# Patient Record
Sex: Female | Born: 1975 | Race: White | Hispanic: No | Marital: Married | Smoking: Former smoker
Health system: Southern US, Community
[De-identification: ages and names within clinical notes are randomized; demographics above are authoritative.]

## PROBLEM LIST (undated history)

## (undated) DIAGNOSIS — E7800 Pure hypercholesterolemia, unspecified: Secondary | ICD-10-CM

## (undated) DIAGNOSIS — T8859XA Other complications of anesthesia, initial encounter: Secondary | ICD-10-CM

## (undated) DIAGNOSIS — I10 Essential (primary) hypertension: Secondary | ICD-10-CM

## (undated) DIAGNOSIS — N760 Acute vaginitis: Secondary | ICD-10-CM

## (undated) DIAGNOSIS — Z889 Allergy status to unspecified drugs, medicaments and biological substances status: Secondary | ICD-10-CM

## (undated) DIAGNOSIS — F909 Attention-deficit hyperactivity disorder, unspecified type: Secondary | ICD-10-CM

## (undated) DIAGNOSIS — Z5189 Encounter for other specified aftercare: Secondary | ICD-10-CM

## (undated) DIAGNOSIS — J45909 Unspecified asthma, uncomplicated: Secondary | ICD-10-CM

## (undated) DIAGNOSIS — D649 Anemia, unspecified: Secondary | ICD-10-CM

## (undated) DIAGNOSIS — I7300 Raynaud's syndrome without gangrene: Secondary | ICD-10-CM

## (undated) DIAGNOSIS — T4145XA Adverse effect of unspecified anesthetic, initial encounter: Secondary | ICD-10-CM

## (undated) DIAGNOSIS — T7840XA Allergy, unspecified, initial encounter: Secondary | ICD-10-CM

## (undated) DIAGNOSIS — M797 Fibromyalgia: Secondary | ICD-10-CM

## (undated) DIAGNOSIS — R52 Pain, unspecified: Secondary | ICD-10-CM

## (undated) DIAGNOSIS — R011 Cardiac murmur, unspecified: Secondary | ICD-10-CM

## (undated) DIAGNOSIS — B9689 Other specified bacterial agents as the cause of diseases classified elsewhere: Secondary | ICD-10-CM

## (undated) DIAGNOSIS — L739 Follicular disorder, unspecified: Secondary | ICD-10-CM

## (undated) DIAGNOSIS — R6882 Decreased libido: Secondary | ICD-10-CM

## (undated) DIAGNOSIS — Z309 Encounter for contraceptive management, unspecified: Secondary | ICD-10-CM

## (undated) DIAGNOSIS — E669 Obesity, unspecified: Secondary | ICD-10-CM

## (undated) DIAGNOSIS — D8940 Mast cell activation, unspecified: Secondary | ICD-10-CM

## (undated) DIAGNOSIS — N898 Other specified noninflammatory disorders of vagina: Secondary | ICD-10-CM

## (undated) DIAGNOSIS — G43909 Migraine, unspecified, not intractable, without status migrainosus: Secondary | ICD-10-CM

## (undated) HISTORY — DX: Fibromyalgia: M79.7

## (undated) HISTORY — DX: Acute vaginitis: N76.0

## (undated) HISTORY — DX: Unspecified asthma, uncomplicated: J45.909

## (undated) HISTORY — DX: Mast cell activation, unspecified: D89.40

## (undated) HISTORY — DX: Encounter for contraceptive management, unspecified: Z30.9

## (undated) HISTORY — DX: Pure hypercholesterolemia, unspecified: E78.00

## (undated) HISTORY — DX: Migraine, unspecified, not intractable, without status migrainosus: G43.909

## (undated) HISTORY — DX: Allergy, unspecified, initial encounter: T78.40XA

## (undated) HISTORY — PX: BREAST EXCISIONAL BIOPSY: SUR124

## (undated) HISTORY — DX: Allergy status to unspecified drugs, medicaments and biological substances: Z88.9

## (undated) HISTORY — DX: Encounter for other specified aftercare: Z51.89

## (undated) HISTORY — DX: Cardiac murmur, unspecified: R01.1

## (undated) HISTORY — DX: Pain, unspecified: R52

## (undated) HISTORY — DX: Raynaud's syndrome without gangrene: I73.00

## (undated) HISTORY — DX: Decreased libido: R68.82

## (undated) HISTORY — DX: Obesity, unspecified: E66.9

## (undated) HISTORY — DX: Attention-deficit hyperactivity disorder, unspecified type: F90.9

## (undated) HISTORY — PX: BREAST LUMPECTOMY: SHX2

## (undated) HISTORY — DX: Other specified noninflammatory disorders of vagina: N89.8

## (undated) HISTORY — PX: KNEE ARTHROSCOPY: SUR90

## (undated) HISTORY — DX: Anemia, unspecified: D64.9

## (undated) HISTORY — DX: Follicular disorder, unspecified: L73.9

## (undated) HISTORY — DX: Other specified bacterial agents as the cause of diseases classified elsewhere: B96.89

## (undated) HISTORY — DX: Essential (primary) hypertension: I10

---

## 1997-09-24 ENCOUNTER — Ambulatory Visit (HOSPITAL_COMMUNITY): Admission: RE | Admit: 1997-09-24 | Discharge: 1997-09-24 | Payer: Self-pay | Admitting: *Deleted

## 2004-11-09 ENCOUNTER — Ambulatory Visit: Payer: Self-pay | Admitting: Orthopedic Surgery

## 2005-06-04 ENCOUNTER — Ambulatory Visit (HOSPITAL_COMMUNITY): Admission: RE | Admit: 2005-06-04 | Discharge: 2005-06-04 | Payer: Self-pay | Admitting: Family Medicine

## 2006-03-19 ENCOUNTER — Ambulatory Visit (HOSPITAL_COMMUNITY): Admission: RE | Admit: 2006-03-19 | Discharge: 2006-03-19 | Payer: Self-pay | Admitting: Family Medicine

## 2008-12-19 ENCOUNTER — Emergency Department (HOSPITAL_COMMUNITY): Admission: EM | Admit: 2008-12-19 | Discharge: 2008-12-19 | Payer: Self-pay | Admitting: Emergency Medicine

## 2009-05-21 HISTORY — PX: DILATION AND CURETTAGE OF UTERUS: SHX78

## 2009-10-09 ENCOUNTER — Inpatient Hospital Stay (HOSPITAL_COMMUNITY): Admission: AD | Admit: 2009-10-09 | Discharge: 2009-10-12 | Payer: Self-pay | Admitting: Obstetrics & Gynecology

## 2009-10-09 ENCOUNTER — Ambulatory Visit: Payer: Self-pay | Admitting: Obstetrics and Gynecology

## 2009-10-10 ENCOUNTER — Encounter: Payer: Self-pay | Admitting: Obstetrics & Gynecology

## 2010-08-07 LAB — RAPID URINE DRUG SCREEN, HOSP PERFORMED
Amphetamines: NOT DETECTED
Cocaine: NOT DETECTED
Tetrahydrocannabinol: NOT DETECTED

## 2010-08-07 LAB — CROSSMATCH

## 2010-08-07 LAB — COMPREHENSIVE METABOLIC PANEL
AST: 20 U/L (ref 0–37)
Albumin: 2.9 g/dL — ABNORMAL LOW (ref 3.5–5.2)
Alkaline Phosphatase: 94 U/L (ref 39–117)
Chloride: 106 mEq/L (ref 96–112)
GFR calc Af Amer: >60 mL/min (ref >60)
Potassium: 3.8 mEq/L (ref 3.5–5.1)
Total Bilirubin: 0.5 mg/dL (ref 0.3–1.2)

## 2010-08-07 LAB — CBC
HCT: 33.8 % — ABNORMAL LOW (ref 36.0–46.0)
HCT: 34.4 % — ABNORMAL LOW (ref 36.0–46.0)
Hemoglobin: 11.8 g/dL — ABNORMAL LOW (ref 12.0–15.0)
Hemoglobin: 11.9 g/dL — ABNORMAL LOW (ref 12.0–15.0)
Hemoglobin: 8.9 g/dL — ABNORMAL LOW (ref 12.0–15.0)
Hemoglobin: 9.3 g/dL — ABNORMAL LOW (ref 12.0–15.0)
MCHC: 34.1 g/dL (ref 30.0–36.0)
MCHC: 35 g/dL (ref 30.0–36.0)
MCV: 89.6 fL (ref 78.0–100.0)
MCV: 89.8 fL (ref 78.0–100.0)
Platelets: 130 10*3/uL — ABNORMAL LOW (ref 150–400)
Platelets: 136 10*3/uL — ABNORMAL LOW (ref 150–400)
RBC: 2.83 MIL/uL — ABNORMAL LOW (ref 3.87–5.11)
RBC: 3.04 MIL/uL — ABNORMAL LOW (ref 3.87–5.11)
RBC: 3.85 MIL/uL — ABNORMAL LOW (ref 3.87–5.11)
RDW: 14.2 % (ref 11.5–15.5)
WBC: 10.2 10*3/uL (ref 4.0–10.5)
WBC: 12.6 10*3/uL — ABNORMAL HIGH (ref 4.0–10.5)
WBC: 15.7 10*3/uL — ABNORMAL HIGH (ref 4.0–10.5)
WBC: 9.2 10*3/uL (ref 4.0–10.5)

## 2010-08-07 LAB — URINE MICROSCOPIC-ADD ON

## 2010-08-07 LAB — URINALYSIS, ROUTINE W REFLEX MICROSCOPIC
Leukocytes, UA: NEGATIVE
Nitrite: NEGATIVE
Specific Gravity, Urine: 1.01 (ref 1.005–1.030)
pH: 6.5 (ref 5.0–8.0)

## 2010-08-07 LAB — MAGNESIUM: Magnesium: 4.7 mg/dL — ABNORMAL HIGH (ref 1.5–2.5)

## 2010-08-07 LAB — RPR: RPR Ser Ql: NONREACTIVE

## 2011-08-20 ENCOUNTER — Other Ambulatory Visit (HOSPITAL_COMMUNITY)
Admission: RE | Admit: 2011-08-20 | Discharge: 2011-08-20 | Disposition: A | Discharge status: Home / Self Care | Payer: BC Managed Care – PPO | Source: Ambulatory Visit | Attending: Obstetrics and Gynecology | Admitting: Obstetrics and Gynecology

## 2011-08-20 ENCOUNTER — Other Ambulatory Visit: Payer: Self-pay | Admitting: Adult Health

## 2011-08-20 DIAGNOSIS — Z01419 Encounter for gynecological examination (general) (routine) without abnormal findings: Secondary | ICD-10-CM | POA: Insufficient documentation

## 2011-08-23 ENCOUNTER — Telehealth (HOSPITAL_COMMUNITY): Payer: Self-pay | Admitting: Dietician

## 2011-08-23 NOTE — Telephone Encounter (Signed)
Received referral from Dr. Neale Burly ([facility]) for dx: obesity on 08/23/11.

## 2011-08-24 NOTE — Telephone Encounter (Signed)
Appointment scheduled for 08/30/11 at 4:00 PM. Sent appointment confirmation to pt home via [place] [facility].

## 2011-08-30 ENCOUNTER — Encounter (HOSPITAL_COMMUNITY): Payer: Self-pay | Admitting: Dietician

## 2011-08-30 DIAGNOSIS — G43909 Migraine, unspecified, not intractable, without status migrainosus: Secondary | ICD-10-CM | POA: Insufficient documentation

## 2011-08-30 DIAGNOSIS — I10 Essential (primary) hypertension: Secondary | ICD-10-CM | POA: Insufficient documentation

## 2011-08-30 DIAGNOSIS — Z889 Allergy status to unspecified drugs, medicaments and biological substances status: Secondary | ICD-10-CM | POA: Insufficient documentation

## 2011-08-30 NOTE — Progress Notes (Signed)
Outpatient Initial Nutrition Assessment  Date:08/30/2011   Time: 4:00 PM  Referring Physician: Dr. Neale Burly ([facility]) Reason for Visit: obesity/ weight loss  Nutrition Assessment:  Height: 5\' 4"  (162.6 cm)   Weight: 175 lb (79.379 kg)   IBW: 120# %IBW: 146% UBW: 145#  %UBW: 121% Body mass index is 30.04 kg/(m^2).  Goal Weight: 145# Weight hx: Pt reports maintained a weight of 145# before becoming pregnant with her son, who is now 31 years old. Her highest weight was 195# at delivery of her son. After his birth, she has been able to maintain a weight of 165-175#, but is frustrated with her lack of weight loss despite interventions. 5 years ago she reports she was 125#, and slowly gain up to 150#.   Estimated nutritional needs: 1692-1846 kcals daily, 64-80 grams protein daily, 1.7-1.8 L fluid daily  PMH:  Past Medical History  Diagnosis Date  . Migraines   . HTN (hypertension)   . Multiple allergies     Medications:  Current Outpatient Rx  Name Route Sig Dispense Refill  . DEPO-PROVERA IM Intramuscular Inject into the muscle.    . ANTIHISTAMINE PO Oral Take by mouth.    Marland Kitchen VYVANSE PO Oral Take by mouth.    . METOCLOPRAMIDE HCL 10 MG PO TABS Oral Take 10 mg by mouth as needed.    Marland Kitchen ZONISAMIDE PO Oral Take by mouth.      Labs: CMP     Component Value Date/Time   NA 135 10/09/2009 1039   K 3.8 10/09/2009 1039   CL 106 10/09/2009 1039   CO2 22 10/09/2009 1039   GLUCOSE 90 10/09/2009 1039   BUN 7 10/09/2009 1039   CREATININE 0.55 10/09/2009 1039   CALCIUM 8.9 10/09/2009 1039   PROT 6.1 10/09/2009 1039   ALBUMIN 2.9* 10/09/2009 1039   AST 20 10/09/2009 1039   ALT 13 10/09/2009 1039   ALKPHOS 94 10/09/2009 1039   BILITOT 0.5 10/09/2009 1039   GFRNONAA >60 10/09/2009 1039   GFRAA  Value: >60        The eGFR has been calculated using the MDRD equation. This calculation has not been validated in all clinical situations. eGFR's persistently <60 mL/min signify possible Chronic  Kidney Disease. 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 She is married, but her husband is currently stationed in [place] for his job. Her family lives nearby. She reports her stress level at a 7, citing, work, family, and trying to juggle all her and her children's commitments while her husband is away as her main sources of stress. She is looking forward to the summer, as school will be out and her husband will be returning in a couple of weeks, which she reports will make her life less hectic. She copes with stress by spending time with her family.  Nutrition hx/habits: Ms. Rozycki desires weight loss. She reports she knows what to do and does not want to be told "the same things everyone else has told me". She reports 5 years ago she had chronic hives and discovered that she has multiple allergies (pitted fruits, tree  nuts, shellfish, coconut, aloe, dust, keflex, molds, percodan). She reports that she has learned to manage with these allergies, although it can be trying when she eats out. She reports that she was on Prednisone for 18 months and that was when she gained the bulk of her weight. She has always been active, doing yard work, Architect, Armed forces technical officer, but admits that she has not been exercising since her son was born, due to her schedule. She reports that she has ADHD and, due to her schedule, if she does an evening workout, she will be up all night. She complains that she gets 5 hours of sleep at the most. She reports that spending time with her family is more of a priority now. She drinks 1 soda daily and drinks water throughout the day. She likes sweets, but  has cut back. She orders take out once a week and does the majority of the cooking in the household. She refuses to complete a food diary, saying "that won't work for me; my brain is already going a million miles an hour and I don't want anything to add to it". She does not want to deprive herself of foods. She thinks that she would benefit more from a chef than an RD, as she really would like to learn how to cook new recipes and attend cooking classes.   Diet recall: 5 Am: water OR orange juice; 5:30-7:30: sipping on Coca-Cola; 9:45 AM: breakfast bar OR dried fruit strip; Lunch (10:15 AM): salad with fresh veggies and hard boiled egg OR healthy choice meal OR leftover pizza; Snack (1:20 PM): peanuts, fruit strip, bag of chips, Chobani yogurt; Dinner (5-6 PM): hamburgers (2), sweet potato fries; Snack: peanuts  Nutrition Diagnosis: Involuntary weight gain r/t disordered eating pattern, physical inactivity AEB 20.7% weight gain from UBW.   Nutrition Intervention: Nutrition rx: 1200 kcal NAS, no added sugar diet; 3 meals per day; 3 snacks per day; low calorie beverages only; 30 minutes physical activity daily  Education/Counseling Provided: Most of the visit was spent on strategies for behavior change. Discussed healthier snack options. Discussed ways to make water more appetizing by adding fruit slices or low calorie powdered drink mix. Discussed important of incorporating exercise to assist with weight loss. Discussed seeking out a support system. Discussed slow, moderate weight loss (1-2# per week). Encouraged pt to seek out community resources re: cooking classes. Encouraged pt to take advantage of time off during the summer and when her husband is home to develop healthier lifestyle habits.   Understanding, Motivation, Ability to Follow Recommendations: Expect fair compliance.   Monitoring and Evaluation: Goals: 1)1-2# weight loss per week; 2) 30 minutes physical activity daily  Recommendations:  1) For weight loss: 1192-1346 kcals daily; 2) Take advantage of time off during the summer to adapt healthier lifestyle habits; 3) Seek out community resources re: cooking classes. Will inform of upcoming cooking class at [facility].   F/U: PRN. Provided RD contact information.  Orlene Plum, [place]  08/30/2011  Time: 4:00 PM

## 2012-08-18 ENCOUNTER — Other Ambulatory Visit: Payer: Self-pay | Admitting: Adult Health

## 2012-11-10 ENCOUNTER — Ambulatory Visit (INDEPENDENT_AMBULATORY_CARE_PROVIDER_SITE_OTHER): Payer: BC Managed Care – PPO | Admitting: Nurse Practitioner

## 2012-11-10 ENCOUNTER — Other Ambulatory Visit: Payer: Self-pay | Admitting: *Deleted

## 2012-11-10 ENCOUNTER — Encounter: Payer: Self-pay | Admitting: Nurse Practitioner

## 2012-11-10 VITALS — BP 116/82 | HR 80 | Temp 98.6°F | Wt 186.0 lb

## 2012-11-10 DIAGNOSIS — M5431 Sciatica, right side: Secondary | ICD-10-CM

## 2012-11-10 DIAGNOSIS — M5430 Sciatica, unspecified side: Secondary | ICD-10-CM

## 2012-11-10 MED ORDER — PREDNISONE (PAK) 10 MG PO TABS: 10.0000 mg | ORAL_TABLET | Freq: Every day | ORAL | Status: DC

## 2012-11-10 MED ORDER — MOMETASONE FUROATE 0.1 % EX CREA: TOPICAL_CREAM | Freq: Every day | CUTANEOUS | Status: DC

## 2012-11-10 MED ORDER — ALBUTEROL SULFATE HFA 108 (90 BASE) MCG/ACT IN AERS: 2.0000 | INHALATION_SPRAY | Freq: Four times a day (QID) | RESPIRATORY_TRACT | Status: DC | PRN

## 2012-11-10 MED ORDER — EPINEPHRINE 0.3 MG/0.3ML IJ SOAJ: 0.3000 mg | Freq: Once | INTRAMUSCULAR | Status: DC

## 2012-11-10 NOTE — Patient Instructions (Signed)
Sciatica °Sciatica is pain, weakness, numbness, or tingling along the path of the sciatic nerve. The nerve starts in the lower back and runs down the back of each leg. The nerve controls the muscles in the lower leg and in the back of the knee, while also providing sensation to the back of the thigh, lower leg, and the sole of your foot. Sciatica is a symptom of another medical condition. For instance, nerve damage or certain conditions, such as a herniated disk or bone spur on the spine, pinch or put pressure on the sciatic nerve. This causes the pain, weakness, or other sensations normally associated with sciatica. Generally, sciatica only affects one side of the body. °CAUSES  °· Herniated or slipped disc. °· Degenerative disk disease. °· A pain disorder involving the narrow muscle in the buttocks (piriformis syndrome). °· Pelvic injury or fracture. °· Pregnancy. °· Tumor (rare). °SYMPTOMS  °Symptoms can vary from mild to very severe. The symptoms usually travel from the low back to the buttocks and down the back of the leg. Symptoms can include: °· Mild tingling or dull aches in the lower back, leg, or hip. °· Numbness in the back of the calf or sole of the foot. °· Burning sensations in the lower back, leg, or hip. °· Sharp pains in the lower back, leg, or hip. °· Leg weakness. °· Severe back pain inhibiting movement. °These symptoms may get worse with coughing, sneezing, laughing, or prolonged sitting or standing. Also, being overweight may worsen symptoms. °DIAGNOSIS  °Your caregiver will perform a physical exam to look for common symptoms of sciatica. He or she may ask you to do certain movements or activities that would trigger sciatic nerve pain. Other tests may be performed to find the cause of the sciatica. These may include: °· Blood tests. °· X-rays. °· Imaging tests, such as an MRI or CT scan. °TREATMENT  °Treatment is directed at the cause of the sciatic pain. Sometimes, treatment is not necessary  and the pain and discomfort goes away on its own. If treatment is needed, your caregiver may suggest: °· Over-the-counter medicines to relieve pain. °· Prescription medicines, such as anti-inflammatory medicine, muscle relaxants, or narcotics. °· Applying heat or ice to the painful area. °· Steroid injections to lessen pain, irritation, and inflammation around the nerve. °· Reducing activity during periods of pain. °· Exercising and stretching to strengthen your abdomen and improve flexibility of your spine. Your caregiver may suggest losing weight if the extra weight makes the back pain worse. °· Physical therapy. °· Surgery to eliminate what is pressing or pinching the nerve, such as a bone spur or part of a herniated disk. °HOME CARE INSTRUCTIONS  °· Only take over-the-counter or prescription medicines for pain or discomfort as directed by your caregiver. °· Apply ice to the affected area for 20 minutes, 3 4 times a day for the first 48 72 hours. Then try heat in the same way. °· Exercise, stretch, or perform your usual activities if these do not aggravate your pain. °· Attend physical therapy sessions as directed by your caregiver. °· Keep all follow-up appointments as directed by your caregiver. °· Do not wear high heels or shoes that do not provide proper support. °· Check your mattress to see if it is too soft. A firm mattress may lessen your pain and discomfort. °SEEK IMMEDIATE MEDICAL CARE IF:  °· You lose control of your bowel or bladder (incontinence). °· You have increasing weakness in the lower back,   pelvis, buttocks, or legs. °· You have redness or swelling of your back. °· You have a burning sensation when you urinate. °· You have pain that gets worse when you lie down or awakens you at night. °· Your pain is worse than you have experienced in the past. °· Your pain is lasting longer than 4 weeks. °· You are suddenly losing weight without reason. °MAKE SURE YOU: °· Understand these  instructions. °· Will watch your condition. °· Will get help right away if you are not doing well or get worse. °Document Released: 05/01/2001 Document Revised: 11/06/2011 Document Reviewed: 09/16/2011 °ExitCare® Patient Information ©2014 [employer]. ° °

## 2012-11-10 NOTE — Progress Notes (Signed)
  Subjective:    Patient ID: Deanna Friedman, female    DOB: Sep 24, 1975, 37 y.o.   MRN: [id number]  HPI Patient in C/O right big toe numbness- Started out as tightness in the heeel and progressed to numbness to toe- No pain in foot- Having tenderness in lower back. Pain radiating to butt cheek and down thigh.  No back pain with movement.    Review of Systems  All other systems reviewed and are negative.       Objective:   Physical Exam  Constitutional: She appears well-developed and well-nourished.  Cardiovascular: Normal rate and normal heart sounds.   Pulmonary/Chest: Effort normal and breath sounds normal.  Musculoskeletal:  FROM of hip knee and ankle right side without pain. FROM of lumbar spine without pain. (-) SLR bil  Neurological: She has normal reflexes.    BP 116/82  Pulse 80  Temp(Src) 98.6 F (37 C) (Oral)  Wt 186 lb (84.369 kg)  BMI 31.91 kg/m2       Assessment & Plan:  1. Sciatica of right side without back pain Moist heat to back Rest No heavy lifting - predniSONE (STERAPRED UNI-PAK) 10 MG tablet; Take 1 tablet (10 mg total) by mouth daily. As directed X 6 days  Dispense: 211 tablet; Refill: 0  Meds ordered this encounter  Medications  . losartan-hydrochlorothiazide (HYZAAR) 50-12.5 MG per tablet    Sig: Take 1 tablet by mouth daily.   . traMADol (ULTRAM) 50 MG tablet    Sig: Take 50 mg by mouth every 6 (six) hours as needed for pain.  Marland Kitchen ibuprofen (ADVIL,MOTRIN) 800 MG tablet    Sig: Take 800 mg by mouth every 8 (eight) hours as needed for pain.  . predniSONE (STERAPRED UNI-PAK) 10 MG tablet    Sig: Take 1 tablet (10 mg total) by mouth daily. As directed X 6 days    Dispense:  211 tablet    Refill:  0    Order Specific Question:  Supervising Provider    Answer:  Ernestina Penna [[id number]]  . EPINEPHrine (EPIPEN) 0.3 mg/0.3 mL DEVI    Sig: Inject 0.3 mLs (0.3 mg total) into the muscle once.    Dispense:  1 Device    Refill:  2    Order Specific  Question:  Supervising Provider    Answer:  Ernestina Penna [[id number]]  . albuterol (PROVENTIL HFA;VENTOLIN HFA) 108 (90 BASE) MCG/ACT inhaler    Sig: Inhale 2 puffs into the lungs every 6 (six) hours as needed for wheezing.    Dispense:  1 Inhaler    Refill:  2    Order Specific Question:  Supervising Provider    Answer:  Ernestina Penna [[id number]]  . mometasone (ELOCON) 0.1 % cream    Sig: Apply topically daily.    Dispense:  45 g    Refill:  1    Order Specific Question:  Supervising Provider    Answer:  Ernestina Penna [[id number]]   RTO prn Mary-Margaret Daphine Deutscher, FNP

## 2012-11-11 MED ORDER — MEDROXYPROGESTERONE ACETATE 150 MG/ML IM SUSP: 150.0000 mg | INTRAMUSCULAR | Status: DC

## 2012-11-17 ENCOUNTER — Telehealth: Payer: Self-pay | Admitting: Nurse Practitioner

## 2012-11-17 NOTE — Telephone Encounter (Signed)
Mmm to address 

## 2012-11-17 NOTE — Telephone Encounter (Signed)
Nothing else can do other than send to ortho to have nerve conduction study

## 2012-11-17 NOTE — Telephone Encounter (Signed)
Pt wants to wait a bit before going to ortho,(alot of cost involved).

## 2012-12-02 ENCOUNTER — Ambulatory Visit (INDEPENDENT_AMBULATORY_CARE_PROVIDER_SITE_OTHER): Payer: BC Managed Care – PPO | Admitting: Women's Health

## 2012-12-02 ENCOUNTER — Encounter: Payer: Self-pay | Admitting: Women's Health

## 2012-12-02 VITALS — BP 110/80 | Ht 63.0 in | Wt 189.5 lb

## 2012-12-02 DIAGNOSIS — L259 Unspecified contact dermatitis, unspecified cause: Secondary | ICD-10-CM

## 2012-12-02 MED ORDER — PREDNISONE 10 MG PO TABS: 40.0000 mg | ORAL_TABLET | Freq: Every day | ORAL | Status: DC

## 2012-12-02 NOTE — Progress Notes (Signed)
Subjective:     Patient ID: Deanna Friedman, female   DOB: 29-Dec-1975, 37 y.o.   MRN: [id number]  HPI Deanna Friedman is a 37 y.o. female who presents w/ report of generalized fine pruritic rash x few days since using shea butter lotion at school- reports it is also exacerbating her eczema. OTC relief measures not helping. Has multiple allergies, and states she generally has to take prednisone at this point. Denies wheezing or sob.   Review of Systems Pertinent +/- as above   Objective:   Physical Exam BP 110/80  Ht 5\' 3"  (1.6 m)  Wt 189 lb 8 oz (85.957 kg)  BMI 33.58 kg/m2  Generalized fine rash on bilateral arms  Assessment:  Contact dermatitis Multiple allergies Plan:  Discussed w/ JAG Prednisone 40mg  daily x 10d F/U prn  Deanna Friedman, CNM 12/02/2012 3:58 PM

## 2013-02-06 ENCOUNTER — Telehealth: Payer: Self-pay | Admitting: Nurse Practitioner

## 2013-02-06 NOTE — Telephone Encounter (Signed)
Pt notified that her last shot was in 1994.  She was cut at work (school).  She is going to have school nurse give her shot.

## 2013-02-16 ENCOUNTER — Other Ambulatory Visit: Payer: Self-pay | Admitting: Adult Health

## 2013-02-16 ENCOUNTER — Other Ambulatory Visit: Payer: Self-pay | Admitting: Obstetrics & Gynecology

## 2013-04-15 ENCOUNTER — Ambulatory Visit: Payer: BC Managed Care – PPO | Admitting: Adult Health

## 2013-05-11 ENCOUNTER — Encounter: Payer: Self-pay | Admitting: Adult Health

## 2013-05-11 ENCOUNTER — Ambulatory Visit (INDEPENDENT_AMBULATORY_CARE_PROVIDER_SITE_OTHER): Payer: BC Managed Care – PPO | Admitting: Adult Health

## 2013-05-11 VITALS — BP 120/84 | Ht 64.0 in | Wt 196.0 lb

## 2013-05-11 DIAGNOSIS — R51 Headache: Secondary | ICD-10-CM

## 2013-05-11 DIAGNOSIS — R519 Headache, unspecified: Secondary | ICD-10-CM

## 2013-05-11 NOTE — Progress Notes (Signed)
Subjective:     Patient ID: Deanna Friedman, female   DOB: 1976-04-18, 37 y.o.   MRN: [id number]  HPI Paysley is a 37 year old white female,married in complaining of having a pounding sensation in head upon standing.It does not happen every time and has been happening about 2-3 months now. No new medication or contact with chemicals.Denies vision loss or dizziness,also complains of can't lose weight.Has history of migraines.she is using depo for contraception.  Review of Systems See HPI Reviewed past medical,surgical, social and family history. Reviewed medications and allergies.     Objective:   Physical Exam BP 120/84  Ht 5\' 4"  (1.626 m)  Wt 196 lb (88.905 kg)  BMI 33.63 kg/m2   BP recheck lying 118/78,sitting 122/78 and standing 110/80, CN 2-12 intact, good gait, no weakness  Assessment:     Headache    Plan:     See Dr Gerilyn Pilgrim in follow up,?atypical migraine Get fasting labs in near future Call prn review handout on headaches

## 2013-05-11 NOTE — Patient Instructions (Signed)

## 2013-05-18 ENCOUNTER — Other Ambulatory Visit: Payer: BC Managed Care – PPO

## 2013-05-18 DIAGNOSIS — Z13220 Encounter for screening for lipoid disorders: Secondary | ICD-10-CM

## 2013-05-18 DIAGNOSIS — Z0000 Encounter for general adult medical examination without abnormal findings: Secondary | ICD-10-CM

## 2013-05-18 DIAGNOSIS — Z1329 Encounter for screening for other suspected endocrine disorder: Secondary | ICD-10-CM

## 2013-05-18 LAB — COMPREHENSIVE METABOLIC PANEL
ALT: 28 U/L (ref 0–35)
AST: 19 U/L (ref 0–37)
CO2: 27 mEq/L (ref 19–32)
Calcium: 8.6 mg/dL (ref 8.4–10.5)
Chloride: 105 mEq/L (ref 96–112)
Creat: 0.76 mg/dL (ref 0.50–1.10)
Sodium: 141 mEq/L (ref 135–145)
Total Bilirubin: 0.5 mg/dL (ref 0.3–1.2)
Total Protein: 6.3 g/dL (ref 6.0–8.3)

## 2013-05-18 LAB — LIPID PANEL
Cholesterol: 134 mg/dL (ref 0–200)
Total CHOL/HDL Ratio: 3.8 Ratio
VLDL: 33 mg/dL (ref 0–40)

## 2013-05-18 LAB — TSH: TSH: 1.308 u[IU]/mL (ref 0.350–4.500)

## 2013-05-18 LAB — CBC
Platelets: 208 10*3/uL (ref 150–400)
RBC: 4.38 MIL/uL (ref 3.87–5.11)
RDW: 14 % (ref 11.5–15.5)
WBC: 4.2 10*3/uL (ref 4.0–10.5)

## 2013-05-20 ENCOUNTER — Telehealth: Payer: Self-pay | Admitting: Adult Health

## 2013-05-20 NOTE — Telephone Encounter (Signed)
Pt aware of labs  

## 2013-05-25 ENCOUNTER — Other Ambulatory Visit: Payer: Self-pay | Admitting: Adult Health

## 2013-10-14 ENCOUNTER — Ambulatory Visit (INDEPENDENT_AMBULATORY_CARE_PROVIDER_SITE_OTHER): Payer: BC Managed Care – PPO | Admitting: Adult Health

## 2013-10-14 ENCOUNTER — Encounter: Payer: Self-pay | Admitting: Adult Health

## 2013-10-14 VITALS — BP 128/78 | Ht 63.5 in | Wt 198.0 lb

## 2013-10-14 DIAGNOSIS — N898 Other specified noninflammatory disorders of vagina: Secondary | ICD-10-CM

## 2013-10-14 DIAGNOSIS — Z6836 Body mass index (BMI) 36.0-36.9, adult: Secondary | ICD-10-CM

## 2013-10-14 DIAGNOSIS — A499 Bacterial infection, unspecified: Secondary | ICD-10-CM

## 2013-10-14 DIAGNOSIS — E6609 Other obesity due to excess calories: Secondary | ICD-10-CM | POA: Insufficient documentation

## 2013-10-14 DIAGNOSIS — E669 Obesity, unspecified: Secondary | ICD-10-CM

## 2013-10-14 DIAGNOSIS — N760 Acute vaginitis: Secondary | ICD-10-CM

## 2013-10-14 DIAGNOSIS — B9689 Other specified bacterial agents as the cause of diseases classified elsewhere: Secondary | ICD-10-CM

## 2013-10-14 HISTORY — DX: Obesity, unspecified: E66.9

## 2013-10-14 HISTORY — DX: Other specified bacterial agents as the cause of diseases classified elsewhere: B96.89

## 2013-10-14 LAB — POCT WET PREP (WET MOUNT): WBC, Wet Prep HPF POC: POSITIVE

## 2013-10-14 MED ORDER — METRONIDAZOLE 500 MG PO TABS: 500.0000 mg | ORAL_TABLET | Freq: Two times a day (BID) | ORAL | Status: DC

## 2013-10-14 MED ORDER — PHENTERMINE HCL 37.5 MG PO CAPS: 37.5000 mg | ORAL_CAPSULE | ORAL | Status: DC

## 2013-10-14 NOTE — Patient Instructions (Signed)
Calorie Counting Diet A calorie counting diet requires you to eat the number of calories that are right for you in a day. Calories are the measurement of how much energy you get from the food you eat. Eating the right amount of calories is important for staying at a healthy weight. If you eat too many calories, your body will store them as fat and you may gain weight. If you eat too few calories, you may lose weight. Counting the number of calories you eat during a day will help you know if you are eating the right amount. A Registered Dietitian can determine how many calories you need in a day. The amount of calories needed varies from person to person. If your goal is to lose weight, you will need to eat fewer calories. Losing weight can benefit you if you are overweight or have health problems such as heart disease, high blood pressure, or diabetes. If your goal is to gain weight, you will need to eat more calories. Gaining weight may be necessary if you have a certain health problem that causes your body to need more energy. TIPS Whether you are increasing or decreasing the number of calories you eat during a day, it may be hard to get used to changes in what you eat and drink. The following are tips to help you keep track of the number of calories you eat.  Measure foods at home with measuring cups. This helps you know the amount of food and number of calories you are eating.  Restaurants often serve food in amounts that are larger than 1 serving. While eating out, estimate how many servings of a food you are given. For example, a serving of cooked rice is  cup or about the size of half of a fist. Knowing serving sizes will help you be aware of how much food you are eating at restaurants.  Ask for smaller portion sizes or child-size portions at restaurants.  Plan to eat half of a meal at a restaurant. Take the rest home or share the other half with a friend.  Read the Nutrition Facts panel on  food labels for calorie content and serving size. You can find out how many servings are in a package, the size of a serving, and the number of calories each serving has.  For example, a package might contain 3 cookies. The Nutrition Facts panel on that package says that 1 serving is 1 cookie. Below that, it will say there are 3 servings in the container. The calories section of the Nutrition Facts label says there are 90 calories. This means there are 90 calories in 1 cookie (1 serving). If you eat 1 cookie you have eaten 90 calories. If you eat all 3 cookies, you have eaten 270 calories (3 servings x 90 calories = 270 calories). The list below tells you how big or small some common portion sizes are.  1 oz.........4 stacked dice.  3 oz.........Deck of cards.  1 tsp........Tip of little finger.  1 tbs........Thumb.  2 tbs........Golf ball.   cup.......Half of a fist.  1 cup........A fist. KEEP A FOOD LOG Write down every food item you eat, the amount you eat, and the number of calories in each food you eat during the day. At the end of the day, you can add up the total number of calories you have eaten. It may help to keep a list like the one below. Find out the calorie information by reading the   Nutrition Facts panel on food labels. Breakfast  Bran cereal (1 cup, 110 calories).  Fat-free milk ( cup, 45 calories). Snack  Apple (1 medium, 80 calories). Lunch  Spinach (1 cup, 20 calories).  Tomato ( medium, 20 calories).  Chicken breast strips (3 oz, 165 calories).  Shredded cheddar cheese ( cup, 110 calories).  Light [place] dressing (2 tbs, 60 calories).  Whole-wheat bread (1 slice, 80 calories).  Tub margarine (1 tsp, 35 calories).  Vegetable soup (1 cup, 160 calories). Dinner  Pork chop (3 oz, 190 calories).  Brown rice (1 cup, 215 calories).  Steamed broccoli ( cup, 20 calories).  Strawberries (1  cup, 65 calories).  Whipped cream (1 tbs, 50  calories). Daily Calorie Total: 1425 Document Released: 05/07/2005 Document Revised: 07/30/2011 Document Reviewed: 11/01/2006 [facility] Patient Information 2014 [place], [place]. Bacterial Vaginosis Bacterial vaginosis is a vaginal infection that occurs when the normal balance of bacteria in the vagina is disrupted. It results from an overgrowth of certain bacteria. This is the most common vaginal infection in women of childbearing age. Treatment is important to prevent complications, especially in pregnant women, as it can cause a premature delivery. CAUSES  Bacterial vaginosis is caused by an increase in harmful bacteria that are normally present in smaller amounts in the vagina. Several different kinds of bacteria can cause bacterial vaginosis. However, the reason that the condition develops is not fully understood. RISK FACTORS Certain activities or behaviors can put you at an increased risk of developing bacterial vaginosis, including:  Having a new sex partner or multiple sex partners.  Douching.  Using an intrauterine device (IUD) for contraception. Women do not get bacterial vaginosis from toilet seats, bedding, swimming pools, or contact with objects around them. SIGNS AND SYMPTOMS  Some women with bacterial vaginosis have no signs or symptoms. Common symptoms include:  Grey vaginal discharge.  A fishlike odor with discharge, especially after sexual intercourse.  Itching or burning of the vagina and vulva.  Burning or pain with urination. DIAGNOSIS  Your health care provider will take a medical history and examine the vagina for signs of bacterial vaginosis. A sample of vaginal fluid may be taken. Your health care provider will look at this sample under a microscope to check for bacteria and abnormal cells. A vaginal pH test may also be done.  TREATMENT  Bacterial vaginosis may be treated with antibiotic medicines. These may be given in the form of a pill or a vaginal cream. A  second round of antibiotics may be prescribed if the condition comes back after treatment.  HOME CARE INSTRUCTIONS   Only take over-the-counter or prescription medicines as directed by your health care provider.  If antibiotic medicine was prescribed, take it as directed. Make sure you finish it even if you start to feel better.  Do not have sex until treatment is completed.  Tell all sexual partners that you have a vaginal infection. They should see their health care provider and be treated if they have problems, such as a mild rash or itching.  Practice safe sex by using condoms and only having one sex partner. SEEK MEDICAL CARE IF:   Your symptoms are not improving after 3 days of treatment.  You have increased discharge or pain.  You have a fever. MAKE SURE YOU:   Understand these instructions.  Will watch your condition.  Will get help right away if you are not doing well or get worse. FOR MORE INFORMATION  Centers for Disease Control and  Prevention, Division of STD Prevention: SolutionApps.co.za American Sexual Health Association (ASHA): www.ashastd.org  Document Released: 05/07/2005 Document Revised: 02/25/2013 Document Reviewed: 12/17/2012 [facility] Patient Information 2014 [place], [place]. Follow up in 4 weeks No alcohol

## 2013-10-14 NOTE — Progress Notes (Signed)
Subjective:     Patient ID: Deanna Friedman, female   DOB: March 23, 1976, 38 y.o.   MRN: [id number]  HPI Deanna Friedman is a 38 year old white female in complaining of vaginal itch and odor and can not lose weight, feeling bad about self thinks about it weight all the time.  Review of Systems See HPI Reviewed past medical,surgical, social and family history. Reviewed medications and allergies.     Objective:   Physical Exam BP 128/78  Ht 5' 3.5" (1.613 m)  Wt 198 lb (89.812 kg)  BMI 34.52 kg/m2   Skin warm and dry.Pelvic: external genitalia is normal in appearance, vagina: white discharge with odor, cervix:smooth and bulbous, uterus: normal size, shape and contour, non tender, no masses felt, adnexa: no masses or tenderness noted. Wet prep: + for clue cells and +WBCs. Lungs: clear to ausculation bilaterally. Cardiovascular: regular rate and rhythm. Discussed adipex risk and benefits and she wants to try it.  Assessment:     Vaginal discharge BV    Obesity  Plan:     Rx flagyl 500 mg 1 bid x 7 days, no alcohol, review handout on BV   Rx adipex 37.5 mg #30 1 daily no refills Follow up in 4 weeks for weight and BP check Review handout on weight loss

## 2013-10-28 ENCOUNTER — Other Ambulatory Visit: Payer: Self-pay | Admitting: Obstetrics & Gynecology

## 2013-11-11 ENCOUNTER — Encounter: Payer: Self-pay | Admitting: Adult Health

## 2013-11-11 ENCOUNTER — Ambulatory Visit (INDEPENDENT_AMBULATORY_CARE_PROVIDER_SITE_OTHER): Payer: BC Managed Care – PPO | Admitting: Adult Health

## 2013-11-11 VITALS — BP 124/70 | Ht 63.0 in | Wt 192.0 lb

## 2013-11-11 DIAGNOSIS — E669 Obesity, unspecified: Secondary | ICD-10-CM

## 2013-11-11 MED ORDER — PHENTERMINE HCL 37.5 MG PO CAPS: 37.5000 mg | ORAL_CAPSULE | ORAL | Status: DC

## 2013-11-11 NOTE — Progress Notes (Signed)
Subjective:     Patient ID: Deanna Friedman, female   DOB: 1975/06/27, 38 y.o.   MRN: [id number]  HPI Deanna Friedman is a 38 year old white female in for weight and BP check, she has been on adipex and has felt good and has lost weight.No complaints today.  Review of Systems See HPI  Reviewed past medical,surgical, social and family history. Reviewed medications and allergies.     Objective:   Physical Exam BP 124/70  Ht 5\' 3"  (1.6 m)  Wt 192 lb (87.091 kg)  BMI 34.02 kg/m2   Skin warm and dry. Lungs: clear to ausculation bilaterally. Cardiovascular: regular rate and rhythm., Has lost 6 lbs. And feels good.  Assessment:     Obesity Weight loss management    Plan:     Rx adipex 37.5 mg #30 1 daily, no refills Follow up in 4 weeks Continue weight loss efforts

## 2013-11-11 NOTE — Patient Instructions (Signed)
Continue weight loss efforts Follow up in 4 weeks 

## 2013-11-27 ENCOUNTER — Other Ambulatory Visit: Payer: Self-pay | Admitting: *Deleted

## 2013-11-27 NOTE — Telephone Encounter (Signed)
Last seen 12/2012 

## 2013-11-28 ENCOUNTER — Other Ambulatory Visit: Payer: Self-pay | Admitting: Nurse Practitioner

## 2013-11-28 MED ORDER — EPINEPHRINE 0.3 MG/0.3ML IJ SOAJ: 0.3000 mg | Freq: Once | INTRAMUSCULAR | Status: DC

## 2013-12-14 ENCOUNTER — Ambulatory Visit (INDEPENDENT_AMBULATORY_CARE_PROVIDER_SITE_OTHER): Payer: BC Managed Care – PPO | Admitting: Adult Health

## 2013-12-14 ENCOUNTER — Encounter (INDEPENDENT_AMBULATORY_CARE_PROVIDER_SITE_OTHER): Payer: BC Managed Care – PPO | Admitting: Adult Health

## 2013-12-14 ENCOUNTER — Encounter: Payer: Self-pay | Admitting: Adult Health

## 2013-12-14 VITALS — BP 130/78 | HR 78 | Ht 63.0 in | Wt 189.0 lb

## 2013-12-14 DIAGNOSIS — E669 Obesity, unspecified: Secondary | ICD-10-CM

## 2013-12-14 MED ORDER — PHENTERMINE HCL 37.5 MG PO CAPS: 37.5000 mg | ORAL_CAPSULE | ORAL | Status: DC

## 2013-12-14 NOTE — Patient Instructions (Signed)
continue weight loss efforts and increase exercise Follow up in  4 weeks

## 2013-12-14 NOTE — Progress Notes (Signed)
Subjective:     Patient ID: Margaretmary BayleyJodi H Capes, female   DOB: 1975/12/31, 38 y.o.   MRN: [id number]  HPI Lennox LaityJodi is a 38 year old white female in for weight and BP check, has lost another 3 lbs even went to beach, for a total of 9 lbs.  Review of Systems See HPI Reviewed past medical,surgical, social and family history. Reviewed medications and allergies.     Objective:   Physical Exam BP 130/78  Pulse 78  Ht 5\' 3"  (1.6 m)  Wt 189 lb (85.73 kg)  BMI 33.49 kg/m2   Skin warm and dry.Lungs: clear to ausculation bilaterally. Cardiovascular: regular rate and rhythm.she is going to gym and has 90 day challenge now.  Assessment:    Obesity, weight management    Plan:    Rx adipex 37.5 mg #30 1 daily no refills Follow up in  4 weeks Increase exercise and continue weight loss efforts

## 2014-01-06 ENCOUNTER — Ambulatory Visit (INDEPENDENT_AMBULATORY_CARE_PROVIDER_SITE_OTHER): Payer: BC Managed Care – PPO | Admitting: Adult Health

## 2014-01-06 ENCOUNTER — Encounter: Payer: Self-pay | Admitting: Adult Health

## 2014-01-06 VITALS — BP 134/80 | HR 78 | Ht 63.0 in | Wt 189.0 lb

## 2014-01-06 DIAGNOSIS — E669 Obesity, unspecified: Secondary | ICD-10-CM

## 2014-01-06 NOTE — Patient Instructions (Signed)
Try melatonin Return in 3 months for pap and physical Try Whole 30

## 2014-01-06 NOTE — Progress Notes (Signed)
Subjective:     Patient ID: Deanna BayleyJodi H Friedman, female   DOB: 1975-08-24, 38 y.o.   MRN: [id number]  HPI Lennox LaityJodi is a 38 year old white female in for weight and BP check on used adipex for 3 months and has lost 9 lbs and is working out, mind racing at night, so not sleeping as well.Will take a break off adipex.  Review of Systems See HPI Reviewed past medical,surgical, social and family history. Reviewed medications and allergies.     Objective:   Physical Exam BP 134/80  Pulse 78  Ht 5\' 3"  (1.6 m)  Wt 189 lb (85.73 kg)  BMI 33.49 kg/m2   Skin warm and dry.  Lungs: clear to ausculation bilaterally. Cardiovascular: regular rate and rhythm. Discussed trying other options for weight loss.   Assessment:     Obesity     Plan:     Try Whole 30 Continue weight loss and exercise Return in 3 months for pap and physical Review handout on Contrave Can try melatonin

## 2014-01-08 ENCOUNTER — Ambulatory Visit: Payer: BC Managed Care – PPO | Admitting: Adult Health

## 2014-01-21 ENCOUNTER — Telehealth: Payer: Self-pay | Admitting: *Deleted

## 2014-01-21 MED ORDER — FLUCONAZOLE 150 MG PO TABS: ORAL_TABLET | ORAL | Status: DC

## 2014-01-21 NOTE — Telephone Encounter (Signed)
Pt c/o cottage cheese discharge, requesting diflucan.

## 2014-01-21 NOTE — Telephone Encounter (Signed)
Diflucan  #1 with 2RF

## 2014-03-22 ENCOUNTER — Encounter: Payer: Self-pay | Admitting: Adult Health

## 2014-03-31 ENCOUNTER — Ambulatory Visit (INDEPENDENT_AMBULATORY_CARE_PROVIDER_SITE_OTHER): Payer: BC Managed Care – PPO | Admitting: Adult Health

## 2014-03-31 ENCOUNTER — Other Ambulatory Visit (HOSPITAL_COMMUNITY)
Admission: RE | Admit: 2014-03-31 | Discharge: 2014-03-31 | Disposition: A | Discharge status: Home / Self Care | Payer: BC Managed Care – PPO | Source: Ambulatory Visit | Attending: Adult Health | Admitting: Adult Health

## 2014-03-31 ENCOUNTER — Encounter: Payer: Self-pay | Admitting: Adult Health

## 2014-03-31 VITALS — BP 110/72 | HR 70 | Ht 64.0 in | Wt 193.0 lb

## 2014-03-31 DIAGNOSIS — Z309 Encounter for contraceptive management, unspecified: Secondary | ICD-10-CM | POA: Insufficient documentation

## 2014-03-31 DIAGNOSIS — Z1151 Encounter for screening for human papillomavirus (HPV): Secondary | ICD-10-CM | POA: Insufficient documentation

## 2014-03-31 DIAGNOSIS — Z1212 Encounter for screening for malignant neoplasm of rectum: Secondary | ICD-10-CM

## 2014-03-31 DIAGNOSIS — Z01419 Encounter for gynecological examination (general) (routine) without abnormal findings: Secondary | ICD-10-CM | POA: Diagnosis present

## 2014-03-31 DIAGNOSIS — Z3042 Encounter for surveillance of injectable contraceptive: Secondary | ICD-10-CM

## 2014-03-31 HISTORY — DX: Encounter for contraceptive management, unspecified: Z30.9

## 2014-03-31 LAB — HEMOCCULT GUIAC POC 1CARD (OFFICE): Fecal Occult Blood, POC: NEGATIVE

## 2014-03-31 NOTE — Progress Notes (Signed)
Patient ID: Deanna BayleyJodi H Friedman, female   DOB: 14-Mar-1976, 38 y.o.   MRN: [id number] History of Present Illness:  Deanna LaityJodi is a 38 year old white female, married in for a pap and physical.She is on atenolol for headaches by Dr Gerilyn [place] and says her headaches are better but she feels terrible.She is happy with depo.  Current Medications, Allergies, Past Medical History, Past Surgical History, Family History and Social History were reviewed in [facility] electronic medical record.     Review of Systems: Patient denies any increased headaches, blurred vision, shortness of breath, chest pain, abdominal pain, problems with bowel movements, urination, or intercourse. No joint pain or mood swings,see HPI.    Physical Exam:BP 110/72 mmHg  Pulse 70  Ht 5\' 4"  (1.626 m)  Wt 193 lb (87.544 kg)  BMI 33.11 kg/m2 General:  Well developed, well nourished, no acute distress Skin:  Warm and dry Neck:  Midline trachea, normal thyroid Lungs; Clear to auscultation bilaterally Breast:  No dominant palpable mass, retraction, or nipple discharge Cardiovascular: Regular rate and rhythm Abdomen:  Soft, non tender, no hepatosplenomegaly Pelvic:  External genitalia is normal in appearance.  The vagina is normal in appearance.  The cervix is bulbous.Pap with HPV performed.  Uterus is felt to be normal size, shape, and contour.  No                adnexal masses or tenderness noted. Rectal: Good sphincter tone, no polyps, or hemorrhoids felt.  Hemoccult negative.mild low rectocele Extremities:  No swelling or varicosities noted Psych:  No mood changes,alert and cooperative,seems happy but subdued, told her when she sees Dr Gerilyn [place] 12/3 to tell him how she feels   Impression: Well woman exam with pap Contraceptive management    Plan: Physical in 1 year Mammogram now and yearly  Continue depo has refills

## 2014-03-31 NOTE — Patient Instructions (Signed)
Physical in 1 year Mammogram NOW

## 2014-04-01 LAB — CYTOLOGY - PAP

## 2014-04-26 ENCOUNTER — Telehealth: Payer: Self-pay | Admitting: *Deleted

## 2014-04-26 MED ORDER — FLUCONAZOLE 150 MG PO TABS: ORAL_TABLET | ORAL | Status: DC

## 2014-04-26 NOTE — Telephone Encounter (Signed)
[place] going [place] and is requesting Diflucan to have on hand. Thanks! JSY

## 2014-04-26 NOTE — Telephone Encounter (Signed)
Will rx diflucan  

## 2014-06-01 ENCOUNTER — Other Ambulatory Visit: Payer: Self-pay | Admitting: Adult Health

## 2014-08-17 ENCOUNTER — Encounter: Payer: Self-pay | Admitting: Adult Health

## 2014-08-17 ENCOUNTER — Ambulatory Visit (INDEPENDENT_AMBULATORY_CARE_PROVIDER_SITE_OTHER): Payer: BC Managed Care – PPO | Admitting: Adult Health

## 2014-08-17 VITALS — BP 110/70 | HR 100 | Ht 63.0 in | Wt 201.0 lb

## 2014-08-17 DIAGNOSIS — E669 Obesity, unspecified: Secondary | ICD-10-CM | POA: Diagnosis not present

## 2014-08-17 MED ORDER — PHENTERMINE HCL 37.5 MG PO CAPS: 37.5000 mg | ORAL_CAPSULE | ORAL | Status: DC

## 2014-08-17 MED ORDER — ALBUTEROL SULFATE HFA 108 (90 BASE) MCG/ACT IN AERS: 2.0000 | INHALATION_SPRAY | Freq: Four times a day (QID) | RESPIRATORY_TRACT | Status: DC | PRN

## 2014-08-17 NOTE — Patient Instructions (Signed)
Continue exercise  Try adipex  Return in 4 weeks for weight and bP check try to lose 4 lbs in 4 weeks

## 2014-08-17 NOTE — Progress Notes (Signed)
Subjective:     Patient ID: Deanna BayleyJodi H Friedman, female   DOB: [birth year], 39 y.o.   MRN: [id number]  HPI Lennox LaityJodi is a 39 year old white female in to discuss weight loss, has used adipex in past with good results.  Review of Systems  Patient denies any daily headaches, hearing loss, fatigue, blurred vision, shortness of breath, chest pain, abdominal pain, problems with bowel movements, urination, or intercourse. No joint pain or mood swings. Reviewed past medical,surgical, social and family history. Reviewed medications and allergies.     Objective:   Physical Exam BP 110/70 mmHg  Pulse 100  Ht 5\' 3"  (1.6 m)  Wt 201 lb (91.173 kg)  BMI 35.61 kg/m2   Skin warm and dry. Neck: mid line trachea, normal thyroid, good ROM, no lymphadenopathy noted. Lungs: clear to ausculation bilaterally. Cardiovascular: regular rate and rhythm.Discussed diet and exercise.  Assessment:     Obesity     Plan:    Portion control and decrease carbs Rx adipex 37.5 mg #30 1 daily no refills Return in 4 weeks for weight and BP check Continue exercise and try to lose 4 lbs in 4 weeks  Refilled albuterol inhaler x 2

## 2014-09-15 ENCOUNTER — Ambulatory Visit: Payer: BC Managed Care – PPO | Admitting: Adult Health

## 2014-09-21 ENCOUNTER — Encounter: Payer: Self-pay | Admitting: Adult Health

## 2014-09-21 ENCOUNTER — Ambulatory Visit (INDEPENDENT_AMBULATORY_CARE_PROVIDER_SITE_OTHER): Payer: BC Managed Care – PPO | Admitting: Adult Health

## 2014-09-21 VITALS — BP 110/80 | HR 100 | Ht 64.0 in | Wt 204.0 lb

## 2014-09-21 DIAGNOSIS — E669 Obesity, unspecified: Secondary | ICD-10-CM | POA: Diagnosis not present

## 2014-09-21 MED ORDER — PHENTERMINE HCL 37.5 MG PO CAPS: 37.5000 mg | ORAL_CAPSULE | ORAL | Status: DC

## 2014-09-21 NOTE — Patient Instructions (Signed)
Continue weight loss  follow up in 4 weeks Increase exercise  Decrease carbs and increase water  Whole 30

## 2014-09-21 NOTE — Progress Notes (Signed)
Subjective:     Patient ID: Deanna BayleyJodi H Friedman, female   DOB: 01-Apr-1976, 39 y.o.   MRN: [id number]  HPI Deanna Friedman is a 39 year old white female in for weight and BP check, she has allergies and is taking OTC meds.  Review of Systems  Weight gain, has allergies, all other systems negative Reviewed past medical,surgical, social and family history. Reviewed medications and allergies.     Objective:   Physical Exam BP 110/80 mmHg  Pulse 100  Ht 5\' 4"  (1.626 m)  Wt 204 lb (92.534 kg)  BMI 35.00 kg/m2   Skin warm and dry. Neck: mid line trachea, normal thyroid, good ROM, no lymphadenopathy noted. Lungs: clear to ausculation bilaterally. Cardiovascular: regular rate and rhythm. Gained 3 lbs, but is exercising.Discussed eating cleaner.  Assessment:     Obesity     Plan:     Rx adipex 37.5 mg #30 take 1 daily Follow up in 4 weeks Increase exercise,decrease carbs, try whole 30

## 2014-09-22 ENCOUNTER — Ambulatory Visit: Payer: BC Managed Care – PPO | Admitting: Adult Health

## 2014-10-04 ENCOUNTER — Other Ambulatory Visit: Payer: Self-pay | Admitting: Adult Health

## 2014-10-07 ENCOUNTER — Other Ambulatory Visit: Payer: Self-pay | Admitting: Obstetrics & Gynecology

## 2014-10-14 ENCOUNTER — Ambulatory Visit (INDEPENDENT_AMBULATORY_CARE_PROVIDER_SITE_OTHER): Payer: BC Managed Care – PPO | Admitting: Nurse Practitioner

## 2014-10-14 ENCOUNTER — Encounter: Payer: Self-pay | Admitting: Nurse Practitioner

## 2014-10-14 VITALS — BP 124/84 | HR 121 | Temp 98.0°F | Ht 64.0 in | Wt 200.0 lb

## 2014-10-14 DIAGNOSIS — I10 Essential (primary) hypertension: Secondary | ICD-10-CM

## 2014-10-14 DIAGNOSIS — F902 Attention-deficit hyperactivity disorder, combined type: Secondary | ICD-10-CM | POA: Diagnosis not present

## 2014-10-14 DIAGNOSIS — Z889 Allergy status to unspecified drugs, medicaments and biological substances status: Secondary | ICD-10-CM

## 2014-10-14 DIAGNOSIS — G43111 Migraine with aura, intractable, with status migrainosus: Secondary | ICD-10-CM

## 2014-10-14 DIAGNOSIS — Z9109 Other allergy status, other than to drugs and biological substances: Secondary | ICD-10-CM | POA: Diagnosis not present

## 2014-10-14 DIAGNOSIS — E669 Obesity, unspecified: Secondary | ICD-10-CM | POA: Diagnosis not present

## 2014-10-14 MED ORDER — LISDEXAMFETAMINE DIMESYLATE 40 MG PO CAPS: 40.0000 mg | ORAL_CAPSULE | ORAL | Status: DC

## 2014-10-14 MED ORDER — BECLOMETHASONE DIPROPIONATE 40 MCG/ACT IN AERS: 2.0000 | INHALATION_SPRAY | Freq: Every day | RESPIRATORY_TRACT | Status: DC

## 2014-10-14 MED ORDER — ALBUTEROL SULFATE HFA 108 (90 BASE) MCG/ACT IN AERS: 2.0000 | INHALATION_SPRAY | Freq: Four times a day (QID) | RESPIRATORY_TRACT | Status: AC | PRN

## 2014-10-14 MED ORDER — LOSARTAN POTASSIUM-HCTZ 50-12.5 MG PO TABS: 1.0000 | ORAL_TABLET | Freq: Every day | ORAL | Status: DC

## 2014-10-14 NOTE — Patient Instructions (Signed)
Exercise to Lose Weight Exercise and a healthy diet may help you lose weight. Your doctor may suggest specific exercises. EXERCISE IDEAS AND TIPS  Choose low-cost things you enjoy doing, such as walking, bicycling, or exercising to workout videos.  Take stairs instead of the elevator.  Walk during your lunch break.  Park your car further away from work or school.  Go to a gym or an exercise class.  Start with 5 to 10 minutes of exercise each day. Build up to 30 minutes of exercise 4 to 6 days a week.  Wear shoes with good support and comfortable clothes.  Stretch before and after working out.  Work out until you breathe harder and your heart beats faster.  Drink extra water when you exercise.  Do not do so much that you hurt yourself, feel dizzy, or get very short of breath. Exercises that burn about 150 calories:  Running 1  miles in 15 minutes.  Playing volleyball for 45 to 60 minutes.  Washing and waxing a car for 45 to 60 minutes.  Playing touch football for 45 minutes.  Walking 1  miles in 35 minutes.  Pushing a stroller 1  miles in 30 minutes.  Playing basketball for 30 minutes.  Raking leaves for 30 minutes.  Bicycling 5 miles in 30 minutes.  Walking 2 miles in 30 minutes.  Dancing for 30 minutes.  Shoveling snow for 15 minutes.  Swimming laps for 20 minutes.  Walking up stairs for 15 minutes.  Bicycling 4 miles in 15 minutes.  Gardening for 30 to 45 minutes.  Jumping rope for 15 minutes.  Washing windows or floors for 45 to 60 minutes. Document Released: 06/09/2010 Document Revised: 07/30/2011 Document Reviewed: 06/09/2010 Exit[employer] Patient Information 2015 [employer]. This information is not intended to replace advice given to you by your health care provider. Make sure you discuss any questions you have with your health care provider.  

## 2014-10-14 NOTE — Addendum Note (Signed)
Addended by: Bennie PieriniMARTIN, MARY-MARGARET on: 10/14/2014 04:38 PM   Modules accepted: Orders

## 2014-10-14 NOTE — Progress Notes (Addendum)
Subjective:    Patient ID: Deanna Friedman, female    DOB: May 23, 1975, 39 y.o.   MRN: [id number]   Patient here today for follow up of chronic medical problems.   Hypertension This is a chronic problem. The current episode started more than 1 year ago. The problem is unchanged. The problem is controlled. Pertinent negatives include no chest pain, headaches, neck pain, palpitations or shortness of breath. Risk factors for coronary artery disease include dyslipidemia. Past treatments include angiotensin blockers and diuretics. The current treatment provides moderate improvement. Compliance problems include diet and exercise.  There is no history of CAD/MI or CVA.  migraines Still having frequently- takes maxalt which helps- sees [place]- he wants to put her preventative meds, but everything she tries causes side effects she cannot stand. She takes ultram when she needs it. Occasional demerol and only takes when really bad. BMI 34 Currently on phentermine that she got [facility]- she has been on a month and has lost no weight so far. Allergies/asthma Patient has multiple allergies- she is only on ventolin but has to use frequently- on no maintenance meds- she coughs all the time and is worse when she is laughing. Adult ADHD Was on vyvanse in the past= has not been on anything in awhile- wants to stat back because she is having trouble concentrating. At work- can't get work completed.   Review of Systems  Constitutional: Negative.   HENT: Negative.   Respiratory: Negative for shortness of breath.   Cardiovascular: Negative for chest pain and palpitations.  Genitourinary: Negative.   Musculoskeletal: Negative for neck pain.  Neurological: Negative for headaches.  Psychiatric/Behavioral: Negative.   All other systems reviewed and are negative.      Objective:   Physical Exam  Constitutional: She is oriented to person, place, and time. She appears well-developed and well-nourished.  No distress.  HENT:  Nose: Nose normal.  Mouth/Throat: Oropharynx is clear and moist.  Eyes: EOM are normal.  Neck: Trachea normal, normal range of motion and full passive range of motion without pain. Neck supple. No JVD present. Carotid bruit is not present. No thyromegaly present.  Cardiovascular: Normal rate, regular rhythm, normal heart sounds and intact distal pulses.  Exam reveals no gallop and no friction rub.   No murmur heard. Pulmonary/Chest: Effort normal and breath sounds normal.  Abdominal: Soft. Bowel sounds are normal. She exhibits no distension and no mass. There is no tenderness.  Musculoskeletal: Normal range of motion.  Lymphadenopathy:    She has no cervical adenopathy.  Neurological: She is alert and oriented to person, place, and time. She has normal reflexes.  Skin: Skin is warm and dry.  Psychiatric: She has a normal mood and affect. Her behavior is normal. Judgment and thought content normal.    BP 124/84 mmHg  Pulse 121  Temp(Src) 98 F (36.7 C) (Oral)  Ht  (1.626 m)  Wt 200 lb (90.719 kg)  BMI 34.31 kg/m2       Assessment & Plan:  1. Intractable migraine with aura with status migrainosus Keep follow up with Dr. Jeri Cos  2. Multiple allergies/asthma Added qvar - albuterol (PROVENTIL HFA;VENTOLIN HFA) 108 (90 BASE) MCG/ACT inhaler; Inhale 2 puffs into the lungs every 6 (six) hours as needed for wheezing or shortness of breath.  Dispense: 1 Inhaler; Refill: 0 - beclomethasone (QVAR) 40 MCG/ACT inhaler; Inhale 2 puffs into the lungs daily.  Dispense: 1 Inhaler; Refill: 12  3. Obesity Exercise encouraged  4.  Essential hypertension Do not add salt o diet - losartan-hydrochlorothiazide (HYZAAR) 50-12.5 MG per tablet; Take 1 tablet by mouth daily.  Dispense: 30 tablet; Refill: 6  5. ADHD - vyvanse 40mg  daily #30 0 refills - vyvnase 40mg  daily #0 do  Not fill till 11/13/14 Labs pending Health maintenance reviewed Diet and exercise  encouraged Continue all meds Follow up  In 6 months   Deanna Daphine DeutscherMartin, FNP

## 2014-10-15 LAB — CMP14+EGFR
ALT: 79 IU/L — ABNORMAL HIGH (ref 0–32)
AST: 42 IU/L — ABNORMAL HIGH (ref 0–40)
Albumin/Globulin Ratio: 1.8 (ref 1.1–2.5)
Albumin: 4.7 g/dL (ref 3.5–5.5)
Alkaline Phosphatase: 66 IU/L (ref 39–117)
BUN / CREAT RATIO: 31 — AB (ref 9–23)
BUN: 23 mg/dL (ref 6–24)
Bilirubin Total: 0.5 mg/dL (ref 0.0–1.2)
CHLORIDE: 99 mmol/L (ref 97–108)
CO2: 23 mmol/L (ref 18–29)
CREATININE: 0.74 mg/dL (ref 0.57–1.00)
Calcium: 10.3 mg/dL — ABNORMAL HIGH (ref 8.7–10.2)
GFR calc Af Amer: 116 mL/min/{1.73_m2} (ref >59)
GFR calc non Af Amer: 101 mL/min/{1.73_m2} (ref >59)
Globulin, Total: 2.6 g/dL (ref 1.5–4.5)
Glucose: 93 mg/dL (ref 65–99)
POTASSIUM: 3.7 mmol/L (ref 3.5–5.2)
Sodium: 139 mmol/L (ref 134–144)
Total Protein: 7.3 g/dL (ref 6.0–8.5)

## 2014-10-15 LAB — NMR, LIPOPROFILE
CHOLESTEROL: 158 mg/dL (ref 100–199)
HDL CHOLESTEROL BY NMR: 44 mg/dL (ref >39)
HDL PARTICLE NUMBER: 37.6 umol/L (ref >=30.5)
LDL PARTICLE NUMBER: 932 nmol/L (ref <1000)
LDL SIZE: 20.9 nm (ref >20.5)
LDL-C: 77 mg/dL (ref 0–99)
LP-IR Score: 74 — ABNORMAL HIGH (ref <=45)
Small LDL Particle Number: 482 nmol/L (ref <=527)
Triglycerides by NMR: 186 mg/dL — ABNORMAL HIGH (ref 0–149)

## 2014-10-21 ENCOUNTER — Telehealth: Payer: Self-pay

## 2014-10-21 NOTE — Telephone Encounter (Signed)
Vyvanse 40 mg approved  [id number]  09/30/14 to 10/21/15

## 2014-11-01 ENCOUNTER — Encounter: Payer: Self-pay | Admitting: Adult Health

## 2014-11-01 ENCOUNTER — Ambulatory Visit (INDEPENDENT_AMBULATORY_CARE_PROVIDER_SITE_OTHER): Payer: BC Managed Care – PPO | Admitting: Adult Health

## 2014-11-01 VITALS — BP 128/80 | HR 84 | Ht 64.0 in | Wt 199.0 lb

## 2014-11-01 DIAGNOSIS — Z713 Dietary counseling and surveillance: Secondary | ICD-10-CM | POA: Diagnosis not present

## 2014-11-01 DIAGNOSIS — E669 Obesity, unspecified: Secondary | ICD-10-CM | POA: Diagnosis not present

## 2014-11-01 MED ORDER — PHENTERMINE HCL 37.5 MG PO CAPS: 37.5000 mg | ORAL_CAPSULE | ORAL | Status: DC

## 2014-11-01 NOTE — Patient Instructions (Signed)
Continue weight loss efforts Increase exercise Follow up in 4 weeks

## 2014-11-01 NOTE — Progress Notes (Signed)
Subjective:     Patient ID: Deanna Friedman, female   DOB: 1975-09-20, 39 y.o.   MRN: [id number]  HPI Deanna Friedman is a 39 year old white female, married in to check weight and BP, she has been on adipex and has lost 5 lbs.She says she is using protein shakes and bars during the day and has a meal at night with the family.  Review of Systems Patient denies any daily headaches, hearing loss, fatigue, blurred vision, shortness of breath, chest pain, abdominal pain, problems with bowel movements, urination, or intercourse. No joint pain or mood swings.    Objective:   Physical Exam BP 128/80 mmHg  Pulse 84  Ht 5\' 4"  (1.626 m)  Wt 199 lb (90.266 kg)  BMI 34.14 kg/m2 Skin warm and dry. Lungs: clear to ausculation bilaterally. Cardiovascular: regular rate and rhythm.She has lost 5 lbs.And is encouraged to increase exercise and take time for self, but with 39 year old and 39 year old and job, she says it is hard.    Assessment:     Weight loss counseling Obesity     Plan:     Rx adipex 37.5 mg #30 take 1 daily Follow up in 4 weeks Continue weight loss efforts

## 2014-11-29 ENCOUNTER — Ambulatory Visit (INDEPENDENT_AMBULATORY_CARE_PROVIDER_SITE_OTHER): Payer: BC Managed Care – PPO | Admitting: Adult Health

## 2014-11-29 ENCOUNTER — Encounter: Payer: Self-pay | Admitting: Adult Health

## 2014-11-29 VITALS — BP 112/82 | HR 104 | Ht 63.0 in | Wt 197.5 lb

## 2014-11-29 DIAGNOSIS — R52 Pain, unspecified: Secondary | ICD-10-CM | POA: Diagnosis not present

## 2014-11-29 HISTORY — DX: Pain, unspecified: R52

## 2014-11-29 MED ORDER — MEDROXYPROGESTERONE ACETATE 150 MG/ML IM SUSP: INTRAMUSCULAR | Status: DC

## 2014-11-29 NOTE — Patient Instructions (Signed)
Will talk when labs back Follow up in 4 weeks

## 2014-11-29 NOTE — Progress Notes (Signed)
Subjective:     Patient ID: Deanna Friedman, female   DOB: 11-29-1975, 39 y.o.   MRN: [id number]  HPI Diamone is a 39 year old white female back for weight and BP check.She has lost 1.5 lbs more.She is complaining of body aches in muscles and joints and says will get cysts on pinky finger and legs and breasts at times, noon now and this has happened for a year but seems worse now.No family history of RA.  Review of Systems  Patient denies any headaches, hearing loss, fatigue, blurred vision, shortness of breath, chest pain, abdominal pain, problems with bowel movements, urination, or intercourse. No mood swings.See HPI for positives.  Reviewed past medical,surgical, social and family history. Reviewed medications and allergies.     Objective:   Physical Exam BP 112/82 mmHg  Pulse 104  Ht $R'5\' 3"'pW$  (1.6 m)  Wt 197 lb 8 oz (89.585 kg)  BMI 34.99 kg/m2   Skin warm and dry. Lungs: clear to ausculation bilaterally. Cardiovascular: regular rate and rhythm.No point tenderness at joints, or swelling today, will get labs to assess, and has used adipex for 3 months wil stop for now and see how she does.  Assessment:     Body aches     Plan:     Check CBC,ESR,ANA,RF, will talk when labs back Follow up in 4 weeks Refilled depo x 3 at her request.shot due in August

## 2014-11-30 LAB — CBC
Hematocrit: 44.1 % (ref 34.0–46.6)
Hemoglobin: 15.6 g/dL (ref 11.1–15.9)
MCH: 33.5 pg — ABNORMAL HIGH (ref 26.6–33.0)
MCHC: 35.4 g/dL (ref 31.5–35.7)
MCV: 95 fL (ref 79–97)
Platelets: 273 10*3/uL (ref 150–379)
RBC: 4.65 x10E6/uL (ref 3.77–5.28)
RDW: 13.1 % (ref 12.3–15.4)
WBC: 5.4 10*3/uL (ref 3.4–10.8)

## 2014-11-30 LAB — RHEUMATOID FACTOR: RHEUMATOID FACTOR: 7.1 [IU]/mL (ref 0.0–13.9)

## 2014-11-30 LAB — SEDIMENTATION RATE: Sed Rate: 12 mm/hr (ref 0–32)

## 2014-11-30 LAB — ANA: Anti Nuclear Antibody(ANA): NEGATIVE

## 2014-12-01 ENCOUNTER — Telehealth: Payer: Self-pay | Admitting: Adult Health

## 2014-12-01 NOTE — Telephone Encounter (Signed)
Pt aware labs normal, if continues with aches and pains see PCP

## 2014-12-09 ENCOUNTER — Ambulatory Visit: Payer: BC Managed Care – PPO | Admitting: Nurse Practitioner

## 2014-12-30 ENCOUNTER — Encounter: Payer: Self-pay | Admitting: Adult Health

## 2014-12-30 ENCOUNTER — Ambulatory Visit (INDEPENDENT_AMBULATORY_CARE_PROVIDER_SITE_OTHER): Payer: BC Managed Care – PPO | Admitting: Adult Health

## 2014-12-30 VITALS — BP 136/80 | HR 96 | Ht 64.0 in | Wt 196.0 lb

## 2014-12-30 DIAGNOSIS — L298 Other pruritus: Secondary | ICD-10-CM

## 2014-12-30 DIAGNOSIS — Z6833 Body mass index (BMI) 33.0-33.9, adult: Secondary | ICD-10-CM | POA: Diagnosis not present

## 2014-12-30 DIAGNOSIS — R6882 Decreased libido: Secondary | ICD-10-CM

## 2014-12-30 DIAGNOSIS — Z713 Dietary counseling and surveillance: Secondary | ICD-10-CM | POA: Diagnosis not present

## 2014-12-30 DIAGNOSIS — N898 Other specified noninflammatory disorders of vagina: Secondary | ICD-10-CM

## 2014-12-30 DIAGNOSIS — E669 Obesity, unspecified: Secondary | ICD-10-CM

## 2014-12-30 HISTORY — DX: Other specified noninflammatory disorders of vagina: N89.8

## 2014-12-30 HISTORY — DX: Decreased libido: R68.82

## 2014-12-30 LAB — POCT WET PREP (WET MOUNT): WBC, Wet Prep HPF POC: NEGATIVE

## 2014-12-30 MED ORDER — NYSTATIN-TRIAMCINOLONE 100000-0.1 UNIT/GM-% EX OINT: 1.0000 "application " | TOPICAL_OINTMENT | Freq: Two times a day (BID) | CUTANEOUS | Status: DC

## 2014-12-30 MED ORDER — MOMETASONE FUROATE 0.1 % EX CREA: 1.0000 "application " | TOPICAL_CREAM | Freq: Every day | CUTANEOUS | Status: DC

## 2014-12-30 MED ORDER — PHENTERMINE HCL 37.5 MG PO CAPS: 37.5000 mg | ORAL_CAPSULE | ORAL | Status: DC

## 2014-12-30 NOTE — Progress Notes (Signed)
Subjective:     Patient ID: Deanna Friedman, female   DOB: 30-Jan-1976, 39 y.o.   MRN: [id number]  HPI Deanna Friedman is a 39 year old white female, married in complaining of vaginal discharge and itch, and decreased sex drive and wants to try adipex again.She says snores and has sleep apnea but won't use C-pap and she gets irritated with him and then does not want sex.[place] [facility] [place] and feels motivated to lose weight.  Review of Systems Patient denies any headaches, hearing loss, fatigue, blurred vision, shortness of breath, chest pain, abdominal pain, problems with bowel movements, urination,  No joint pain or mood swings.See HPI for positives. Reviewed past medical,surgical, social and family history. Reviewed medications and allergies.     Objective:   Physical Exam BP 136/80 mmHg  Pulse 96  Ht  (1.626 m)  Wt 196 lb (88.905 kg)  BMI 33.63 kg/m2 Skin warm and dry. Neck: mid line trachea, normal thyroid, good ROM, no lymphadenopathy noted. Lungs: clear to ausculation bilaterally. Cardiovascular: regular rate and rhythm.Pelvic: external genitalia is normal in appearance no lesions, vagina: white discharge without odor,urethra has no lesions or masses noted, cervix:smooth and bulbous, uterus: normal size, shape and contour, non tender, no masses felt, adnexa: no masses or tenderness noted. Bladder is non tender and no masses felt. Wet prep: negative,discussed talking to him and try to have more often and that depo will decrease drive too.    Assessment:     Vaginal discharge Vaginal itch Weight counseling Obesity BMI 33.63 Decrease sex drive    Plan:     Rx adipex 37.5 mg #30 take 1 daily, no refills Rx mycolog ointment use 2-3 x daily with 2 refills Refilled elocon cream x 1 Follow up in 4 weeks Talk with partner and increase frequency of sex

## 2014-12-30 NOTE — Patient Instructions (Signed)
Follow-up in 4 weeks

## 2015-01-05 ENCOUNTER — Ambulatory Visit: Payer: BC Managed Care – PPO | Admitting: Nurse Practitioner

## 2015-01-06 ENCOUNTER — Encounter: Payer: Self-pay | Admitting: Nurse Practitioner

## 2015-01-27 ENCOUNTER — Encounter: Payer: Self-pay | Admitting: Adult Health

## 2015-01-27 ENCOUNTER — Ambulatory Visit (INDEPENDENT_AMBULATORY_CARE_PROVIDER_SITE_OTHER): Payer: BC Managed Care – PPO | Admitting: Adult Health

## 2015-01-27 VITALS — BP 130/90 | HR 84 | Ht 64.0 in | Wt 193.0 lb

## 2015-01-27 DIAGNOSIS — Z6833 Body mass index (BMI) 33.0-33.9, adult: Secondary | ICD-10-CM | POA: Diagnosis not present

## 2015-01-27 DIAGNOSIS — Z713 Dietary counseling and surveillance: Secondary | ICD-10-CM

## 2015-01-27 DIAGNOSIS — L739 Follicular disorder, unspecified: Secondary | ICD-10-CM | POA: Insufficient documentation

## 2015-01-27 HISTORY — DX: Follicular disorder, unspecified: L73.9

## 2015-01-27 MED ORDER — SULFAMETHOXAZOLE-TRIMETHOPRIM 800-160 MG PO TABS: 1.0000 | ORAL_TABLET | Freq: Two times a day (BID) | ORAL | Status: DC

## 2015-01-27 MED ORDER — PHENTERMINE HCL 37.5 MG PO CAPS: 37.5000 mg | ORAL_CAPSULE | ORAL | Status: DC

## 2015-01-27 NOTE — Progress Notes (Signed)
Subjective:     Patient ID: Deanna Friedman, female   DOB: [birth year], 39 y.o.   MRN: [id number]  HPI Deanna Friedman is a 39 year old white female, married in for weight and BP check, and has lost 3 more lbs, and has bump on mons pubis.  Review of Systems Patient denies any headaches, hearing loss, fatigue, blurred vision, shortness of breath, chest pain, abdominal pain, problems with bowel movements, urination, or intercourse. No joint pain or mood swings.See HPI for positives. Reviewed past medical,surgical, social and family history. Reviewed medications and allergies.     Objective:   Physical Exam BP 130/90 mmHg  Pulse 84  Ht  (1.626 m)  Wt 193 lb (87.544 kg)  BMI 33.11 kg/m2 Skin warm and dry. Lungs: clear to ausculation bilaterally. Cardiovascular: regular rate and rhythm.Has folliculitis in mons pubis, with 3 cm area under skin.    Assessment:     Weight loss counseling BMI 33.11 folliculitis     Plan:     rx adipex 37.5 mg #30 take 1 daily, no refills rx septra ds 1 bid x 14 days,#28  with 1 refill   Follow up in 4 weeks Use antibacterial bar soap, no shaving

## 2015-01-27 NOTE — Patient Instructions (Signed)
Keep working on weight loss Follow up in 4 weeks Use antibacterial bar soap

## 2015-02-17 ENCOUNTER — Ambulatory Visit (INDEPENDENT_AMBULATORY_CARE_PROVIDER_SITE_OTHER): Payer: BC Managed Care – PPO | Admitting: Adult Health

## 2015-02-17 ENCOUNTER — Encounter: Payer: Self-pay | Admitting: Adult Health

## 2015-02-17 VITALS — BP 120/82 | HR 98 | Ht 63.0 in | Wt 193.5 lb

## 2015-02-17 DIAGNOSIS — L739 Follicular disorder, unspecified: Secondary | ICD-10-CM | POA: Diagnosis not present

## 2015-02-17 DIAGNOSIS — Z713 Dietary counseling and surveillance: Secondary | ICD-10-CM

## 2015-02-17 DIAGNOSIS — Z6834 Body mass index (BMI) 34.0-34.9, adult: Secondary | ICD-10-CM | POA: Diagnosis not present

## 2015-02-17 MED ORDER — PHENTERMINE HCL 37.5 MG PO CAPS: 37.5000 mg | ORAL_CAPSULE | ORAL | Status: DC

## 2015-02-17 NOTE — Progress Notes (Signed)
Subjective:     Patient ID: Deanna Friedman, female   DOB: June 22, 1975, 39 y.o.   MRN: [id number]  HPI Deanna Friedman is a 39 year old white female,married in for weight check and BP check she was 190 at home,feels like retaining fluid at times.  Review of Systems Patient denies any headaches, hearing loss, fatigue, blurred vision, shortness of breath, chest pain, abdominal pain, problems with bowel movements, urination, or intercourse. No joint pain or mood swings. Reviewed past medical,surgical, social and family history. Reviewed medications and allergies.     Objective:   Physical Exam BP 120/82 mmHg  Pulse 98  Ht  (1.6 m)  Wt 193 lb 8 oz (87.771 kg)  BMI 34.29 kg/m2 Skin warm and dry.  Lungs: clear to ausculation bilaterally. Cardiovascular: regular rate and rhythm. Ingrown hair on mons pubis resolving.    Assessment:     Weight loss counseling  BMI 34.29   Ingrown hair resolving Plan:     Rx adipex 37.5 mg #30 take 1 daily  Follow up in 4 weeks Continue weight loss efforts

## 2015-02-23 ENCOUNTER — Ambulatory Visit: Payer: BC Managed Care – PPO | Admitting: Adult Health

## 2015-03-17 ENCOUNTER — Ambulatory Visit (INDEPENDENT_AMBULATORY_CARE_PROVIDER_SITE_OTHER): Payer: BC Managed Care – PPO | Admitting: Adult Health

## 2015-03-17 ENCOUNTER — Encounter: Payer: Self-pay | Admitting: Adult Health

## 2015-03-17 VITALS — BP 120/80 | HR 88 | Ht 63.0 in | Wt 195.0 lb

## 2015-03-17 DIAGNOSIS — Z713 Dietary counseling and surveillance: Secondary | ICD-10-CM | POA: Diagnosis not present

## 2015-03-17 DIAGNOSIS — Z6834 Body mass index (BMI) 34.0-34.9, adult: Secondary | ICD-10-CM

## 2015-03-17 MED ORDER — PHENTERMINE HCL 37.5 MG PO CAPS: 37.5000 mg | ORAL_CAPSULE | ORAL | Status: DC

## 2015-03-17 NOTE — Progress Notes (Signed)
Subjective:     Patient ID: Margaretmary BayleyJodi H Masten, female   DOB: [birth year], 39 y.o.   MRN: [id number]  HPI Lennox LaityJodi is a 39 year old white female in for weight and BP check, she is on adipex.  Review of Systems Patient denies any daily headaches, hearing loss, fatigue, blurred vision, shortness of breath, chest pain, abdominal pain, problems with bowel movements, urination, or intercourse. No joint pain or mood swings. Reviewed past medical,surgical, social and family history. Reviewed medications and allergies.     Objective:   Physical Exam BP 120/80 mmHg  Pulse 88  Ht 5\' 3"  (1.6 m)  Wt 195 lb (88.451 kg)  BMI 34.55 kg/m2 Skin warm and dry.  Lungs: clear to ausculation bilaterally. Cardiovascular: regular rate and rhythm.   She gained 1.5 lbs this month but has had family stress, will try another month.  Assessment:   Weight loss counseling BMI 34.55    Plan:    Increase activity  Rx adipex 37.5 mg #30 take 1 daily no refills Follow up in 4 weeks

## 2015-03-17 NOTE — Patient Instructions (Signed)
Try to increase activity Follow up in 4 weeks

## 2015-04-11 ENCOUNTER — Ambulatory Visit (INDEPENDENT_AMBULATORY_CARE_PROVIDER_SITE_OTHER): Payer: BC Managed Care – PPO | Admitting: Adult Health

## 2015-04-11 ENCOUNTER — Encounter: Payer: Self-pay | Admitting: Adult Health

## 2015-04-11 VITALS — BP 120/90 | HR 88 | Ht 63.0 in | Wt 196.0 lb

## 2015-04-11 DIAGNOSIS — Z713 Dietary counseling and surveillance: Secondary | ICD-10-CM | POA: Diagnosis not present

## 2015-04-11 NOTE — Progress Notes (Signed)
Subjective:     Patient ID: Deanna Friedman, female   DOB: May 26, 1975, 39 y.o.   MRN: [id number]  HPI Deanna Friedman is a 39 year old white female in for weight check,has been on adipex.  Review of Systems Patient denies any headaches, hearing loss, fatigue, blurred vision, shortness of breath, chest pain, abdominal pain, problems with bowel movements, urination, or intercourse. No joint pain or mood swings. Reviewed past medical,surgical, social and family history. Reviewed medications and allergies.     Objective:   Physical Exam BP 134/100 mmHg  Pulse 88  Ht 5\' 3"  (1.6 m)  Wt 196 lb (88.905 kg)  BMI 34.73 kg/m2   BP recheck 120/90, Skin warm and dry.  Lungs: clear to ausculation bilaterally. Cardiovascular: regular rate and rhythm.Discussed stopping adipex and trying  Weight watchers, she says life is too busy right now.  Assessment:     Weight loss counseling    Plan:     Will stop adipex due to no weight loss Try weight watcher,increased exercise and continue to watch crabs Talk with Deanna Friedman Follow up prn

## 2015-04-11 NOTE — Patient Instructions (Signed)
No more adipex Try increasing exercise and decrease carbs, Try weight watchers Follow up prn

## 2015-04-29 ENCOUNTER — Other Ambulatory Visit: Payer: Self-pay | Admitting: Family Medicine

## 2015-04-29 NOTE — Telephone Encounter (Signed)
Please advise 

## 2015-04-29 NOTE — Telephone Encounter (Signed)
Stp and advised of md feedback. Pt states she will just go somewhere else as she doesn't have time to come here.

## 2015-04-29 NOTE — Telephone Encounter (Signed)
zithromax does not work well for strep- also has  Not been seen since May- really NTBS

## 2015-08-19 ENCOUNTER — Emergency Department (HOSPITAL_COMMUNITY): Payer: BC Managed Care – PPO

## 2015-08-19 ENCOUNTER — Encounter (HOSPITAL_COMMUNITY): Payer: Self-pay | Admitting: Emergency Medicine

## 2015-08-19 ENCOUNTER — Emergency Department (HOSPITAL_COMMUNITY)
Admission: EM | Admit: 2015-08-19 | Discharge: 2015-08-19 | Disposition: A | Discharge status: Home / Self Care | Payer: BC Managed Care – PPO | Attending: Emergency Medicine | Admitting: Emergency Medicine

## 2015-08-19 DIAGNOSIS — E876 Hypokalemia: Secondary | ICD-10-CM | POA: Diagnosis not present

## 2015-08-19 DIAGNOSIS — I10 Essential (primary) hypertension: Secondary | ICD-10-CM | POA: Diagnosis not present

## 2015-08-19 DIAGNOSIS — M791 Myalgia: Secondary | ICD-10-CM | POA: Diagnosis not present

## 2015-08-19 DIAGNOSIS — R05 Cough: Secondary | ICD-10-CM | POA: Diagnosis present

## 2015-08-19 DIAGNOSIS — R0789 Other chest pain: Secondary | ICD-10-CM | POA: Insufficient documentation

## 2015-08-19 DIAGNOSIS — J40 Bronchitis, not specified as acute or chronic: Secondary | ICD-10-CM | POA: Insufficient documentation

## 2015-08-19 DIAGNOSIS — E669 Obesity, unspecified: Secondary | ICD-10-CM | POA: Diagnosis not present

## 2015-08-19 LAB — CBC WITH DIFFERENTIAL/PLATELET
BASOS ABS: 0 10*3/uL (ref 0.0–0.1)
BASOS PCT: 0 %
EOS PCT: 0 %
Eosinophils Absolute: 0 10*3/uL (ref 0.0–0.7)
HCT: 45.3 % (ref 36.0–46.0)
Hemoglobin: 15.7 g/dL — ABNORMAL HIGH (ref 12.0–15.0)
Lymphocytes Relative: 23 %
Lymphs Abs: 2.3 10*3/uL (ref 0.7–4.0)
MCH: 32.7 pg (ref 26.0–34.0)
MCHC: 34.7 g/dL (ref 30.0–36.0)
MCV: 94.4 fL (ref 78.0–100.0)
MONO ABS: 0.6 10*3/uL (ref 0.1–1.0)
Monocytes Relative: 7 %
NEUTROS PCT: 70 %
Neutro Abs: 6.8 10*3/uL (ref 1.7–7.7)
PLATELETS: 309 10*3/uL (ref 150–400)
RBC: 4.8 MIL/uL (ref 3.87–5.11)
RDW: 12.5 % (ref 11.5–15.5)
WBC: 9.7 10*3/uL (ref 4.0–10.5)

## 2015-08-19 LAB — BASIC METABOLIC PANEL
Anion gap: 13 (ref 5–15)
BUN: 12 mg/dL (ref 6–20)
CHLORIDE: 104 mmol/L (ref 101–111)
CO2: 25 mmol/L (ref 22–32)
CREATININE: 0.85 mg/dL (ref 0.44–1.00)
Calcium: 9.4 mg/dL (ref 8.9–10.3)
GFR calc non Af Amer: >60 mL/min (ref >60)
Glucose, Bld: 114 mg/dL — ABNORMAL HIGH (ref 65–99)
POTASSIUM: 2.9 mmol/L — AB (ref 3.5–5.1)
SODIUM: 142 mmol/L (ref 135–145)

## 2015-08-19 LAB — D-DIMER, QUANTITATIVE (NOT AT ARMC): D DIMER QUANT: 0.63 ug{FEU}/mL — AB (ref 0.00–0.50)

## 2015-08-19 MED ORDER — IOPAMIDOL (ISOVUE-370) INJECTION 76%
100.0000 mL | Freq: Once | INTRAVENOUS | Status: AC | PRN
  Administered 2015-08-19: 100 mL via INTRAVENOUS

## 2015-08-19 MED ORDER — HYDROCOD POLST-CPM POLST ER 10-8 MG/5ML PO SUER
5.0000 mL | Freq: Once | ORAL | Status: AC
  Administered 2015-08-19: 5 mL via ORAL
  Filled 2015-08-19: qty 5

## 2015-08-19 MED ORDER — POTASSIUM CHLORIDE CRYS ER 20 MEQ PO TBCR: 20.0000 meq | EXTENDED_RELEASE_TABLET | Freq: Two times a day (BID) | ORAL | Status: DC

## 2015-08-19 MED ORDER — POTASSIUM CHLORIDE CRYS ER 20 MEQ PO TBCR
40.0000 meq | EXTENDED_RELEASE_TABLET | Freq: Once | ORAL | Status: AC
  Administered 2015-08-19: 40 meq via ORAL
  Filled 2015-08-19: qty 2

## 2015-08-19 MED ORDER — HYDROCODONE-ACETAMINOPHEN 5-325 MG PO TABS
1.0000 | ORAL_TABLET | Freq: Once | ORAL | Status: AC
Start: 2015-08-19 — End: 2015-08-19
  Administered 2015-08-19: 1 via ORAL
  Filled 2015-08-19: qty 1

## 2015-08-19 MED ORDER — HYDROCOD POLST-CPM POLST ER 10-8 MG/5ML PO SUER: 5.0000 mL | Freq: Two times a day (BID) | ORAL | Status: DC | PRN

## 2015-08-19 MED ORDER — IPRATROPIUM-ALBUTEROL 0.5-2.5 (3) MG/3ML IN SOLN
3.0000 mL | Freq: Once | RESPIRATORY_TRACT | Status: AC
  Administered 2015-08-19: 3 mL via RESPIRATORY_TRACT
  Filled 2015-08-19: qty 3

## 2015-08-19 MED ORDER — ALBUTEROL SULFATE (2.5 MG/3ML) 0.083% IN NEBU
2.5000 mg | INHALATION_SOLUTION | Freq: Once | RESPIRATORY_TRACT | Status: AC
  Administered 2015-08-19: 2.5 mg via RESPIRATORY_TRACT
  Filled 2015-08-19: qty 3

## 2015-08-19 NOTE — ED Notes (Signed)
PT states had Zpack on 08/08/15 and originally had gotten better then cough with generalized body aches returned x1 week and was seen at urgent care yesterday and was told RLL possible pneumonia and was started on Levaquin 500 yesterday.

## 2015-08-19 NOTE — Discharge Instructions (Signed)

## 2015-08-19 NOTE — ED Notes (Signed)
Attempted to call patient from waiting area, no answer

## 2015-08-19 NOTE — ED Notes (Signed)
Pt seen and eval by EDPa for initial assessment. 

## 2015-08-21 NOTE — ED Provider Notes (Signed)
CSN: [id number]     Arrival date & time 08/19/15  1028 History   First MD Initiated Contact with Patient 08/19/15 1236     Chief Complaint  Patient presents with  . Cough     (Consider location/radiation/quality/duration/timing/severity/associated sxs/prior Treatment) HPI   Deanna Friedman is a 40 y.o. female who presents to the Emergency Department complaining of persistent cough, generalized body aches and nasal congestion for nearly two weeks.  She was initially seen at a local urgent care and started on a Z pack with mild improvement, then one week ago developed symptoms again and seen at urgent care again one day prior to this visit and given prednisone and Levaquin.  She has been using an albuterol inhaler at home, but not regularly.  She reports gradual onset of right upper back and flank pain for several days that worsening with coughing and certain movements of the right arm.  She denies shortness of breath, fever, chills, chest or abdominal pain, vomiting or dysuria.     Past Medical History  Diagnosis Date  . Migraines   . HTN (hypertension)   . Multiple allergies   . BV (bacterial vaginosis) 10/14/2013  . Obesity 10/14/2013  . Contraceptive management 03/31/2014  . Body aches 11/29/2014  . Vaginal itching 12/30/2014  . Decreased sex drive 7/25/36648/03/2015  . Folliculitis 01/27/2015   Past Surgical History  Procedure Laterality Date  . Knee arthroscopy    . Breast lumpectomy Right   . Dilation and curettage of uterus     Family History  Problem Relation Age of Onset  . Hypertension Mother   . Heart disease Mother   . Alcohol abuse Maternal Grandfather   . Hypertension Paternal Grandmother   . Diabetes Paternal Grandmother   . Cancer Paternal Grandfather   . Other Sister     Graves Disease  . Other Maternal Grandmother     was murdered   Social History  Substance Use Topics  . Smoking status: Former Smoker    Types: Cigarettes  . Smokeless tobacco: Never Used  .  Alcohol Use: Yes     Comment: social   OB History    Gravida Para Term Preterm AB TAB SAB Ectopic Multiple Living   3 3        3      Review of Systems  Constitutional: Negative for fever, chills and appetite change.  HENT: Positive for congestion. Negative for sore throat and trouble swallowing.   Respiratory: Positive for cough and chest tightness. Negative for shortness of breath and wheezing.   Gastrointestinal: Negative for nausea, vomiting and abdominal pain.  Genitourinary: Negative for dysuria.  Musculoskeletal: Positive for myalgias and back pain (right upper back pain). Negative for arthralgias and neck pain.  Skin: Negative for rash.  Neurological: Negative for dizziness, weakness, numbness and headaches.  Hematological: Negative for adenopathy.  All other systems reviewed and are negative.     Allergies  Nutritional supplements; Shellfish allergy; Aloe; Cephalexin; Coconut fatty acids; Dust mite extract; Food; and Percodan  Home Medications   Prior to Admission medications   Medication Sig Start Date End Date Taking? Authorizing Provider  albuterol (PROVENTIL HFA;VENTOLIN HFA) 108 (90 BASE) MCG/ACT inhaler Inhale 2 puffs into the lungs every 6 (six) hours as needed for wheezing or shortness of breath. 10/14/14   Mary-Margaret Daphine DeutscherMartin, FNP  chlorpheniramine-HYDROcodone (TUSSIONEX PENNKINETIC ER) 10-8 MG/5ML SUER Take 5 mLs by mouth every 12 (twelve) hours as needed for cough. 08/19/15   Genevive Printup,  PA-C  EPINEPHrine (EPIPEN) 0.3 mg/0.3 mL IJ SOAJ injection Inject 0.3 mLs (0.3 mg total) into the muscle once.    Mary-Margaret Daphine Deutscher, FNP  ibuprofen (ADVIL,MOTRIN) 800 MG tablet Take 800 mg by mouth every 8 (eight) hours as needed for pain.    Historical Provider, MD  losartan-hydrochlorothiazide (HYZAAR) 50-12.5 MG per tablet Take 1 tablet by mouth daily. 10/14/14   Mary-Margaret Daphine Deutscher, FNP  medroxyPROGESTERone (DEPO-PROVERA) 150 MG/ML injection INJECT INTRAMUSCULARLY EVERY  3 MONTHS 11/29/14   Adline Potter, NP  meperidine (DEMEROL) 50 MG tablet 50 mg as needed.  06/02/14   Historical Provider, MD  mometasone (ELOCON) 0.1 % cream Apply 1 application topically daily. Patient taking differently: Apply 1 application topically as needed.  12/30/14   Adline Potter, NP  nystatin-triamcinolone ointment [facility]) Apply 1 application topically 2 (two) times daily. 12/30/14   Adline Potter, NP  potassium chloride SA (K-DUR,KLOR-CON) 20 MEQ tablet Take 1 tablet (20 mEq total) by mouth 2 (two) times daily. For 5 days 08/19/15   Arlyn Bumpus, PA-C  rizatriptan (MAXALT) 10 MG tablet Take 10 mg by mouth as needed.  07/10/13   Historical Provider, MD  sulfamethoxazole-trimethoprim (BACTRIM DS,SEPTRA DS) 800-160 MG per tablet Take 1 tablet by mouth 2 (two) times daily. 01/27/15   Adline Potter, NP  traMADol (ULTRAM) 50 MG tablet Take 50 mg by mouth every 6 (six) hours as needed for pain.    Historical Provider, MD  Triprolidine-Pseudoephedrine (ANTIHISTAMINE PO) Take by mouth 4 (four) times daily as needed.     Historical Provider, MD   BP 140/98 mmHg  Pulse 113  Temp(Src) 98 F (36.7 C) (Oral)  Resp 18  Ht  (1.626 m)  Wt 92.534 kg  BMI 35.00 kg/m2  SpO2 100% Physical Exam  Constitutional: She is oriented to person, place, and time. She appears well-developed and well-nourished. No distress.  HENT:  Head: Normocephalic and atraumatic.  Right Ear: Tympanic membrane and ear canal normal.  Left Ear: Tympanic membrane and ear canal normal.  Mouth/Throat: Uvula is midline, oropharynx is clear and moist and mucous membranes are normal. No oropharyngeal exudate.  Eyes: EOM are normal. Pupils are equal, round, and reactive to light.  Neck: Normal range of motion, full passive range of motion without pain and phonation normal. Neck supple.  Cardiovascular: Normal rate, regular rhythm, normal heart sounds and intact distal pulses.   No murmur  heard. Pulmonary/Chest: Effort normal. No stridor. No respiratory distress. She has wheezes. She has no rales. She exhibits no tenderness.  Coarse lungs sounds bilaterally w/o rales or wheezes.    Musculoskeletal: Normal range of motion. She exhibits no edema.  Focal tenderness of the right upper back, reproduced with arm movement and deep breathing.   Lymphadenopathy:    She has no cervical adenopathy.  Neurological: She is alert and oriented to person, place, and time. She exhibits normal muscle tone. Coordination normal.  Skin: Skin is warm and dry.  Nursing note and vitals reviewed.   ED Course  Procedures (including critical care time) Labs Review Labs Reviewed  BASIC METABOLIC PANEL - Abnormal; Notable for the following:    Potassium 2.9 (*)    Glucose, Bld 114 (*)    All other components within normal limits  CBC WITH DIFFERENTIAL/PLATELET - Abnormal; Notable for the following:    Hemoglobin 15.7 (*)    All other components within normal limits  D-DIMER, QUANTITATIVE (NOT AT [facility]) - Abnormal; Notable for the following:  D-Dimer, Quant 0.63 (*)    All other components within normal limits    Imaging Review Dg Chest 2 View  08/19/2015  CLINICAL DATA:  Nonproductive cough and congestion for 2 weeks EXAM: CHEST  2 VIEW COMPARISON:  None. FINDINGS: The heart size and mediastinal contours are within normal limits. Both lungs are clear. The visualized skeletal structures are unremarkable. IMPRESSION: No active cardiopulmonary disease. Electronically Signed   By: Charlett Nose M.D.   On: 08/19/2015 11:50   Ct Angio Chest Pe W/cm &/or Wo Cm  08/19/2015  CLINICAL DATA:  Elevated D-dimer, shortness of breath, cough for 2 weeks EXAM: CT ANGIOGRAPHY CHEST WITH CONTRAST TECHNIQUE: Multidetector CT imaging of the chest was performed using the standard protocol during bolus administration of intravenous contrast. Multiplanar CT image reconstructions and MIPs were obtained to evaluate the  vascular anatomy. CONTRAST:  100 cc Isovue COMPARISON:  None. FINDINGS: Mediastinum/Lymph Nodes: Images of the thoracic inlet are unremarkable. Central airways are patent. No mediastinal hematoma or adenopathy. Heart size within normal limits. No pericardial effusion is noted. No pulmonary embolus is noted. No hilar adenopathy. No aortic aneurysm. Lungs/Pleura: No pleural effusion or pleural thickening. Images of the lung parenchyma shows no acute infiltrate or pulmonary edema. Minimal scarring or atelectasis noted in lingula posterior laterally. There is no pneumothorax. Upper abdomen: Fatty infiltration of the liver is noted. Musculoskeletal: Sagittal images of the spine shows mild degenerative changes lower thoracic spine. No destructive bony lesions are noted. Review of the MIP images confirms the above findings. IMPRESSION: 1. No pulmonary embolus is noted. 2. No acute infiltrate or pulmonary edema. 3. There is fatty infiltration of the liver. 4. Mild degenerative changes lower thoracic spine. Electronically Signed   By: Natasha Mead M.D.   On: 08/19/2015 16:32   I have personally reviewed and evaluated these images and lab results as part of my medical decision-making.   EKG Interpretation None      MDM   Final diagnoses:  Bronchitis  Hypokalemia    Pt is well appearing.  Non-toxic.  Has ambulated in the dept w/o distress, no hypoxia.  She has also drank fluids and ate crackers.  CT angio w/o evidence of PE or infiltrate.  Pt is currently taking Levaquin and prednisone, has an albuterol inhaler.  Given rx for tussionex.  Pt is hypokalemic, likely due to direutic use.  Rx for potassium, and she agrees to have level rechecked in one week by her PMD.  Advised to return to ER for any worsening symptoms.  She appears stable for d/c and agrees to plan.      Pauline Aus, PA-C 08/21/15 [id number]  Glynn Octave, MD 08/21/15 [phone]

## 2015-08-26 ENCOUNTER — Institutional Professional Consult (permissible substitution): Payer: BC Managed Care – PPO | Admitting: Pulmonary Disease

## 2015-08-29 ENCOUNTER — Institutional Professional Consult (permissible substitution): Payer: BC Managed Care – PPO | Admitting: Internal Medicine

## 2015-09-16 ENCOUNTER — Institutional Professional Consult (permissible substitution): Payer: BC Managed Care – PPO | Admitting: Internal Medicine

## 2015-10-14 ENCOUNTER — Other Ambulatory Visit: Payer: Self-pay | Admitting: Nurse Practitioner

## 2015-10-14 NOTE — Telephone Encounter (Signed)
Last refill without being seen 

## 2015-10-31 ENCOUNTER — Telehealth: Payer: Self-pay | Admitting: Nurse Practitioner

## 2015-10-31 NOTE — Telephone Encounter (Signed)
Spoke with Deanna Friedman and advised Deanna Friedman isn't taking on new pt's as he isn't working full time and offered appt with another provider but she will only speak with Deanna Friedman.

## 2015-10-31 NOTE — Telephone Encounter (Signed)
The 3 new providers were discussed and the pt will call back and set up appt with one of them.

## 2015-11-13 ENCOUNTER — Other Ambulatory Visit: Payer: Self-pay | Admitting: Nurse Practitioner

## 2015-11-14 NOTE — Telephone Encounter (Signed)
Last seen 10/14/14

## 2015-11-14 NOTE — Telephone Encounter (Signed)
No more refills without being sen 

## 2015-11-15 ENCOUNTER — Other Ambulatory Visit: Payer: Self-pay | Admitting: Adult Health

## 2015-11-15 ENCOUNTER — Telehealth: Payer: Self-pay | Admitting: Adult Health

## 2015-11-15 NOTE — Telephone Encounter (Signed)
Pt states she is getting Depo Provera, c/o hot flashes, changes in skin and hair. Pt states she also is seeing Victorino DikeJennifer for elevated BP and med refill but her pharmacy has sent the Refill request for Losartan to Paulene FloorMary Martin, FNP has managed pt blood pressure in the past. Pt was offered an appt for next week but states she would prefer to discuss with Victorino DikeJennifer and see if she feels she needs to see her sooner.

## 2015-11-15 NOTE — Telephone Encounter (Signed)
Pt states we aren't the ones that treat her for her hypertension [facility] and they are the ones who rx her BP medication.

## 2015-11-15 NOTE — Telephone Encounter (Signed)
Will make physical appt.

## 2015-11-16 ENCOUNTER — Encounter: Payer: Self-pay | Admitting: Family Medicine

## 2015-11-16 ENCOUNTER — Ambulatory Visit (INDEPENDENT_AMBULATORY_CARE_PROVIDER_SITE_OTHER): Payer: BC Managed Care – PPO | Admitting: Family Medicine

## 2015-11-16 VITALS — BP 122/92 | HR 87 | Temp 97.5°F | Ht 64.0 in | Wt 210.4 lb

## 2015-11-16 DIAGNOSIS — F411 Generalized anxiety disorder: Secondary | ICD-10-CM | POA: Diagnosis not present

## 2015-11-16 DIAGNOSIS — R41840 Attention and concentration deficit: Secondary | ICD-10-CM

## 2015-11-16 NOTE — Progress Notes (Signed)
BP 122/92 mmHg  Pulse 87  Temp(Src) 97.5 F (36.4 C) (Oral)  Ht 5\' 4"  (1.626 m)  Wt 210 lb 6.4 oz (95.437 kg)  BMI 36.10 kg/m2   Subjective:    Patient ID: Deanna Friedman, female    DOB: May 29, 1975, 40 y.o.   MRN: [id number]  HPI: Deanna Friedman is a 40 y.o. female presenting on 11/16/2015 for ADHD   HPI Anxiety and attention issues Patient is coming in today because she has been having increased anxiety and attention issues going on for the past couple years. She says she's also been more anxious with the increased work load that she's taken on over the past couple years. She denies any suicidal ideations. She does have issues with sleeping because her mind is racing and keeping her up. She denies any issues with depression or sadness or feeling down. She does say that both the anxiety and the attention have been causing issues with her work and she can't focus and she can't remember things. She says that she has been on medications for  Relevant past medical, surgical, family and social history reviewed and updated as indicated. Interim medical history since our last visit reviewed. Allergies and medications reviewed and updated.  Review of Systems  Constitutional: Negative for fever and chills.  HENT: Negative for congestion, ear discharge and ear pain.   Eyes: Negative for redness and visual disturbance.  Respiratory: Negative for chest tightness and shortness of breath.   Cardiovascular: Negative for chest pain and leg swelling.  Genitourinary: Negative for dysuria and difficulty urinating.  Musculoskeletal: Negative for back pain and gait problem.  Skin: Negative for rash.  Neurological: Negative for dizziness, light-headedness and headaches.  Psychiatric/Behavioral: Negative for behavioral problems and agitation.  All other systems reviewed and are negative.   Per HPI unless specifically indicated above     Medication List       This list is accurate as of: 11/16/15   9:26 AM.  Always use your most recent med list.               albuterol 108 (90 Base) MCG/ACT inhaler  Commonly known as:  PROVENTIL HFA;VENTOLIN HFA  Inhale 2 puffs into the lungs every 6 (six) hours as needed for wheezing or shortness of breath.     ANTIHISTAMINE PO  Take by mouth 4 (four) times daily as needed.     EPINEPHrine 0.3 mg/0.3 mL Soaj injection  Commonly known as:  EPIPEN  Inject 0.3 mLs (0.3 mg total) into the muscle once.     ibuprofen 800 MG tablet  Commonly known as:  ADVIL,MOTRIN  Take 800 mg by mouth every 8 (eight) hours as needed for pain.     losartan-hydrochlorothiazide 50-12.5 MG tablet  Commonly known as:  HYZAAR  TAKE 1 TABLET BY MOUTH EVERY DAY     medroxyPROGESTERone 150 MG/ML injection  Commonly known as:  DEPO-PROVERA  INJECT INTRAMUSCULARLY EVERY 3 MONTHS     meperidine 50 MG tablet  Commonly known as:  DEMEROL  50 mg as needed.     mometasone 0.1 % cream  Commonly known as:  ELOCON  Apply 1 application topically daily.     nystatin-triamcinolone ointment  Commonly known as:  MYCOLOG  Apply 1 application topically 2 (two) times daily.     rizatriptan 10 MG tablet  Commonly known as:  MAXALT  Take 10 mg by mouth as needed. Reported on 11/16/2015     traMADol 50  MG tablet  Commonly known as:  ULTRAM  Take 50 mg by mouth every 6 (six) hours as needed for pain.           Objective:    BP 122/92 mmHg  Pulse 87  Temp(Src) 97.5 F (36.4 C) (Oral)  Ht 5\' 4"  (1.626 m)  Wt 210 lb 6.4 oz (95.437 kg)  BMI 36.10 kg/m2  Wt Readings from Last 3 Encounters:  11/16/15 210 lb 6.4 oz (95.437 kg)  08/19/15 204 lb (92.534 kg)  04/11/15 196 lb (88.905 kg)    Physical Exam  Constitutional: She is oriented to person, place, and time. She appears well-developed and well-nourished. No distress.  Eyes: Conjunctivae and EOM are normal. Pupils are equal, round, and reactive to light.  Neck: Neck supple. No thyromegaly present.  Cardiovascular:  Normal rate, regular rhythm, normal heart sounds and intact distal pulses.   No murmur heard. Pulmonary/Chest: Effort normal and breath sounds normal. No respiratory distress. She has no wheezes.  Musculoskeletal: Normal range of motion. She exhibits no edema or tenderness.  Lymphadenopathy:    She has no cervical adenopathy.  Neurological: She is alert and oriented to person, place, and time. Coordination normal.  Skin: Skin is warm and dry. No rash noted. She is not diaphoretic.  Psychiatric: Her behavior is normal. Thought content normal. Her mood appears anxious. She does not exhibit a depressed mood. She expresses no suicidal ideation. She expresses no suicidal plans.  Nursing note and vitals reviewed.     Assessment & Plan:   Problem List Items Addressed This Visit    None    Visit Diagnoses    Anxiety state    -  Primary    Relevant Orders    Ambulatory referral to Psychiatry    Attention and concentration deficit        Relevant Orders    Ambulatory referral to Psychiatry        Follow up plan: Return if symptoms worsen or fail to improve.  Counseling provided for all of the vaccine components Orders Placed This Encounter  Procedures  . Ambulatory referral to Psychiatry    Arville CareJoshua Dettinger, MD [facility] 11/16/2015, 9:26 AM

## 2015-11-21 ENCOUNTER — Ambulatory Visit: Payer: Self-pay | Admitting: Adult Health

## 2015-11-21 ENCOUNTER — Ambulatory Visit: Payer: BC Managed Care – PPO | Admitting: Adult Health

## 2015-11-30 ENCOUNTER — Other Ambulatory Visit: Payer: Self-pay | Admitting: Adult Health

## 2015-12-06 ENCOUNTER — Other Ambulatory Visit (HOSPITAL_COMMUNITY): Payer: Self-pay | Admitting: Respiratory Therapy

## 2015-12-06 DIAGNOSIS — G4733 Obstructive sleep apnea (adult) (pediatric): Secondary | ICD-10-CM

## 2015-12-20 ENCOUNTER — Ambulatory Visit: Payer: Self-pay | Admitting: Adult Health

## 2016-01-03 ENCOUNTER — Ambulatory Visit: Payer: Self-pay | Admitting: Adult Health

## 2016-01-13 ENCOUNTER — Ambulatory Visit (INDEPENDENT_AMBULATORY_CARE_PROVIDER_SITE_OTHER): Payer: BC Managed Care – PPO | Admitting: Adult Health

## 2016-01-13 ENCOUNTER — Encounter: Payer: Self-pay | Admitting: Adult Health

## 2016-01-13 VITALS — BP 100/64 | HR 80 | Ht 64.0 in | Wt 211.0 lb

## 2016-01-13 DIAGNOSIS — Z3042 Encounter for surveillance of injectable contraceptive: Secondary | ICD-10-CM

## 2016-01-13 DIAGNOSIS — E669 Obesity, unspecified: Secondary | ICD-10-CM

## 2016-01-13 DIAGNOSIS — Z1212 Encounter for screening for malignant neoplasm of rectum: Secondary | ICD-10-CM | POA: Diagnosis not present

## 2016-01-13 DIAGNOSIS — Z01419 Encounter for gynecological examination (general) (routine) without abnormal findings: Secondary | ICD-10-CM

## 2016-01-13 DIAGNOSIS — I10 Essential (primary) hypertension: Secondary | ICD-10-CM

## 2016-01-13 LAB — HEMOCCULT GUIAC POC 1CARD (OFFICE): Fecal Occult Blood, POC: NEGATIVE

## 2016-01-13 MED ORDER — LOSARTAN POTASSIUM 50 MG PO TABS: 50.0000 mg | ORAL_TABLET | Freq: Every day | ORAL | 6 refills | Status: DC

## 2016-01-13 MED ORDER — MEDROXYPROGESTERONE ACETATE 150 MG/ML IM SUSP: 150.0000 mg | INTRAMUSCULAR | 4 refills | Status: DC

## 2016-01-13 MED ORDER — NYSTATIN-TRIAMCINOLONE 100000-0.1 UNIT/GM-% EX CREA: 1.0000 "application " | TOPICAL_CREAM | Freq: Two times a day (BID) | CUTANEOUS | 3 refills | Status: DC

## 2016-01-13 NOTE — Patient Instructions (Signed)
Physical and pap in 1 year Recheck BP in 6 weeks Mammogram advised

## 2016-01-13 NOTE — Progress Notes (Signed)
Patient ID: Deanna Friedman, female   DOB: 1976-03-21, 40 y.o.   MRN: [id number] History of Present Illness: Lennox LaityJodi is a 40 year old white female, married in for a well woman gyn exam, she had a normal pap 03/31/14.She is on depo and her mom who is a nurse has been giving them to her.She was seen in ER 3/31 and had K+ level of 2.9 and is on supplement and has not had recheck.She has been in prednisone for hives and has gained weight, [facility]She works at [employer]. PCP is [facility].     Current Medications, Allergies, Past Medical History, Past Surgical History, Family History and Social History were reviewed in [facility] electronic medical record.     Review of Systems: Patient denies any headaches, hearing loss, fatigue, blurred vision, shortness of breath, chest pain, abdominal pain, problems with bowel movements, urination, or intercourse(not having sex currently). No joint pain or mood swings. See HPI for positives.    Physical Exam:BP 100/64 (BP Location: Left Arm, Patient Position: Sitting, Cuff Size: Large)   Pulse 80   Ht 5\' 4"  (1.626 m)   Wt 211 lb (95.7 kg)   BMI 36.22 kg/m  General:  Well developed, well nourished, no acute distress Skin:  Warm and dry Neck:  Midline trachea, normal thyroid, good ROM, no lymphadenopathy Lungs; Clear to auscultation bilaterally Breast:  No dominant palpable mass, retraction, or nipple discharge Cardiovascular: Regular rate and rhythm Abdomen:  Soft, non tender, no hepatosplenomegaly Pelvic:  External genitalia is normal in appearance, no lesions.  The vagina is normal in appearance. Urethra has no lesions or masses. The cervix is bulbous.  Uterus is felt to be normal size, shape, and contour.  No adnexal masses or tenderness noted.Bladder is non tender, no masses felt. Rectal: Good sphincter tone, no polyps, or hemorrhoids felt.  Hemoccult negative. Extremities/musculoskeletal:  No swelling or varicosities noted, no clubbing  or cyanosis Psych:  No mood changes, alert and cooperative,seems happy Discussed stopping depo and she does not want to do that says she likes not having as period, will work on her weight, she says.   Impression: Well woman exam with routine gynecological exam - Plan: CBC, Comprehensive metabolic panel, TSH, Lipid panel, POCT occult blood stool, Hemoglobin A1c, VITAMIN D 25 Hydroxy (Vit-D Deficiency, Fractures)  Essential hypertension  Obesity  Encounter for surveillance of injectable contraceptive     Plan: Check CBC,CMP,TSH and lipids,A1c and vitamin D Rx depo provera 150 mg disp 1 for IM injection every 3 months with 4 refills Stop hyzaar Rx losartan 50 mg #30 take 1 daily with 6 refills  Refilled mytrex cream use bid prn with 3 refills  Pap and physical in 1 year Mammogram advised now and yearly(past due) Return in 6 weeks to check BP

## 2016-01-14 LAB — COMPREHENSIVE METABOLIC PANEL
ALT: 45 IU/L — AB (ref 0–32)
AST: 35 IU/L (ref 0–40)
Albumin/Globulin Ratio: 1.7 (ref 1.2–2.2)
Albumin: 4.2 g/dL (ref 3.5–5.5)
Alkaline Phosphatase: 62 IU/L (ref 39–117)
BUN/Creatinine Ratio: 19 (ref 9–23)
BUN: 15 mg/dL (ref 6–24)
Bilirubin Total: 0.9 mg/dL (ref 0.0–1.2)
CALCIUM: 9.3 mg/dL (ref 8.7–10.2)
CHLORIDE: 100 mmol/L (ref 96–106)
CO2: 24 mmol/L (ref 18–29)
Creatinine, Ser: 0.78 mg/dL (ref 0.57–1.00)
GFR, EST AFRICAN AMERICAN: 108 mL/min/{1.73_m2} (ref >59)
GFR, EST NON AFRICAN AMERICAN: 94 mL/min/{1.73_m2} (ref >59)
GLUCOSE: 88 mg/dL (ref 65–99)
Globulin, Total: 2.5 g/dL (ref 1.5–4.5)
Potassium: 3.7 mmol/L (ref 3.5–5.2)
Sodium: 141 mmol/L (ref 134–144)
TOTAL PROTEIN: 6.7 g/dL (ref 6.0–8.5)

## 2016-01-14 LAB — CBC
HEMATOCRIT: 42.2 % (ref 34.0–46.6)
Hemoglobin: 14.2 g/dL (ref 11.1–15.9)
MCH: 31.8 pg (ref 26.6–33.0)
MCHC: 33.6 g/dL (ref 31.5–35.7)
MCV: 95 fL (ref 79–97)
Platelets: 229 10*3/uL (ref 150–379)
RBC: 4.46 x10E6/uL (ref 3.77–5.28)
RDW: 13.2 % (ref 12.3–15.4)
WBC: 4.4 10*3/uL (ref 3.4–10.8)

## 2016-01-14 LAB — LIPID PANEL
CHOL/HDL RATIO: 3.9 ratio (ref 0.0–4.4)
CHOLESTEROL TOTAL: 140 mg/dL (ref 100–199)
HDL: 36 mg/dL — AB (ref >39)
LDL CALC: 63 mg/dL (ref 0–99)
TRIGLYCERIDES: 207 mg/dL — AB (ref 0–149)
VLDL Cholesterol Cal: 41 mg/dL — ABNORMAL HIGH (ref 5–40)

## 2016-01-14 LAB — VITAMIN D 25 HYDROXY (VIT D DEFICIENCY, FRACTURES): VIT D 25 HYDROXY: 28.7 ng/mL — AB (ref 30.0–100.0)

## 2016-01-14 LAB — HEMOGLOBIN A1C
Est. average glucose Bld gHb Est-mCnc: 111 mg/dL
Hgb A1c MFr Bld: 5.5 % (ref 4.8–5.6)

## 2016-01-14 LAB — TSH: TSH: 0.986 u[IU]/mL (ref 0.450–4.500)

## 2016-01-16 ENCOUNTER — Telehealth: Payer: Self-pay | Admitting: Adult Health

## 2016-01-16 NOTE — Telephone Encounter (Signed)
Pt aware of labs, and is taking vitamin D 3 now

## 2016-02-23 ENCOUNTER — Ambulatory Visit: Payer: BC Managed Care – PPO

## 2016-02-23 ENCOUNTER — Encounter: Payer: Self-pay | Admitting: *Deleted

## 2016-02-23 ENCOUNTER — Other Ambulatory Visit: Payer: Self-pay | Admitting: Adult Health

## 2016-02-23 ENCOUNTER — Ambulatory Visit (INDEPENDENT_AMBULATORY_CARE_PROVIDER_SITE_OTHER): Payer: BC Managed Care – PPO | Admitting: *Deleted

## 2016-02-23 VITALS — BP 120/80

## 2016-02-23 DIAGNOSIS — Z3042 Encounter for surveillance of injectable contraceptive: Secondary | ICD-10-CM | POA: Diagnosis not present

## 2016-02-23 DIAGNOSIS — Z3202 Encounter for pregnancy test, result negative: Secondary | ICD-10-CM | POA: Diagnosis not present

## 2016-02-23 DIAGNOSIS — Z308 Encounter for other contraceptive management: Secondary | ICD-10-CM

## 2016-02-23 LAB — POCT URINE PREGNANCY: Preg Test, Ur: NEGATIVE

## 2016-02-23 MED ORDER — MEDROXYPROGESTERONE ACETATE 150 MG/ML IM SUSP
150.0000 mg | Freq: Once | INTRAMUSCULAR | Status: AC
  Administered 2016-02-23: 150 mg via INTRAMUSCULAR

## 2016-02-23 MED ORDER — HYDROCHLOROTHIAZIDE 12.5 MG PO CAPS: 12.5000 mg | ORAL_CAPSULE | Freq: Every day | ORAL | 3 refills | Status: DC

## 2016-02-23 NOTE — Progress Notes (Signed)
Pt here for Depo and BP check. BP 120/80. Pt spoke with JAG and wants to be put back on fluid pill. JAG to send to pharmacy. Pt tolerated shot well. Return in 12 weeks for next shot. JSY

## 2016-05-17 ENCOUNTER — Encounter: Payer: Self-pay | Admitting: *Deleted

## 2016-05-17 ENCOUNTER — Ambulatory Visit (INDEPENDENT_AMBULATORY_CARE_PROVIDER_SITE_OTHER): Payer: BC Managed Care – PPO | Admitting: *Deleted

## 2016-05-17 ENCOUNTER — Encounter (INDEPENDENT_AMBULATORY_CARE_PROVIDER_SITE_OTHER): Payer: Self-pay

## 2016-05-17 DIAGNOSIS — Z3202 Encounter for pregnancy test, result negative: Secondary | ICD-10-CM | POA: Diagnosis not present

## 2016-05-17 DIAGNOSIS — Z3042 Encounter for surveillance of injectable contraceptive: Secondary | ICD-10-CM | POA: Diagnosis not present

## 2016-05-17 DIAGNOSIS — Z308 Encounter for other contraceptive management: Secondary | ICD-10-CM

## 2016-05-17 LAB — POCT URINE PREGNANCY: Preg Test, Ur: NEGATIVE

## 2016-05-17 MED ORDER — MEDROXYPROGESTERONE ACETATE 150 MG/ML IM SUSP
150.0000 mg | Freq: Once | INTRAMUSCULAR | Status: AC
  Administered 2016-05-17: 150 mg via INTRAMUSCULAR

## 2016-05-17 NOTE — Progress Notes (Signed)
Pt here for Depo. Pt tolerated shot well. Return in 12 weeks for next shot. JSY 

## 2016-06-21 ENCOUNTER — Telehealth: Payer: Self-pay | Admitting: *Deleted

## 2016-06-21 MED ORDER — FLUCONAZOLE 150 MG PO TABS: ORAL_TABLET | ORAL | 1 refills | Status: DC

## 2016-06-21 NOTE — Telephone Encounter (Signed)
Spoke with pt. Pt has a yeast infection and is requesting Diflucan. Thanks!! JSY

## 2016-06-21 NOTE — Telephone Encounter (Signed)
Will rx diflucan  

## 2016-06-29 ENCOUNTER — Other Ambulatory Visit: Payer: Self-pay | Admitting: Adult Health

## 2016-07-19 ENCOUNTER — Ambulatory Visit: Payer: BC Managed Care – PPO | Admitting: Adult Health

## 2016-07-31 ENCOUNTER — Ambulatory Visit: Payer: BC Managed Care – PPO | Admitting: Adult Health

## 2016-08-07 ENCOUNTER — Encounter: Payer: Self-pay | Admitting: Adult Health

## 2016-08-07 ENCOUNTER — Encounter (INDEPENDENT_AMBULATORY_CARE_PROVIDER_SITE_OTHER): Payer: Self-pay

## 2016-08-07 ENCOUNTER — Ambulatory Visit (INDEPENDENT_AMBULATORY_CARE_PROVIDER_SITE_OTHER): Payer: BC Managed Care – PPO | Admitting: Adult Health

## 2016-08-07 VITALS — BP 116/80 | HR 75 | Ht 64.0 in | Wt 213.0 lb

## 2016-08-07 DIAGNOSIS — R61 Generalized hyperhidrosis: Secondary | ICD-10-CM

## 2016-08-07 DIAGNOSIS — Z3009 Encounter for other general counseling and advice on contraception: Secondary | ICD-10-CM

## 2016-08-07 DIAGNOSIS — Z713 Dietary counseling and surveillance: Secondary | ICD-10-CM

## 2016-08-07 DIAGNOSIS — Z6836 Body mass index (BMI) 36.0-36.9, adult: Secondary | ICD-10-CM | POA: Diagnosis not present

## 2016-08-07 NOTE — Patient Instructions (Signed)
Call if wants IUD Decrease carbs and increase exercise

## 2016-08-07 NOTE — Progress Notes (Signed)
Subjective:     Patient ID: Margaretmary BayleyJodi H Prows, female   DOB: [birth year], 41 y.o.   MRN: [id number]  HPI Lennox LaityJodi is a 41 year old white female, married in to discuss birth control, on depo.Has night sweats and breast tenderness, and weight gain.She is eating salads and going to yoga now.  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 Reviewed medications and allergies.     Objective:   Physical Exam BP 116/80 (BP Location: Left Arm, Patient Position: Sitting, Cuff Size: Large)   Pulse 75   Ht 5\' 4"  (1.626 m)   Wt 213 lb (96.6 kg)   BMI 36.56 kg/m  Skin warm and dry. Neck: mid line trachea, normal thyroid, good ROM, no lymphadenopathy noted. Lungs: clear to ausculation bilaterally. Cardiovascular: regular rate and rhythm.   Discussed stopping depo, and trying IUD or tubal.Decrease carbs and increase exercise and let me know what she wants to do regarding birth control. POP not option due to cherry allergic, and COC not option due to migraine with aura.  Face time 15 minutes with 50 % counseling and talking as above.  Assessment:     1. Contraceptive use education   2. Encounter for weight loss counseling   3. Body mass index 36.0-36.9, adult   4. Night sweats       Plan:    Review handout on IUD Decrease carbs and increase exercise Follow up prn

## 2016-08-09 ENCOUNTER — Telehealth: Payer: Self-pay | Admitting: *Deleted

## 2016-08-09 NOTE — Telephone Encounter (Signed)
Pt has decided to stop depo for now, and just see how she feels

## 2016-08-13 ENCOUNTER — Ambulatory Visit: Payer: BC Managed Care – PPO

## 2016-08-15 ENCOUNTER — Other Ambulatory Visit: Payer: Self-pay | Admitting: Adult Health

## 2016-09-11 ENCOUNTER — Ambulatory Visit (INDEPENDENT_AMBULATORY_CARE_PROVIDER_SITE_OTHER): Payer: BC Managed Care – PPO | Admitting: Family Medicine

## 2016-09-11 ENCOUNTER — Encounter: Payer: Self-pay | Admitting: Family Medicine

## 2016-09-11 VITALS — BP 134/80 | HR 84 | Temp 98.2°F | Resp 16 | Ht 64.0 in | Wt 207.0 lb

## 2016-09-11 DIAGNOSIS — Z2821 Immunization not carried out because of patient refusal: Secondary | ICD-10-CM | POA: Diagnosis not present

## 2016-09-11 DIAGNOSIS — Z6835 Body mass index (BMI) 35.0-35.9, adult: Secondary | ICD-10-CM

## 2016-09-11 DIAGNOSIS — Z889 Allergy status to unspecified drugs, medicaments and biological substances status: Secondary | ICD-10-CM

## 2016-09-11 DIAGNOSIS — J4520 Mild intermittent asthma, uncomplicated: Secondary | ICD-10-CM | POA: Insufficient documentation

## 2016-09-11 DIAGNOSIS — I10 Essential (primary) hypertension: Secondary | ICD-10-CM | POA: Diagnosis not present

## 2016-09-11 DIAGNOSIS — G43111 Migraine with aura, intractable, with status migrainosus: Secondary | ICD-10-CM

## 2016-09-11 DIAGNOSIS — Z7689 Persons encountering health services in other specified circumstances: Secondary | ICD-10-CM

## 2016-09-11 NOTE — Progress Notes (Addendum)
Chief Complaint  Patient presents with  . Establish Care   Patient is here new to establish. Her old records are available on the chart. This office is more convenient to her home. She sees Dr. Gerilyn Pilgrim for chronic migraines. Her migraines are difficult to manage. She has a prescription for by mouth Demerol that she takes as needed. We discussed that there are better medications for prevention and treatment of migraine, however she states she has tried a multitude of medications and this is the best for her. She also has tramadol she can use when necessary. She is under the care of GYN. She is currently using depo shots for birth control. She is upset about weight gain. She has stopped the shots and at current weight she starts having periods again to decide what her other choices are. She is having hot flashes and thinks she may be perimenopausal. She is obese. She cries when she talks about her weight. She states she has "tried everything". She does yoga several days a week. She tries to watch what she is eating. She wants to discuss what other options are available for her. I discussed nutritional consult,, which she says she has done. I discussed referral to bariatrics for surgery or medical consult. I discussed referral to Dr. Fransico Him in endocrinology to check her metabolism since she feels like her diet and exercises appropriate. I discussed the variety of medicines that are available however she is very sensitive to medications. Patient tells me she has multiple food allergies. They came on rather suddenly. She has to  be very careful of what she eats. States she can't have any pitted fruit, shellfish, coconut, or tree nuts. She carries an EpiPen. She has adult asthma. She uses her inhalers when necessary.  Patient Active Problem List   Diagnosis Date Noted  . Immunization refused 09/11/2016  . Intermittent asthma without complication 09/11/2016  . Well woman exam with routine gynecological  exam 01/13/2016  . Contraceptive management 03/31/2014  . Obesity 10/14/2013  . Migraines   . HTN (hypertension)   . Multiple allergies     Outpatient Encounter Prescriptions as of 09/11/2016  Medication Sig  . albuterol (PROVENTIL HFA;VENTOLIN HFA) 108 (90 BASE) MCG/ACT inhaler Inhale 2 puffs into the lungs every 6 (six) hours as needed for wheezing or shortness of breath.  . beclomethasone (QVAR) 40 MCG/ACT inhaler Inhale 1 puff into the lungs 3 times/day as needed-between meals & bedtime.  Marland Kitchen Bioflavonoid Products (VITAMIN C) CHEW Chew by mouth daily.  . Black Cohosh 540 MG CAPS Take by mouth.  . Cholecalciferol (VITAMIN D PO) Take by mouth daily.  . Cyanocobalamin (VITAMIN B12 PO) Take by mouth daily.  . diphenhydrAMINE (BENADRYL) 25 MG tablet Take 25 mg by mouth every 6 (six) hours as needed.  Marland Kitchen EPINEPHrine (EPIPEN) 0.3 mg/0.3 mL IJ SOAJ injection Inject 0.3 mLs (0.3 mg total) into the muscle once.  . hydrochlorothiazide (MICROZIDE) 12.5 MG capsule TAKE 1 CAPSULE(12.5 MG) BY MOUTH DAILY  . ibuprofen (ADVIL,MOTRIN) 800 MG tablet Take 800 mg by mouth every 8 (eight) hours as needed.  . Loratadine 10 MG CAPS Take by mouth.  . losartan (COZAAR) 50 MG tablet TAKE 1 TABLET(50 MG) BY MOUTH DAILY  . Melatonin 3 MG CAPS Take by mouth.  . meperidine (DEMEROL) 50 MG tablet 50 mg as needed.   . mometasone (ELOCON) 0.1 % cream Apply 1 application topically daily. (Patient taking differently: Apply 1 application topically as needed. )  .  nystatin-triamcinolone (MYCOLOG II) cream Apply 1 application topically 2 (two) times daily.  . traMADol (ULTRAM) 50 MG tablet Take 50 mg by mouth every 6 (six) hours as needed for pain.   No facility-administered encounter medications on file as of 09/11/2016.     Past Medical History:  Diagnosis Date  . ADHD (attention deficit hyperactivity disorder)   . Allergy    food, meds, mold, dust  . Anemia   . Asthma   . Blood transfusion without reported  diagnosis    childbirth  . Body aches 11/29/2014  . BV (bacterial vaginosis) 10/14/2013  . Contraceptive management 03/31/2014  . Decreased sex drive 9/60/4540  . Folliculitis 01/27/2015  . Heart murmur   . HTN (hypertension)   . Migraines   . Multiple allergies   . Obesity 10/14/2013  . Vaginal itching 12/30/2014    Past Surgical History:  Procedure Laterality Date  . BREAST LUMPECTOMY Right    fibroadenoma  . DILATION AND CURETTAGE OF UTERUS    . KNEE ARTHROSCOPY      Social History   Social History  . Marital status: Married    Spouse name: Asher Muir  . Number of children: 3  . Years of education: 21   Occupational History  . teacher     [employer]   Social History Main Topics  . Smoking status: Former Smoker    Types: Cigarettes  . Smokeless tobacco: Never Used  . Alcohol use Yes     Comment: social  . Drug use: No  . Sexual activity: Yes   Other Topics Concern  . 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 Hypertension Mother   . Heart disease Mother   . Alcohol abuse Mother   . Arthritis Mother   . Asthma Mother   . Depression Mother   . Alcohol abuse Maternal Grandfather   . Hypertension Paternal Grandmother   . Diabetes Paternal Grandmother   . Cancer Paternal Grandfather     lung  . Graves' disease Sister   . Other Maternal Grandmother     was murdered  . Early death Maternal Grandmother   . Alcohol abuse Father   . Arthritis Father     Review of Systems  Constitutional: Negative for chills, fever and weight loss.  HENT: Negative for congestion and hearing loss.   Eyes: Negative for blurred vision and pain.  Respiratory: Negative for cough and shortness of breath.   Cardiovascular: Negative for chest pain and leg swelling.  Gastrointestinal: Negative for abdominal pain, constipation, diarrhea and heartburn.    Genitourinary: Negative for dysuria and frequency.  Musculoskeletal: Negative for falls, joint pain and myalgias.  Skin:       Multiple allergies cause hives  Neurological: Positive for headaches. Negative for dizziness and seizures.  Psychiatric/Behavioral: Positive for depression. The patient is not nervous/anxious and does not have insomnia.        Frustrated and upset over weight    BP 134/80 (BP Location: Right Arm, Patient Position: Sitting, Cuff Size: Normal)   Pulse 84   Temp 98.2 F (36.8 C) (Temporal)   Resp 16   Ht  (1.626 m)   Wt 207 lb (93.9 kg)   SpO2 99%   BMI 35.53 kg/m   Physical Exam  Constitutional:  She is oriented to person, place, and time. She appears well-developed and well-nourished. She appears distressed.  HENT:  Head: Normocephalic and atraumatic.  Mouth/Throat: Oropharynx is clear and moist.  Eyes: Conjunctivae are normal. Pupils are equal, round, and reactive to light.  Neck: Normal range of motion. Neck supple. No thyromegaly present.  Cardiovascular: Normal rate, regular rhythm and normal heart sounds.   No murmur heard. No murmur  Pulmonary/Chest: Effort normal and breath sounds normal. No respiratory distress.  Abdominal: Soft. Bowel sounds are normal.  Musculoskeletal: Normal range of motion. She exhibits no edema.  Lymphadenopathy:    She has no cervical adenopathy.  Neurological: She is alert and oriented to person, place, and time.  Gait normal  Skin: Skin is warm and dry.  Psychiatric: Her mood appears anxious. She is agitated.  Patient is talkative. Labile. Difficult to directed times.  Nursing note and vitals reviewed.   1. Encounter to establish care with new doctor  2. Intractable migraine with aura with status migrainosus Under care of neurology  3. Multiple allergies Most food allergies, managed by avoidance. Takes loratadine or Benadryl for allergy symptoms.  4. Immunization refused Afraid of shots because of her  allergies.  5. Body mass index (BMI) 35.0-35.9, adult  - Ambulatory referral to Endocrinology  6. Essential hypertension  - CBC - Comprehensive metabolic panel - Lipid panel - VITAMIN D 25 Hydroxy (Vit-D Deficiency, Fractures) - Urinalysis, Routine w reflex microscopic Greater than 50% of this visit was spent in counseling and coordinating care.  Total face to face time:   minutes. Mostly discussing calories, metabolism, weight, exercise recommendations   Patient Instructions  Will refer to endocrinology to check metabolism Continue under care Dr Gerilyn Pilgrim  See me in a month or two    Eustace Moore, MD

## 2016-09-11 NOTE — Patient Instructions (Signed)
Will refer to endocrinology to check metabolism Continue under care Dr Gerilyn Pilgrim  See me in a month or two

## 2016-10-02 ENCOUNTER — Encounter: Payer: Self-pay | Admitting: Sports Medicine

## 2016-10-02 ENCOUNTER — Ambulatory Visit (INDEPENDENT_AMBULATORY_CARE_PROVIDER_SITE_OTHER): Payer: BC Managed Care – PPO | Admitting: Sports Medicine

## 2016-10-02 ENCOUNTER — Ambulatory Visit (INDEPENDENT_AMBULATORY_CARE_PROVIDER_SITE_OTHER): Payer: BC Managed Care – PPO

## 2016-10-02 DIAGNOSIS — M79671 Pain in right foot: Secondary | ICD-10-CM | POA: Diagnosis not present

## 2016-10-02 DIAGNOSIS — M722 Plantar fascial fibromatosis: Secondary | ICD-10-CM | POA: Diagnosis not present

## 2016-10-02 MED ORDER — MELOXICAM 15 MG PO TABS: 15.0000 mg | ORAL_TABLET | Freq: Every day | ORAL | 0 refills | Status: DC

## 2016-10-02 MED ORDER — TRIAMCINOLONE ACETONIDE 10 MG/ML IJ SUSP: 10.0000 mg | Freq: Once | INTRAMUSCULAR | Status: DC

## 2016-10-02 NOTE — Patient Instructions (Signed)

## 2016-10-02 NOTE — Progress Notes (Signed)
Subjective: Deanna Friedman is a 41 y.o. female patient presents to office with complaint of heel pain on the right. Patient admits to post static dyskinesia for 7 months in duration. Patient has treated this problem with stretching, icing, changing shoes with no relief. Reports that the neoprene wrap helped a little. Denies any other pedal complaints.   Reports that she is a Runner, broadcasting/film/videoteacher and stands a lot that sometimes with extensive standing and walking She feels a pulling or tearing sensation in her right heel.  Patient Active Problem List   Diagnosis Date Noted  . Immunization refused 09/11/2016  . Intermittent asthma without complication 09/11/2016  . Well woman exam with routine gynecological exam 01/13/2016  . Contraceptive management 03/31/2014  . Obesity 10/14/2013  . Migraines   . HTN (hypertension)   . Multiple allergies     Current Outpatient Prescriptions on File Prior to Visit  Medication Sig Dispense Refill  . albuterol (PROVENTIL HFA;VENTOLIN HFA) 108 (90 BASE) MCG/ACT inhaler Inhale 2 puffs into the lungs every 6 (six) hours as needed for wheezing or shortness of breath. 1 Inhaler 0  . beclomethasone (QVAR) 40 MCG/ACT inhaler Inhale 1 puff into the lungs 3 times/day as needed-between meals & bedtime.    Marland Kitchen. Bioflavonoid Products (VITAMIN C) CHEW Chew by mouth daily.    . Black Cohosh 540 MG CAPS Take by mouth.    . Cholecalciferol (VITAMIN D PO) Take by mouth daily.    . Cyanocobalamin (VITAMIN B12 PO) Take by mouth daily.    . diphenhydrAMINE (BENADRYL) 25 MG tablet Take 25 mg by mouth every 6 (six) hours as needed.    Marland Kitchen. EPINEPHrine (EPIPEN) 0.3 mg/0.3 mL IJ SOAJ injection Inject 0.3 mLs (0.3 mg total) into the muscle once. 1 Device 0  . hydrochlorothiazide (MICROZIDE) 12.5 MG capsule TAKE 1 CAPSULE(12.5 MG) BY MOUTH DAILY 30 capsule 6  . ibuprofen (ADVIL,MOTRIN) 800 MG tablet Take 800 mg by mouth every 8 (eight) hours as needed.    . Loratadine 10 MG CAPS Take by mouth.    .  losartan (COZAAR) 50 MG tablet TAKE 1 TABLET(50 MG) BY MOUTH DAILY 30 tablet 6  . Melatonin 3 MG CAPS Take by mouth.    . meperidine (DEMEROL) 50 MG tablet 50 mg as needed.   0  . mometasone (ELOCON) 0.1 % cream Apply 1 application topically daily. (Patient taking differently: Apply 1 application topically as needed. ) 45 g 0  . nystatin-triamcinolone (MYCOLOG II) cream Apply 1 application topically 2 (two) times daily. 30 g 3  . St Johns Wort 300 MG CAPS Take by mouth daily.    . traMADol (ULTRAM) 50 MG tablet Take 50 mg by mouth every 6 (six) hours as needed for pain.    Marland Kitchen. UNABLE TO FIND Valerian-once daily     No current facility-administered medications on file prior to visit.     Allergies  Allergen Reactions  . Nutritional Supplements Anaphylaxis    Pitted fruit, highly reactive to cherries  . Shellfish Allergy Anaphylaxis  . Aloe Hives  . Cephalexin Other (See Comments)    Severe headache  . Coconut Fatty Acids Hives    Coconut eaten  . Dust Mite Extract Hives  . Food Hives    Tree nuts  . Percodan [Oxycodone-Aspirin] Other (See Comments)    Serious headache    Objective: Physical Exam General: The patient is alert and oriented x3 in no acute distress.  Dermatology: Skin is warm, dry and supple  bilateral lower extremities. Nails 1-10 are normal. There is no erythema, edema, no eccymosis, no open lesions present. Integument is otherwise unremarkable.  Vascular: Dorsalis Pedis pulse and Posterior Tibial pulse are 2/4 bilateral. Capillary fill time is immediate to all digits.  Neurological: Grossly intact to light touch with an achilles reflex of +2/5 and a  negative Tinel's sign bilateral.  Musculoskeletal: Tenderness to palpation at the medial calcaneal tubercale and through the insertion of the plantar fascia on the right foot. No pain with compression of calcaneus bilateral. No pain with tuning fork to calcaneus bilateral. No pain with calf compression bilateral. There  is decreased Ankle joint range of motion bilateral. All other joints range of motion within normal limits bilateral. Strength 5/5 in all groups bilateral.   Gait: Unassisted, Antalgic avoid weight on right heel  Xray, Right foot:  Normal osseous mineralization. Joint spaces preserved. No fracture/dislocation/boney destruction. Calcaneal spur present with mild thickening of plantar fascia. No other soft tissue abnormalities or radiopaque foreign bodies.   Assessment and Plan: Problem List Items Addressed This Visit    None    Visit Diagnoses    Plantar fasciitis of right foot    -  Primary   Relevant Medications   triamcinolone acetonide (KENALOG) 10 MG/ML injection 10 mg   meloxicam (MOBIC) 15 MG tablet   Right foot pain       Relevant Orders   DG Foot 2 Views Right (Completed)      -Complete examination performed.  -Xrays reviewed -Discussed with patient in detail the condition of plantar fasciitis, how this occurs and general treatment options. Explained both conservative and surgical treatments.  -After oral consent and aseptic prep, injected a mixture containing 1 ml of 2%  plain lidocaine, 1 ml 0.5% plain marcaine, 0.5 ml of kenalog 10 and 0.5 ml of dexamethasone phosphate into left/right heel. Post-injection care discussed with patient.  -Rx Meloxicam. Patient refused steroid dose pack and states that she does not want to take steroids by mouth. Because she had an issue with significant weight gain and is going to see endocrinologist from past use of steroids. -Recommended good supportive shoes and advised use of OTC insert. Explained to patient that if these orthoses work well, we will continue with these. If these do not improve her condition and  pain, we will consider custom molded orthoses. - Explained in detail the use of the fascial brace for the right which was dispensed at today's visit. -Explained and dispensed to patient daily stretching exercises. -Recommend patient  to ice affected area 1-2x daily. -Patient to return to office in 4 weeks for follow up or sooner if problems or questions arise.  Asencion Islam, DPM

## 2016-10-09 ENCOUNTER — Encounter: Payer: Self-pay | Admitting: "Endocrinology

## 2016-10-09 ENCOUNTER — Ambulatory Visit (INDEPENDENT_AMBULATORY_CARE_PROVIDER_SITE_OTHER): Payer: BC Managed Care – PPO | Admitting: "Endocrinology

## 2016-10-09 ENCOUNTER — Other Ambulatory Visit: Payer: Self-pay

## 2016-10-09 VITALS — BP 123/87 | HR 93 | Ht 64.0 in | Wt 211.0 lb

## 2016-10-09 DIAGNOSIS — E6609 Other obesity due to excess calories: Secondary | ICD-10-CM

## 2016-10-09 DIAGNOSIS — E7800 Pure hypercholesterolemia, unspecified: Secondary | ICD-10-CM | POA: Diagnosis not present

## 2016-10-09 DIAGNOSIS — Z6836 Body mass index (BMI) 36.0-36.9, adult: Principal | ICD-10-CM

## 2016-10-09 HISTORY — DX: Pure hypercholesterolemia, unspecified: E78.00

## 2016-10-09 MED ORDER — PHENTERMINE-TOPIRAMATE ER 3.75-23 MG PO CP24: 1.0000 | ORAL_CAPSULE | Freq: Every morning | ORAL | 0 refills | Status: DC

## 2016-10-09 MED ORDER — PHENTERMINE-TOPIRAMATE ER 7.5-46 MG PO CP24: 7.5000 mg | ORAL_CAPSULE | Freq: Every day | ORAL | 2 refills | Status: DC

## 2016-10-09 NOTE — Patient Instructions (Signed)

## 2016-10-09 NOTE — Progress Notes (Signed)
Subjective:    Patient ID: Deanna Friedman, female    DOB: 24-May-1975, PCP Dettinger, Elige Radon, MD   Past Medical History:  Diagnosis Date  . ADHD (attention deficit hyperactivity disorder)   . Allergy    food, meds, mold, dust  . Anemia   . Asthma   . Blood transfusion without reported diagnosis    childbirth  . Body aches 11/29/2014  . BV (bacterial vaginosis) 10/14/2013  . Contraceptive management 03/31/2014  . Decreased sex drive 1/32/4401  . Folliculitis 01/27/2015  . Heart murmur   . HTN (hypertension)   . Hypercholesteremia 10/09/2016  . Migraines   . Multiple allergies   . Obesity 10/14/2013  . Vaginal itching 12/30/2014   Past Surgical History:  Procedure Laterality Date  . BREAST LUMPECTOMY Right    fibroadenoma  . DILATION AND CURETTAGE OF UTERUS    . KNEE ARTHROSCOPY     Social History   Social History  . Marital status: Married    Spouse name: Asher Muir  . Number of children: 3  . Years of education: 32   Occupational History  . teacher     [employer]   Social History Main Topics  . Smoking status: Former Smoker    Types: Cigarettes  . Smokeless tobacco: Never Used  . Alcohol use Yes     Comment: social  . Drug use: No  . Sexual activity: Yes   Other Topics Concern  . None   Social History Narrative   Wellsite geologist   Lives with Asher Muir   2 children yet at home   Yoga      Outpatient Encounter Prescriptions as of 10/09/2016  Medication Sig  . albuterol (PROVENTIL HFA;VENTOLIN HFA) 108 (90 BASE) MCG/ACT inhaler Inhale 2 puffs into the lungs every 6 (six) hours as needed for wheezing or shortness of breath.  . beclomethasone (QVAR) 40 MCG/ACT inhaler Inhale 1 puff into the lungs 3 times/day as needed-between meals & bedtime.  Marland Kitchen Bioflavonoid Products (VITAMIN C) CHEW Chew by mouth daily.  . Black Cohosh 540 MG CAPS Take by mouth.  . Cholecalciferol (VITAMIN D PO) Take by mouth daily.  . Cyanocobalamin (VITAMIN B12 PO) Take by mouth daily.   . diphenhydrAMINE (BENADRYL) 25 MG tablet Take 25 mg by mouth every 6 (six) hours as needed.  Marland Kitchen EPINEPHrine (EPIPEN) 0.3 mg/0.3 mL IJ SOAJ injection Inject 0.3 mLs (0.3 mg total) into the muscle once.  . hydrochlorothiazide (MICROZIDE) 12.5 MG capsule TAKE 1 CAPSULE(12.5 MG) BY MOUTH DAILY  . ibuprofen (ADVIL,MOTRIN) 800 MG tablet Take 800 mg by mouth every 8 (eight) hours as needed.  . Loratadine 10 MG CAPS Take by mouth.  . losartan (COZAAR) 50 MG tablet TAKE 1 TABLET(50 MG) BY MOUTH DAILY  . Melatonin 3 MG CAPS Take by mouth.  . meloxicam (MOBIC) 15 MG tablet Take 1 tablet (15 mg total) by mouth daily.  . meperidine (DEMEROL) 50 MG tablet 50 mg as needed.   . mometasone (ELOCON) 0.1 % cream Apply 1 application topically daily. (Patient taking differently: Apply 1 application topically as needed. )  . nystatin-triamcinolone (MYCOLOG II) cream Apply 1 application topically 2 (two) times daily.  . Phentermine-Topiramate (QSYMIA) 3.75-23 MG CP24 Take 1 capsule by mouth every morning.  Marland Kitchen Phentermine-Topiramate (QSYMIA) 7.5-46 MG CP24 Take 7.5 mg by mouth daily with breakfast.  . St Johns Wort 300 MG CAPS Take by mouth daily.  . traMADol (ULTRAM) 50 MG tablet Take 50 mg  by mouth every 6 (six) hours as needed for pain.  Marland Kitchen. UNABLE TO FIND Valerian-once daily   Facility-Administered Encounter Medications as of 10/09/2016  Medication  . triamcinolone acetonide (KENALOG) 10 MG/ML injection 10 mg   ALLERGIES: Allergies  Allergen Reactions  . Nutritional Supplements Anaphylaxis    Pitted fruit, highly reactive to cherries  . Shellfish Allergy Anaphylaxis  . Aloe Hives  . Cephalexin Other (See Comments)    Severe headache  . Coconut Fatty Acids Hives    Coconut eaten  . Dust Mite Extract Hives  . Food Hives    Tree nuts  . Percodan [Oxycodone-Aspirin] Other (See Comments)    Serious headache    VACCINATION STATUS:  There is no immunization history on file for this  patient.  HPI 41 year old female with medical history as above. She is being seen in consultation for weight management requested by Dettinger, Elige RadonJoshua A, MD. - She reports progressive weight gain over the last 5-10 years. She had intermittent exposure to high-dose steroids related to arthritis and dermatitis. - She has tried to lose weight by doing modifications in her diet and exercise. - She denies family history of Cushing's syndrome, thyroid dysfunction. - She has 3 kids. - Per her report, she has gained at least 15 pounds since last year. - She has polypharmacy, most of them supplements she is getting from the over-the-counter. She is on multiple pain medications including ibuprofen, meloxicam, and tramadol.  Review of Systems  Constitutional: + weight gain, + fatigue, no subjective hyperthermia, no subjective hypothermia Eyes: no blurry vision, no xerophthalmia ENT: no sore throat, no nodules palpated in throat, no dysphagia/odynophagia, no hoarseness Cardiovascular: no Chest Pain, no Shortness of Breath, no palpitations, no leg swelling Respiratory: no cough, no SOB Gastrointestinal: no Nausea/Vomiting/Diarhhea Musculoskeletal: no muscle/joint aches Skin: no rashes Neurological: no tremors, no numbness, no tingling, no dizziness Psychiatric: no depression, no anxiety  Objective:    BP 123/87   Pulse 93   Ht 5\' 4"  (1.626 m)   Wt 211 lb (95.7 kg)   BMI 36.22 kg/m   Wt Readings from Last 3 Encounters:  10/09/16 211 lb (95.7 kg)  09/11/16 207 lb (93.9 kg)  08/07/16 213 lb (96.6 kg)    Physical Exam  Constitutional: Obese, not in acute distress, normal state of mind Eyes: PERRLA, EOMI, no exophthalmos ENT: moist mucous membranes, no thyromegaly, no cervical lymphadenopathy Cardiovascular: normal precordial activity, Regular Rate and Rhythm, no Murmur/Rubs/Gallops Respiratory:  adequate breathing efforts, no gross chest deformity, Clear to auscultation  bilaterally Gastrointestinal: abdomen soft, Non -tender, No distension, Bowel Sounds present Musculoskeletal: no gross deformities, strength intact in all four extremities, + mild dorsocervical fat pad. Skin: moist, warm, no rashes, + she has purplish abdominal striae.  Neurological: no tremor with outstretched hands, Deep tendon reflexes normal in all four extremities.  CMP ( most recent) CMP     Component Value Date/Time   NA 141 01/13/2016 1121   K 3.7 01/13/2016 1121   CL 100 01/13/2016 1121   CO2 24 01/13/2016 1121   GLUCOSE 88 01/13/2016 1121   GLUCOSE 114 (H) 08/19/2015 1401   BUN 15 01/13/2016 1121   CREATININE 0.78 01/13/2016 1121   CREATININE 0.76 05/18/2013 0942   CALCIUM 9.3 01/13/2016 1121   PROT 6.7 01/13/2016 1121   ALBUMIN 4.2 01/13/2016 1121   AST 35 01/13/2016 1121   ALT 45 (H) 01/13/2016 1121   ALKPHOS 62 01/13/2016 1121   BILITOT 0.9 01/13/2016 1121  GFRNONAA 94 01/13/2016 1121   GFRAA 108 01/13/2016 1121     Diabetic Labs (most recent): Lab Results  Component Value Date   HGBA1C 5.5 01/13/2016     Lipid Panel ( most recent) Lipid Panel     Component Value Date/Time   CHOL 140 01/13/2016 1121   TRIG 207 (H) 01/13/2016 1121   TRIG 186 (H) 10/14/2014 1638   HDL 36 (L) 01/13/2016 1121   HDL 44 10/14/2014 1638   CHOLHDL 3.9 01/13/2016 1121   CHOLHDL 3.8 05/18/2013 0942   VLDL 33 05/18/2013 0942   LDLCALC 63 01/13/2016 1121       Assessment & Plan:   1. Class 2 obesity due to excess calories without serious comorbidity with body mass index (BMI) of 36.0 to 36.9 in adult  - Cause of her obesity is likely positive caloric intake. I will proceed to rule out endocrine etiologies including Cushing syndrome and hypothyroidism. - I had a long discussion with the patient regarding the need for negative caloric intake to bring about progressive weight loss. - I have counseled the patient on diet management  by adopting a carbohydrate  restricted/protein rich diet, and exercise regimen suitable for her  - Suggestion is made for patient to avoid simple carbohydrates   from her diet including Cakes , Desserts, Ice Cream,  Soda (  diet and regular) , Sweet Tea , Candies,  Chips, Cookies, Artificial Sweeteners,   and "Sugar-free" Products .   - I encouraged the patient to switch to  unprocessed or minimally processed complex starch and increased protein intake (animal or plant source), fruits, and vegetables.  - Patient is advised to stick to a routine mealtimes to eat 3 meals  a day and avoid unnecessary snacks ( to snack only to correct hypoglycemia).  - She will be scheduled with Norm Salt, RDN, CDE for individualized nutrition education. - It is encouraging to see her committed and wants to address her weight problem at this age because obesity will put  her at risk for more metabolic disorders including type 2 diabetes, hypertension, hyperlipidemia, sleep apnea, etc.  -  She denies history of glaucoma, not on any antidepressants, she is not planning for any more pregnancy (not sexually active).  She will benefit from the recently FDA approved weight loss medications. - I discussed and initiated Qsymia for her. The potential teratogenicity of qsymia is discussed with her and she agrees to avoid pregnancy while she is on this medication.   -  I spent half of the consult time educating her on type 2 diabetes and its prevention.  - 45 minutes of time was spent on the care of this patient . - I advised patient to maintain close follow up with Dettinger, Elige Radon, MD for primary care needs. Follow up plan: Return in about 2 weeks (around 10/23/2016) for follow up with pre-visit labs, 24 hour urine free Cortisol.  Marquis Lunch, MD Phone: [phone]  Fax: [phone]   10/09/2016, 3:27 PM

## 2016-10-10 LAB — TSH: TSH: 1.01 m[IU]/L

## 2016-10-10 LAB — T4, FREE: Free T4: 1.2 ng/dL (ref 0.8–1.8)

## 2016-10-20 LAB — CORTISOL, URINE, 24 HOUR
Cortisol (Ur), Free: 2.9 mcg/24 h — ABNORMAL LOW (ref 4.0–50.0)
RESULTS RECEIVED: 1.31 g/(24.h) (ref 0.63–2.50)

## 2016-10-23 ENCOUNTER — Encounter: Payer: Self-pay | Admitting: "Endocrinology

## 2016-10-23 ENCOUNTER — Ambulatory Visit (INDEPENDENT_AMBULATORY_CARE_PROVIDER_SITE_OTHER): Payer: BC Managed Care – PPO | Admitting: "Endocrinology

## 2016-10-23 VITALS — BP 124/72 | HR 76 | Ht 64.0 in | Wt 207.0 lb

## 2016-10-23 DIAGNOSIS — Z6836 Body mass index (BMI) 36.0-36.9, adult: Secondary | ICD-10-CM

## 2016-10-23 DIAGNOSIS — E6609 Other obesity due to excess calories: Secondary | ICD-10-CM | POA: Diagnosis not present

## 2016-10-23 MED ORDER — PHENTERMINE-TOPIRAMATE ER 3.75-23 MG PO CP24: 1.0000 | ORAL_CAPSULE | Freq: Every morning | ORAL | 0 refills | Status: DC

## 2016-10-23 MED ORDER — PHENTERMINE-TOPIRAMATE ER 7.5-46 MG PO CP24: 7.5000 mg | ORAL_CAPSULE | Freq: Every day | ORAL | 2 refills | Status: DC

## 2016-10-23 NOTE — Progress Notes (Signed)
Subjective:    Patient ID: Deanna Friedman, female    DOB: 1976-01-19, PCP Dettinger, Elige Radon, MD   Past Medical History:  Diagnosis Date  . ADHD (attention deficit hyperactivity disorder)   . Allergy    food, meds, mold, dust  . Anemia   . Asthma   . Blood transfusion without reported diagnosis    childbirth  . Body aches 11/29/2014  . BV (bacterial vaginosis) 10/14/2013  . Contraceptive management 03/31/2014  . Decreased sex drive 1/61/0960  . Folliculitis 01/27/2015  . Heart murmur   . HTN (hypertension)   . Hypercholesteremia 10/09/2016  . Migraines   . Multiple allergies   . Obesity 10/14/2013  . Vaginal itching 12/30/2014   Past Surgical History:  Procedure Laterality Date  . BREAST LUMPECTOMY Right    fibroadenoma  . DILATION AND CURETTAGE OF UTERUS    . KNEE ARTHROSCOPY     Social History   Social History  . Marital status: Married    Spouse name: Asher Muir  . Number of children: 3  . Years of education: 32   Occupational History  . teacher     [employer]   Social History Main Topics  . Smoking status: Former Smoker    Types: Cigarettes  . Smokeless tobacco: Never Used  . Alcohol use Yes     Comment: social  . Drug use: No  . Sexual activity: Yes   Other Topics Concern  . None   Social History Narrative   Wellsite geologist   Lives with Asher Muir   2 children yet at home   Yoga      Outpatient Encounter Prescriptions as of 10/23/2016  Medication Sig  . albuterol (PROVENTIL HFA;VENTOLIN HFA) 108 (90 BASE) MCG/ACT inhaler Inhale 2 puffs into the lungs every 6 (six) hours as needed for wheezing or shortness of breath.  . beclomethasone (QVAR) 40 MCG/ACT inhaler Inhale 1 puff into the lungs 3 times/day as needed-between meals & bedtime.  Marland Kitchen Bioflavonoid Products (VITAMIN C) CHEW Chew by mouth daily.  . Black Cohosh 540 MG CAPS Take by mouth.  . Cholecalciferol (VITAMIN D PO) Take by mouth daily.  . Cyanocobalamin (VITAMIN B12 PO) Take by mouth daily.  .  diphenhydrAMINE (BENADRYL) 25 MG tablet Take 25 mg by mouth every 6 (six) hours as needed.  Marland Kitchen EPINEPHrine (EPIPEN) 0.3 mg/0.3 mL IJ SOAJ injection Inject 0.3 mLs (0.3 mg total) into the muscle once.  . hydrochlorothiazide (MICROZIDE) 12.5 MG capsule TAKE 1 CAPSULE(12.5 MG) BY MOUTH DAILY  . ibuprofen (ADVIL,MOTRIN) 800 MG tablet Take 800 mg by mouth every 8 (eight) hours as needed.  . Loratadine 10 MG CAPS Take by mouth.  . losartan (COZAAR) 50 MG tablet TAKE 1 TABLET(50 MG) BY MOUTH DAILY  . Melatonin 3 MG CAPS Take by mouth.  . meperidine (DEMEROL) 50 MG tablet 50 mg as needed.   . mometasone (ELOCON) 0.1 % cream Apply 1 application topically daily. (Patient taking differently: Apply 1 application topically as needed. )  . nystatin-triamcinolone (MYCOLOG II) cream Apply 1 application topically 2 (two) times daily.  . Phentermine-Topiramate (QSYMIA) 3.75-23 MG CP24 Take 1 capsule by mouth every morning.  Marland Kitchen Phentermine-Topiramate (QSYMIA) 7.5-46 MG CP24 Take 7.5 mg by mouth daily with breakfast.  . St Johns Wort 300 MG CAPS Take by mouth daily.  . traMADol (ULTRAM) 50 MG tablet Take 50 mg by mouth every 6 (six) hours as needed for pain.  Marland Kitchen UNABLE TO FIND Valerian-once  daily  . [DISCONTINUED] meloxicam (MOBIC) 15 MG tablet Take 1 tablet (15 mg total) by mouth daily.  . [DISCONTINUED] Phentermine-Topiramate (QSYMIA) 3.75-23 MG CP24 Take 1 capsule by mouth every morning. (Patient not taking: Reported on 10/23/2016)  . [DISCONTINUED] Phentermine-Topiramate (QSYMIA) 7.5-46 MG CP24 Take 7.5 mg by mouth daily with breakfast. (Patient not taking: Reported on 10/23/2016)   Facility-Administered Encounter Medications as of 10/23/2016  Medication  . triamcinolone acetonide (KENALOG) 10 MG/ML injection 10 mg   ALLERGIES: Allergies  Allergen Reactions  . Nutritional Supplements Anaphylaxis    Pitted fruit, highly reactive to cherries  . Shellfish Allergy Anaphylaxis  . Aloe Hives  . Cephalexin Other (See  Comments)    Severe headache  . Coconut Fatty Acids Hives    Coconut eaten  . Dust Mite Extract Hives  . Food Hives    Tree nuts  . Percodan [Oxycodone-Aspirin] Other (See Comments)    Serious headache    VACCINATION STATUS:  There is no immunization history on file for this patient.  HPI 41 year old female with medical history as above. She is being seen in Follow-up for weight management requested by Dettinger, Elige RadonJoshua A, MD. - She reports progressive weight gain over the last 5-10 years. She had intermittent exposure to high-dose steroids related to arthritis and dermatitis. - She has tried to lose weight by doing modifications in her diet and exercise- lost 6 pounds since March 2018. - She denies family history of Cushing's syndrome, thyroid dysfunction. - Her lab work since her last visit did not show any evidence of these conditions. - She has 3 kids. - Per her report, she has gained at least 15 pounds since last year. - She has polypharmacy, most of them supplements she is getting from the over-the-counter. She is on multiple pain medications including ibuprofen, meloxicam, and tramadol.  Review of Systems  Constitutional: + Steady weight since last visit,  + fatigue, no subjective hyperthermia, no subjective hypothermia Eyes: no blurry vision, no xerophthalmia ENT: no sore throat, no nodules palpated in throat, no dysphagia/odynophagia, no hoarseness Cardiovascular: no Chest Pain, no Shortness of Breath, no palpitations, no leg swelling Respiratory: no cough, no SOB Gastrointestinal: no Nausea/Vomiting/Diarhhea Musculoskeletal: no muscle/joint aches Skin: no rashes Neurological: no tremors, no numbness, no tingling, no dizziness Psychiatric: no depression, no anxiety  Objective:    BP 124/72   Pulse 76   Ht 5\' 4"  (1.626 m)   Wt 207 lb (93.9 kg)   BMI 35.53 kg/m   Wt Readings from Last 3 Encounters:  10/23/16 207 lb (93.9 kg)  10/09/16 211 lb (95.7 kg)  09/11/16  207 lb (93.9 kg)    Physical Exam  Constitutional: Obese, not in acute distress, normal state of mind Eyes: PERRLA, EOMI, no exophthalmos ENT: moist mucous membranes, no thyromegaly, no cervical lymphadenopathy Cardiovascular: normal precordial activity, Regular Rate and Rhythm, no Murmur/Rubs/Gallops Respiratory:  adequate breathing efforts, no gross chest deformity, Clear to auscultation bilaterally Gastrointestinal: abdomen soft, Non -tender, No distension, Bowel Sounds present Musculoskeletal: no gross deformities, strength intact in all four extremities, + mild dorsocervical fat pad. Skin: moist, warm, no rashes, + she has purplish abdominal striae.  Neurological: no tremor with outstretched hands, Deep tendon reflexes normal in all four extremities.  Recent Results (from the past 2160 hour(s))  TSH     Status: None   Collection Time: 10/09/16  3:12 PM  Result Value Ref Range   TSH 1.01 mIU/L    Comment:   Reference Range   >  or = 20 Years  0.40-4.50   Pregnancy Range First trimester  0.26-2.66 Second trimester 0.55-2.73 Third trimester  0.43-2.91     T4, free     Status: None   Collection Time: 10/09/16  3:12 PM  Result Value Ref Range   Free T4 1.2 0.8 - 1.8 ng/dL  Cortisol, urine, 24 hour     Status: Abnormal   Collection Time: 10/16/16  4:13 PM  Result Value Ref Range   Cortisol (Ur), Free 2.9 (L) 4.0 - 50.0 mcg/24 h    Comment:   Analysis performed by [employer]   This test was developed and its analytical performance characteristics have been determined by [employer]. It has not been cleared or approved by FDA. This assay has been validated pursuant to the CLIA regulations and is used for clinical purposes.    RESULTS RECEIVED 1.31 0.63 - 2.50 g/24 h     Assessment & Plan:   1. Class 2 obesity due to excess calories  - Cause of her obesity is likely positive caloric intake.  Her labs helped  rule out endocrine etiologies including Cushing syndrome and hypothyroidism. - I had a long discussion with the patient regarding the need for negative caloric intake to bring about progressive weight loss. - I have counseled the patient on diet management  by adopting a carbohydrate restricted/protein rich diet, and exercise regimen suitable for her  - Suggestion is made for patient to avoid simple carbohydrates   from her diet including Cakes , Desserts, Ice Cream,  Soda (  diet and regular) , Sweet Tea , Candies,  Chips, Cookies, Artificial Sweeteners,   and "Sugar-free" Products .   - I encouraged the patient to switch to  unprocessed or minimally processed complex starch and increased protein intake (animal or plant source), fruits, and vegetables.  - Patient is advised to stick to a routine mealtimes to eat 3 meals  a day and avoid unnecessary snacks ( to snack only to correct hypoglycemia).  - She will be scheduled with Norm Salt, RDN, CDE for individualized nutrition education. - It is encouraging to see her committed and wants to address her weight problem at this age because obesity will put  her at risk for more metabolic disorders including type 2 diabetes, hypertension, hyperlipidemia, sleep apnea, etc.  -  She denies history of glaucoma, not on any antidepressants, she is not planning for any more pregnancy (not sexually active).  She will benefit from the recently FDA approved weight loss medications.  - I discussed and initiated Qsymia for her, last visit however, she lost her prescription and did not take it to the pharmacy. she wishes to have another prescription for same medication. The potential teratogenicity of qsymia is discussed with her and she agrees to avoid pregnancy while she is on this medication.   -  I spent half of the consult time educating her on type 2 diabetes and its prevention.  - 45 minutes of time was spent on the care of this patient . - I advised  patient to maintain close follow up with Dettinger, Elige Radon, MD for primary care needs. Follow up plan: Return in about 3 months (around 01/23/2017) for follow up with pre-visit labs.  Marquis Lunch, MD Phone: [phone]  Fax: [phone]   10/23/2016, 1:30 PM

## 2016-11-05 ENCOUNTER — Telehealth: Payer: Self-pay

## 2016-11-05 NOTE — Telephone Encounter (Signed)
Pt.notified

## 2016-11-05 NOTE — Telephone Encounter (Signed)
Pts insurance would not cover Qsymia. I explained to her that if her insurance did not cover this then it is most likely that it will not cover any other weight loss medications and she may have to continue diet and exercise. Pt still wants to know what else she can take?

## 2016-11-05 NOTE — Telephone Encounter (Signed)
You are wright, it is just that qsymia has high copay even though it says covered or preferred. I checked [employer] , it is same thing. She has to stick to diet/exercise by the guide I gave her.

## 2016-11-06 ENCOUNTER — Ambulatory Visit: Payer: BC Managed Care – PPO | Admitting: Podiatry

## 2016-11-06 ENCOUNTER — Ambulatory Visit (INDEPENDENT_AMBULATORY_CARE_PROVIDER_SITE_OTHER): Payer: BC Managed Care – PPO | Admitting: Family Medicine

## 2016-11-06 ENCOUNTER — Encounter: Payer: Self-pay | Admitting: Family Medicine

## 2016-11-06 VITALS — BP 108/70 | HR 100 | Temp 98.7°F | Resp 16 | Ht 64.0 in | Wt 203.1 lb

## 2016-11-06 DIAGNOSIS — Z6836 Body mass index (BMI) 36.0-36.9, adult: Secondary | ICD-10-CM

## 2016-11-06 DIAGNOSIS — N912 Amenorrhea, unspecified: Secondary | ICD-10-CM

## 2016-11-06 DIAGNOSIS — I10 Essential (primary) hypertension: Secondary | ICD-10-CM

## 2016-11-06 DIAGNOSIS — E6609 Other obesity due to excess calories: Secondary | ICD-10-CM | POA: Diagnosis not present

## 2016-11-06 LAB — CBC
HEMATOCRIT: 42.5 % (ref 35.0–45.0)
Hemoglobin: 14.5 g/dL (ref 11.7–15.5)
MCH: 32.6 pg (ref 27.0–33.0)
MCHC: 34.1 g/dL (ref 32.0–36.0)
MCV: 95.5 fL (ref 80.0–100.0)
MPV: 10.2 fL (ref 7.5–12.5)
Platelets: 244 10*3/uL (ref 140–400)
RBC: 4.45 MIL/uL (ref 3.80–5.10)
RDW: 13.3 % (ref 11.0–15.0)
WBC: 6.7 10*3/uL (ref 3.8–10.8)

## 2016-11-06 MED ORDER — MOMETASONE FUROATE 0.1 % EX CREA: 1.0000 "application " | TOPICAL_CREAM | Freq: Every day | CUTANEOUS | 1 refills | Status: DC

## 2016-11-06 NOTE — Progress Notes (Signed)
Chief Complaint  Patient presents with  . Follow-up    1 month  is slowly losing weight Not really serious about exercise yet.  This is discussed Is taking an appetite suppressant.  No side effects. Spoke to Field seismologist. Headaches have been better Still has not menstruated after stopping the depomedrol injection 3 months ago.  Wants to know if she is in menopause.  It is early, but we can check her FSH and LH BP is well controlled No recent labs Wants to get a TdaP in Dec, her first grandchild is due in Jan  Patient Active Problem List   Diagnosis Date Noted  . Hypercholesteremia 10/09/2016  . Immunization refused 09/11/2016  . Intermittent asthma without complication 09/11/2016  . Well woman exam with routine gynecological exam 01/13/2016  . Contraceptive management 03/31/2014  . Class 2 obesity due to excess calories without serious comorbidity with body mass index (BMI) of 36.0 to 36.9 in adult 10/14/2013  . Migraines   . HTN (hypertension)   . Multiple allergies     Outpatient Encounter Prescriptions as of 11/06/2016  Medication Sig  . albuterol (PROVENTIL HFA;VENTOLIN HFA) 108 (90 BASE) MCG/ACT inhaler Inhale 2 puffs into the lungs every 6 (six) hours as needed for wheezing or shortness of breath.  . beclomethasone (QVAR) 40 MCG/ACT inhaler Inhale 1 puff into the lungs 3 times/day as needed-between meals & bedtime.  Marland Kitchen Bioflavonoid Products (VITAMIN C) CHEW Chew by mouth daily.  . Black Cohosh 540 MG CAPS Take by mouth.  . Cholecalciferol (VITAMIN D PO) Take by mouth daily.  . Cyanocobalamin (VITAMIN B12 PO) Take by mouth daily.  . diphenhydrAMINE (BENADRYL) 25 MG tablet Take 25 mg by mouth every 6 (six) hours as needed.  Marland Kitchen EPINEPHrine (EPIPEN) 0.3 mg/0.3 mL IJ SOAJ injection Inject 0.3 mLs (0.3 mg total) into the muscle once.  . hydrochlorothiazide (MICROZIDE) 12.5 MG capsule TAKE 1 CAPSULE(12.5 MG) BY MOUTH DAILY  . ibuprofen (ADVIL,MOTRIN) 800 MG tablet Take  800 mg by mouth every 8 (eight) hours as needed.  . Loratadine 10 MG CAPS Take by mouth.  . losartan (COZAAR) 50 MG tablet TAKE 1 TABLET(50 MG) BY MOUTH DAILY  . Melatonin 3 MG CAPS Take by mouth.  . meperidine (DEMEROL) 50 MG tablet 50 mg as needed.   . mometasone (ELOCON) 0.1 % cream Apply 1 application topically daily.  Marland Kitchen nystatin-triamcinolone (MYCOLOG II) cream Apply 1 application topically 2 (two) times daily.  . Phentermine-Topiramate (QSYMIA) 3.75-23 MG CP24 Take 1 capsule by mouth every morning.  Marland Kitchen Phentermine-Topiramate (QSYMIA) 7.5-46 MG CP24 Take 7.5 mg by mouth daily with breakfast.  . St Johns Wort 300 MG CAPS Take by mouth daily.  . traMADol (ULTRAM) 50 MG tablet Take 50 mg by mouth every 6 (six) hours as needed for pain.  Marland Kitchen UNABLE TO FIND Valerian-once daily  . [DISCONTINUED] mometasone (ELOCON) 0.1 % cream Apply 1 application topically daily. (Patient taking differently: Apply 1 application topically as needed. )   Facility-Administered Encounter Medications as of 11/06/2016  Medication  . triamcinolone acetonide (KENALOG) 10 MG/ML injection 10 mg    Allergies  Allergen Reactions  . Nutritional Supplements Anaphylaxis    Pitted fruit, highly reactive to cherries  . Shellfish Allergy Anaphylaxis  . Aloe Hives  . Cephalexin Other (See Comments)    Severe headache  . Coconut Fatty Acids Hives    Coconut eaten  . Dust Mite Extract Hives  . Food Hives  Tree nuts  . Percodan [Oxycodone-Aspirin] Other (See Comments)    Serious headache    Review of Systems  Constitutional: Positive for appetite change. Negative for activity change.  HENT: Negative for congestion, dental problem, postnasal drip and rhinorrhea.   Eyes: Negative for redness and visual disturbance.  Respiratory: Negative for cough and shortness of breath.   Cardiovascular: Negative for chest pain, palpitations and leg swelling.  Gastrointestinal: Negative for abdominal pain, constipation and diarrhea.   Genitourinary: Positive for menstrual problem. Negative for difficulty urinating and frequency.  Musculoskeletal: Negative for arthralgias and back pain.  Neurological: Negative for dizziness and headaches.       No headache  Psychiatric/Behavioral: Positive for dysphoric mood. Negative for sleep disturbance. The patient is not nervous/anxious.        Labile    BP 108/70 (BP Location: Right Arm, Patient Position: Sitting, Cuff Size: Normal)   Pulse 100   Temp 98.7 F (37.1 C) (Temporal)   Resp 16   Ht 5\' 4"  (1.626 m)   Wt 203 lb 1.9 oz (92.1 kg)   SpO2 98%   BMI 34.87 kg/m   Physical Exam  Constitutional: She is oriented to person, place, and time. She appears well-developed and well-nourished. No distress.  HENT:  Head: Normocephalic and atraumatic.  Eyes: Conjunctivae are normal. Pupils are equal, round, and reactive to light.  Cardiovascular: Normal rate, regular rhythm and normal heart sounds.   Pulmonary/Chest: Effort normal and breath sounds normal. No respiratory distress.  Lymphadenopathy:    She has no cervical adenopathy.  Neurological: She is alert and oriented to person, place, and time.  Psychiatric: She has a normal mood and affect. Her behavior is normal. Thought content normal.    ASSESSMENT/PLAN:  1. Amenorrhea  - FSH - LH  2. Essential hypertension  3. Obesity - improved   Patient Instructions  Get the hormonal tests today Walk every day that you are able congratulations on your weight loss See me in six months Call sooner for problems    Eustace MooreYvonne Sue Lief Palmatier, MD

## 2016-11-06 NOTE — Patient Instructions (Addendum)
Get the hormonal tests today Walk every day that you are able congratulations on your weight loss See me in six months Call sooner for problems

## 2016-11-07 ENCOUNTER — Encounter: Payer: Self-pay | Admitting: Family Medicine

## 2016-11-07 LAB — COMPREHENSIVE METABOLIC PANEL
ALK PHOS: 58 U/L (ref 33–115)
ALT: 47 U/L — AB (ref 6–29)
AST: 28 U/L (ref 10–30)
Albumin: 4.4 g/dL (ref 3.6–5.1)
BUN: 26 mg/dL — ABNORMAL HIGH (ref 7–25)
CALCIUM: 9.8 mg/dL (ref 8.6–10.2)
CHLORIDE: 106 mmol/L (ref 98–110)
CO2: 26 mmol/L (ref 20–31)
Creat: 0.83 mg/dL (ref 0.50–1.10)
Glucose, Bld: 80 mg/dL (ref 65–99)
POTASSIUM: 3.8 mmol/L (ref 3.5–5.3)
Sodium: 141 mmol/L (ref 135–146)
Total Bilirubin: 0.6 mg/dL (ref 0.2–1.2)
Total Protein: 7 g/dL (ref 6.1–8.1)

## 2016-11-07 LAB — URINALYSIS, MICROSCOPIC ONLY
CRYSTALS: NONE SEEN [HPF]
Casts: NONE SEEN [LPF]
SQUAMOUS EPITHELIAL / LPF: NONE SEEN [HPF] (ref <=5)
Yeast: NONE SEEN [HPF]

## 2016-11-07 LAB — URINALYSIS, ROUTINE W REFLEX MICROSCOPIC
BILIRUBIN URINE: NEGATIVE
GLUCOSE, UA: NEGATIVE
Ketones, ur: NEGATIVE
Nitrite: NEGATIVE
PH: 5 (ref 5.0–8.0)
Protein, ur: NEGATIVE
SPECIFIC GRAVITY, URINE: 1.022 (ref 1.001–1.035)

## 2016-11-07 LAB — LIPID PANEL
CHOL/HDL RATIO: 3.7 ratio (ref <5.0)
Cholesterol: 139 mg/dL (ref <200)
HDL: 38 mg/dL — ABNORMAL LOW (ref >50)
LDL Cholesterol: 68 mg/dL (ref <100)
TRIGLYCERIDES: 167 mg/dL — AB (ref <150)
VLDL: 33 mg/dL — ABNORMAL HIGH (ref <30)

## 2016-11-07 LAB — VITAMIN D 25 HYDROXY (VIT D DEFICIENCY, FRACTURES): Vit D, 25-Hydroxy: 35 ng/mL (ref 30–100)

## 2016-11-07 LAB — LUTEINIZING HORMONE: LH: 6.8 m[IU]/mL

## 2016-11-07 LAB — FOLLICLE STIMULATING HORMONE: FSH: 5.7 m[IU]/mL

## 2016-11-19 ENCOUNTER — Telehealth: Payer: Self-pay | Admitting: Adult Health

## 2016-11-19 MED ORDER — MEDROXYPROGESTERONE ACETATE 150 MG/ML IM SUSP: 150.0000 mg | INTRAMUSCULAR | 4 refills | Status: DC

## 2016-11-19 NOTE — Telephone Encounter (Signed)
Pt called stating that she would like to go back on Depo, Pt would like a call back from [place]. Please contact pt

## 2016-11-19 NOTE — Telephone Encounter (Signed)
Wants depo refilled, get stat QHCG in am and depo afternoon, has not had sex in about a month

## 2016-11-20 ENCOUNTER — Ambulatory Visit: Payer: BC Managed Care – PPO

## 2016-11-20 ENCOUNTER — Ambulatory Visit (INDEPENDENT_AMBULATORY_CARE_PROVIDER_SITE_OTHER): Payer: BC Managed Care – PPO

## 2016-11-20 ENCOUNTER — Other Ambulatory Visit: Payer: BC Managed Care – PPO

## 2016-11-20 VITALS — Wt 204.0 lb

## 2016-11-20 DIAGNOSIS — Z3042 Encounter for surveillance of injectable contraceptive: Secondary | ICD-10-CM

## 2016-11-20 LAB — BETA HCG QUANT (REF LAB): hCG Quant: <1 m[IU]/mL

## 2016-11-20 MED ORDER — MEDROXYPROGESTERONE ACETATE 150 MG/ML IM SUSP
150.0000 mg | Freq: Once | INTRAMUSCULAR | Status: AC
  Administered 2016-11-20: 150 mg via INTRAMUSCULAR

## 2016-11-20 NOTE — Progress Notes (Signed)
PT here for depo shot 150 mg IM given LT Deltoid. Tolerated well.Serum HCG Negative. Return 12 weeks for next shot. Pad CMA

## 2016-11-26 ENCOUNTER — Ambulatory Visit: Payer: BC Managed Care – PPO | Admitting: Nutrition

## 2017-01-17 ENCOUNTER — Telehealth: Payer: Self-pay

## 2017-01-17 NOTE — Telephone Encounter (Signed)
Deanna Friedman's is supposed to have Knee surgery hopefully on 01-29-17. They want her to stop the Qsymia after today. She is questioning how safe this will be for her.

## 2017-01-17 NOTE — Telephone Encounter (Signed)
It is safe to interrupt, she may resume on next day of  her  clearance for oral intake.

## 2017-01-17 NOTE — Telephone Encounter (Signed)
Pt.notified

## 2017-01-28 ENCOUNTER — Other Ambulatory Visit: Payer: Self-pay | Admitting: Adult Health

## 2017-01-29 HISTORY — PX: ANTERIOR CRUCIATE LIGAMENT REPAIR: SHX115

## 2017-02-01 ENCOUNTER — Ambulatory Visit (HOSPITAL_COMMUNITY): Payer: BC Managed Care – PPO | Attending: Specialist

## 2017-02-01 ENCOUNTER — Encounter (HOSPITAL_COMMUNITY): Payer: Self-pay

## 2017-02-01 DIAGNOSIS — M6281 Muscle weakness (generalized): Secondary | ICD-10-CM | POA: Insufficient documentation

## 2017-02-01 DIAGNOSIS — R600 Localized edema: Secondary | ICD-10-CM | POA: Diagnosis present

## 2017-02-01 DIAGNOSIS — R262 Difficulty in walking, not elsewhere classified: Secondary | ICD-10-CM | POA: Diagnosis present

## 2017-02-01 DIAGNOSIS — M25561 Pain in right knee: Secondary | ICD-10-CM | POA: Diagnosis present

## 2017-02-01 DIAGNOSIS — M25662 Stiffness of left knee, not elsewhere classified: Secondary | ICD-10-CM | POA: Diagnosis present
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
## 2017-02-01 NOTE — Patient Instructions (Signed)
  Gastrocnemius Stretch  Sitting with your back straight against a wall and both legs outstretched in front of you, wrap a towel or belt around the ball of your foot. Maintaining a straight back and without slouching, pulling your foot and the towel toward your body for a stretch on the back of your calf. Hold this stretch statically for 30 seconds.  Perform 2-3x/day, 3-5 stretches holding for 30-60 seconds    QUAD SET  Tighten your top thigh muscle as you attempt to press the back of your knee downward towards the table. Tighten R thigh as well to help with the L quad contraction.  Perform 2x/day, 2-3 sets of 10 reps holding for 3-5 seconds    HEEL SLIDES - LONG SIT WITH TOWEL AND BELT  While in a sitting position, place a small hand towel under your heel. Next, loop a belt, towel or bed sheet around your foot and pull your knee into a bend position as your foot slides towards your buttock. Hold a gentle stretch and then return back to original position.  Can lie on back or sit up  Perform 2x/day, 2-3 sets of 10 reps holding for 3-5 seconds

## 2017-02-04 ENCOUNTER — Ambulatory Visit (HOSPITAL_COMMUNITY): Payer: BC Managed Care – PPO | Admitting: Physical Therapy

## 2017-02-04 ENCOUNTER — Ambulatory Visit: Payer: BC Managed Care – PPO | Admitting: "Endocrinology

## 2017-02-04 DIAGNOSIS — M25561 Pain in right knee: Secondary | ICD-10-CM | POA: Diagnosis not present

## 2017-02-04 DIAGNOSIS — M6281 Muscle weakness (generalized): Secondary | ICD-10-CM

## 2017-02-04 DIAGNOSIS — R262 Difficulty in walking, not elsewhere classified: Secondary | ICD-10-CM

## 2017-02-04 DIAGNOSIS — R600 Localized edema: Secondary | ICD-10-CM

## 2017-02-04 DIAGNOSIS — M25662 Stiffness of left knee, not elsewhere classified: Secondary | ICD-10-CM

## 2017-02-04 NOTE — Therapy (Signed)
[place] [facility] [address] [place], [place], [address] Phone: [phone]   Fax:  [phone]  Physical Therapy Treatment  Patient Details  Name: Deanna Friedman MRN: [id number] Date of Birth: 01/02/1976 Referring Provider: Arlan Organ, MD  Encounter Date: 02/04/2017      PT End of Session - 02/04/17 1130    Visit Number 2   Number of Visits 19   Date for PT Re-Evaluation 02/22/17   Authorization Type [employer]   Authorization Time Period 02/01/17 to 03/15/17   PT Start Time 1040   PT Stop Time 1130   PT Time Calculation (min) 50 min   Equipment Utilized During Treatment Left knee immobilizer;Other (comment)  bil axillary crutches   Activity Tolerance Patient tolerated treatment well;No increased pain   Behavior During Therapy WFL for tasks assessed/performed      Past Medical History:  Diagnosis Date  . ADHD (attention deficit hyperactivity disorder)   . Allergy    food, meds, mold, dust  . Anemia   . Asthma   . Blood transfusion without reported diagnosis    childbirth  . Body aches 11/29/2014  . BV (bacterial vaginosis) 10/14/2013  . Contraceptive management 03/31/2014  . Decreased sex drive 6/44/0347  . Folliculitis 01/27/2015  . Heart murmur   . HTN (hypertension)   . Hypercholesteremia 10/09/2016  . Migraines   . Multiple allergies   . Obesity 10/14/2013  . Vaginal itching 12/30/2014    Past Surgical History:  Procedure Laterality Date  . BREAST LUMPECTOMY Right    fibroadenoma  . DILATION AND CURETTAGE OF UTERUS    . KNEE ARTHROSCOPY      There were no vitals filed for this visit.      Subjective Assessment - 02/04/17 1124    Subjective Pt states she is having more swelling and pain.  comes today using bil axillary crutches, TTWB, TED hose and brace in place.  Returns to Dr. Thomasena Edis 9/24.   Currently in Pain? Yes   Pain Score 5    Pain Location Knee   Pain Orientation Left   Pain Descriptors /  Indicators Sharp;Squeezing;Throbbing   Pain Frequency Intermittent            [facility] PT Assessment - 02/04/17 0001      Assessment   Medical Diagnosis L ACL Allograft     Precautions   Precautions Knee   Precaution Comments follow [facility] ACL Allograft Reconstruction Protocol   Required Braces or Orthoses Knee Immobilizer - Left   Knee Immobilizer - Left On when out of bed or walking;Other (comment)     Restrictions   Weight Bearing Restrictions Yes   LLE Weight Bearing Touchdown weight bearing   Other Position/Activity Restrictions with bil axillary crutches     Circumferential Edema   Circumferential - Right 15" joint line   Circumferential - Left  17" joint line     AROM   AROM Assessment Site Knee   Right/Left Knee Left   Left Knee Extension -15   Left Knee Flexion 65                     [facility] Adult PT Treatment/Exercise - 02/04/17 0001      Knee/Hip Exercises: Supine   Quad Sets Left;10 reps   Short Arc Quad Sets Left;10 reps   Heel Slides Left;10 reps   Knee Extension Limitations -15  was -17   Knee Flexion Limitations 65  was 48     Modalities   Modalities Electrical Stimulation;Cryotherapy     Cryotherapy   Number Minutes Cryotherapy 10 Minutes   Cryotherapy Location Knee   Type of Cryotherapy Ice pack     Electrical Stimulation   Electrical Stimulation Location Lt knee (medial and lateral)   Electrical Stimulation Action Hi volt to decrease swelling   Electrical Stimulation Parameters Hi volt 80/120 PPS sweep at 100Volts 15 minutes with ice   Electrical Stimulation Goals Edema     Manual Therapy   Manual Therapy Edema management   Manual therapy comments completed following therex and prior to Hi volt estim   Edema Management retro massage to decrease edema                PT Education - 02/04/17 1130    Education provided Yes   Education Details reviewed evaluation goals and measurements.  Reviewed HEP.   Educated on difference between TEDS and compression garments.  Order faxed for compression   Person(s) Educated Patient   Methods Explanation;Demonstration;Verbal cues;Handout;Tactile cues   Comprehension Verbalized understanding;Returned demonstration;Tactile cues required;Verbal cues required          PT Short Term Goals - 02/01/17 1229      PT SHORT TERM GOAL #1   Title Pt will be independent with HEP and perform consistently in order to maximize return to PLOF.   Time 3   Period Weeks   Target Date 02/22/17     PT SHORT TERM GOAL #2   Title Pt will have improved L knee AROM to 5-100 deg to decrease pain and maximize overall function.   Time 3   Period Weeks   Status New     PT SHORT TERM GOAL #3   Title Pt will have improved MMT of hip musculature to 5/5 to maximize gait.    Time 3   Period Weeks   Status New     PT SHORT TERM GOAL #4   Title Pt will have decreased swelling to at least 15" on the L at joint line to decrease pain and maximize ROM.   Time 3   Period Weeks   Status New           PT Long Term Goals - 02/01/17 1232      PT LONG TERM GOAL #1   Title Pt will have improved L knee AROM to at least 0-120 deg to demo improved overall function and maximize sitting tolerance, gait, and stair ambulation   Time 6   Period Weeks   Status New   Target Date 03/15/17     PT LONG TERM GOAL #2   Title Pt will have at least 4/5 L knee MMT to maximize gait and return to PLOF.   Time 6   Period Weeks   Status New     PT LONG TERM GOAL #3   Title Pt will be able to perform SLS for at least 10 sec on LLE to demo improved function and maximize gait.    Baseline (once she is able to fully bear weight through LLE)   Time 6   Period Weeks   Status New     PT LONG TERM GOAL #4   Title Other long term goals will be updated once pt's WB precautions are lifted and pt is able to be out of her immobilizer and further objective testing can be performed. Jac Canavan,  PT, DPT  Plan - 02/04/17 1147    Clinical Impression Statement Pt with increased swelling this session.  Measure mid patellar circumference with additional inch gain since 3 days prior.  Educated on compression garments and order faxed to MD requesting to help reduce swelling.  Minimal ROM, however improved from last session at 15-65 (was 17-48).   Reduction of swelling by 1/2" following manual and addition of hi-volt/ice for swelling.  Pt also reported reduction in pain.     Rehab Potential Good   PT Frequency 3x / week   PT Duration 6 weeks   PT Treatment/Interventions ADLs/Self Care Home Management;Cryotherapy;Electrical Stimulation;Moist Heat;Ultrasound;DME Instruction;Gait training;Stair training;Functional mobility training;Therapeutic activities;Therapeutic exercise;Balance training;Neuromuscular re-education;Patient/family education;Manual techniques;Wheelchair mobility training;Passive range of motion;Scar mobilization;Dry needling;Energy conservation;Taping   PT Next Visit Plan Follow [facility] Orthopedic ACL Allograft Reconstruction Protocol; (further objective testing should be performed once pt is FWB on LLE). NWB ROM exercises, can do e-stim for improved quad contraction if needed.  follow up with affects of hivolt for swelling.  give compression order to patient when received.     PT Home Exercise Plan eval: supine calf stretch with rope, heel slides, quad sets   Consulted and Agree with Plan of Care Patient      Patient will benefit from skilled therapeutic intervention in order to improve the following deficits and impairments:  Abnormal gait, Decreased activity tolerance, Decreased balance, Decreased endurance, Decreased mobility, Decreased range of motion, Decreased strength, Difficulty walking, Hypomobility, Increased edema, Increased muscle spasms, Increased fascial restricitons, Impaired flexibility, Pain  Visit Diagnosis: Acute pain of right  knee  Stiffness of left knee  Localized edema  Muscle weakness (generalized)  Difficulty in walking, not elsewhere classified     Problem List Patient Active Problem List   Diagnosis Date Noted  . Hypercholesteremia 10/09/2016  . Immunization refused 09/11/2016  . Intermittent asthma without complication 09/11/2016  . Well woman exam with routine gynecological exam 01/13/2016  . Contraceptive management 03/31/2014  . Class 2 obesity due to excess calories without serious comorbidity with body mass index (BMI) of 36.0 to 36.9 in adult 10/14/2013  . Migraines   . HTN (hypertension)   . Multiple allergies    Lurena Nida, PTA/CLT [id number]  Lurena Nida 02/04/2017, 11:51 AM  [place] [facility] [address] [place], [place], [address] Phone: [phone]   Fax:  [phone]  Name: Deanna Friedman MRN: [id number] Date of Birth: 08-29-1975

## 2017-02-06 ENCOUNTER — Ambulatory Visit (HOSPITAL_COMMUNITY): Payer: BC Managed Care – PPO

## 2017-02-06 ENCOUNTER — Encounter (HOSPITAL_COMMUNITY): Payer: Self-pay

## 2017-02-06 DIAGNOSIS — M25662 Stiffness of left knee, not elsewhere classified: Secondary | ICD-10-CM

## 2017-02-06 DIAGNOSIS — M25561 Pain in right knee: Secondary | ICD-10-CM

## 2017-02-06 DIAGNOSIS — R262 Difficulty in walking, not elsewhere classified: Secondary | ICD-10-CM

## 2017-02-06 DIAGNOSIS — M6281 Muscle weakness (generalized): Secondary | ICD-10-CM

## 2017-02-06 DIAGNOSIS — R600 Localized edema: Secondary | ICD-10-CM

## 2017-02-06 NOTE — Therapy (Signed)
[facility] [address] [place], [place], [address] Phone: [phone]   Fax:  [phone]  Physical Therapy Treatment  Patient Details  Name: Deanna Friedman MRN: [id number] Date of Birth: [birth year] Referring Provider: Arlan Organ, MD  Encounter Date: 02/06/2017      PT End of Session - 02/06/17 1034    Visit Number 3   Number of Visits 19   Date for PT Re-Evaluation 02/22/17   Authorization Type [employer]   Authorization Time Period 02/01/17 to 03/15/17   PT Start Time 1033   PT Stop Time 1115   PT Time Calculation (min) 42 min   Equipment Utilized During Treatment Left knee immobilizer;Other (comment)  bil axillary crutches   Activity Tolerance Patient tolerated treatment well;No increased pain   Behavior During Therapy WFL for tasks assessed/performed      Past Medical History:  Diagnosis Date  . ADHD (attention deficit hyperactivity disorder)   . Allergy    food, meds, mold, dust  . Anemia   . Asthma   . Blood transfusion without reported diagnosis    childbirth  . Body aches 11/29/2014  . BV (bacterial vaginosis) 10/14/2013  . Contraceptive management 03/31/2014  . Decreased sex drive 1/32/4401  . Folliculitis 01/27/2015  . Heart murmur   . HTN (hypertension)   . Hypercholesteremia 10/09/2016  . Migraines   . Multiple allergies   . Obesity 10/14/2013  . Vaginal itching 12/30/2014    Past Surgical History:  Procedure Laterality Date  . BREAST LUMPECTOMY Right    fibroadenoma  . DILATION AND CURETTAGE OF UTERUS    . KNEE ARTHROSCOPY      There were no vitals filed for this visit.      Subjective Assessment - 02/06/17 1034    Subjective Pt states that her pain at the front of her knee is a lot better. She feels her flexibility and walking are also better. Her swelling makes her HEP a little difficult   Currently in Pain? Yes   Pain Score 3    Pain Location Knee   Pain Orientation Left   Pain  Descriptors / Indicators Sharp;Throbbing   Pain Type Surgical pain   Pain Onset 1 to 4 weeks ago   Pain Frequency Intermittent   Aggravating Factors  being up on it for a long time, brace being too tight   Pain Relieving Factors ice, rest, elevation, meds   Effect of Pain on Daily Activities increases              [facility] Adult PT Treatment/Exercise - 02/06/17 0001      Knee/Hip Exercises: Seated   Long Arc Quad Left;15 reps   Long Arc Quad Limitations 90 to 45 deg     Knee/Hip Exercises: Supine   Quad Sets Left;15 reps   Heel Slides Left;15 reps   Straight Leg Raises AAROM;Right;10 reps   Straight Leg Raises Limitations with brace on   Knee Extension Limitations -9   Knee Flexion Limitations 71     Knee/Hip Exercises: Sidelying   Hip ABduction Left;2 sets;10 reps   Hip ABduction Limitations with brace on   Hip ADduction Left;10 reps   Hip ADduction Limitations with brace on     Knee/Hip Exercises: Prone   Hip Extension Left;2 sets;10 reps   Hip Extension Limitations with brace on     Manual Therapy   Manual Therapy Edema management   Manual therapy comments completed separate rest  of treatment   Edema Management retro massage to decrease edema                PT Short Term Goals - 02/01/17 1229      PT SHORT TERM GOAL #1   Title Pt will be independent with HEP and perform consistently in order to maximize return to PLOF.   Time 3   Period Weeks   Target Date 02/22/17     PT SHORT TERM GOAL #2   Title Pt will have improved L knee AROM to 5-100 deg to decrease pain and maximize overall function.   Time 3   Period Weeks   Status New     PT SHORT TERM GOAL #3   Title Pt will have improved MMT of hip musculature to 5/5 to maximize gait.    Time 3   Period Weeks   Status New     PT SHORT TERM GOAL #4   Title Pt will have decreased swelling to at least 15" on the L at joint line to decrease pain and maximize ROM.   Time 3   Period Weeks   Status  New           PT Long Term Goals - 02/01/17 1232      PT LONG TERM GOAL #1   Title Pt will have improved L knee AROM to at least 0-120 deg to demo improved overall function and maximize sitting tolerance, gait, and stair ambulation   Time 6   Period Weeks   Status New   Target Date 03/15/17     PT LONG TERM GOAL #2   Title Pt will have at least 4/5 L knee MMT to maximize gait and return to PLOF.   Time 6   Period Weeks   Status New     PT LONG TERM GOAL #3   Title Pt will be able to perform SLS for at least 10 sec on LLE to demo improved function and maximize gait.    Baseline (once she is able to fully bear weight through LLE)   Time 6   Period Weeks   Status New     PT LONG TERM GOAL #4   Title Other long term goals will be updated once pt's WB precautions are lifted and pt is able to be out of her immobilizer and further objective testing can be performed. Jac Canavan, PT, DPT               Plan - 02/06/17 1115    Clinical Impression Statement Pt with improved symptoms this date and did not present with as much swelling as last session. Added 4-way SLR with brace on this date; pt required AAROM for supine SLR due to weakness but otherwise tolerated well. Also added LAQs from 90 deg flexion to 45 deg flex without c/o pain. Pt's AROM continues to improve nicely as she was -9 to 71 deg this date.    Rehab Potential Good   PT Frequency 3x / week   PT Duration 6 weeks   PT Treatment/Interventions ADLs/Self Care Home Management;Cryotherapy;Electrical Stimulation;Moist Heat;Ultrasound;DME Instruction;Gait training;Stair training;Functional mobility training;Therapeutic activities;Therapeutic exercise;Balance training;Neuromuscular re-education;Patient/family education;Manual techniques;Wheelchair mobility training;Passive range of motion;Scar mobilization;Dry needling;Energy conservation;Taping   PT Next Visit Plan Follow [facility] Orthopedic ACL Allograft Reconstruction  Protocol; (further objective testing should be performed once pt is FWB on LLE). NWB ROM exercises, can do e-stim for improved quad contraction if needed.  follow up with affects of  hivolt for swelling.  give compression order to patient when received.     PT Home Exercise Plan eval: supine calf stretch with rope, heel slides, quad sets; 9/19: 4-way SLR with brace   Consulted and Agree with Plan of Care Patient      Patient will benefit from skilled therapeutic intervention in order to improve the following deficits and impairments:  Abnormal gait, Decreased activity tolerance, Decreased balance, Decreased endurance, Decreased mobility, Decreased range of motion, Decreased strength, Difficulty walking, Hypomobility, Increased edema, Increased muscle spasms, Increased fascial restricitons, Impaired flexibility, Pain  Visit Diagnosis: Acute pain of right knee  Stiffness of left knee  Localized edema  Muscle weakness (generalized)  Difficulty in walking, not elsewhere classified     Problem List Patient Active Problem List   Diagnosis Date Noted  . Hypercholesteremia 10/09/2016  . Immunization refused 09/11/2016  . Intermittent asthma without complication 09/11/2016  . Well woman exam with routine gynecological exam 01/13/2016  . Contraceptive management 03/31/2014  . Class 2 obesity due to excess calories without serious comorbidity with body mass index (BMI) of 36.0 to 36.9 in adult 10/14/2013  . Migraines   . HTN (hypertension)   . Multiple allergies       Jac Canavan PT, DPT  [facility] [address] [place], [place], [address] Phone: [phone]   Fax:  [phone]  Name: Deanna Friedman MRN: [id number] Date of Birth: 1975-11-19

## 2017-02-08 ENCOUNTER — Encounter (HOSPITAL_COMMUNITY): Payer: Self-pay

## 2017-02-08 ENCOUNTER — Ambulatory Visit (HOSPITAL_COMMUNITY): Payer: BC Managed Care – PPO

## 2017-02-08 DIAGNOSIS — M25561 Pain in right knee: Secondary | ICD-10-CM | POA: Diagnosis not present

## 2017-02-08 DIAGNOSIS — R600 Localized edema: Secondary | ICD-10-CM

## 2017-02-08 DIAGNOSIS — M25662 Stiffness of left knee, not elsewhere classified: Secondary | ICD-10-CM

## 2017-02-08 DIAGNOSIS — R262 Difficulty in walking, not elsewhere classified: Secondary | ICD-10-CM

## 2017-02-08 DIAGNOSIS — M6281 Muscle weakness (generalized): Secondary | ICD-10-CM

## 2017-02-08 NOTE — Therapy (Signed)
[facility] [address] [place], [place], [address] Phone: [phone]   Fax:  [phone]  Physical Therapy Treatment  Patient Details  Name: Deanna Friedman MRN: [id number] Date of Birth: [birth year] Referring Provider: Arlan Organ, MD  Encounter Date: 02/08/2017      PT End of Session - 02/08/17 0942    Visit Number 4   Number of Visits 19   Date for PT Re-Evaluation 02/22/17   Authorization Type [employer]   Authorization Time Period 02/01/17 to 03/15/17   PT Start Time 0901   PT Stop Time 0944   PT Time Calculation (min) 43 min   Equipment Utilized During Treatment Left knee immobilizer;Other (comment)   Activity Tolerance Patient tolerated treatment well;No increased pain   Behavior During Therapy WFL for tasks assessed/performed      Past Medical History:  Diagnosis Date  . ADHD (attention deficit hyperactivity disorder)   . Allergy    food, meds, mold, dust  . Anemia   . Asthma   . Blood transfusion without reported diagnosis    childbirth  . Body aches 11/29/2014  . BV (bacterial vaginosis) 10/14/2013  . Contraceptive management 03/31/2014  . Decreased sex drive 6/96/2952  . Folliculitis 01/27/2015  . Heart murmur   . HTN (hypertension)   . Hypercholesteremia 10/09/2016  . Migraines   . Multiple allergies   . Obesity 10/14/2013  . Vaginal itching 12/30/2014    Past Surgical History:  Procedure Laterality Date  . BREAST LUMPECTOMY Right    fibroadenoma  . DILATION AND CURETTAGE OF UTERUS    . KNEE ARTHROSCOPY      There were no vitals filed for this visit.      Subjective Assessment - 02/08/17 0903    Subjective Patient states she is feeling fine except for the sporatic pain she feels on occassion. She notes the whole leg and brace felt uncomfortable last night, dull ache description. Current pain is about a 3/10. Pulling feeling always the front of Lt. knee "right under knee cap"   Currently in  Pain? Yes   Pain Location Knee   Pain Orientation Right   Pain Descriptors / Indicators Constant;Discomfort;[facility] Adult PT Treatment/Exercise - 02/08/17 0001      Knee/Hip Exercises: Standing   Gait Training weight shift x 10     Knee/Hip Exercises: Supine   Quad Sets Left;15 reps   Heel Slides 10 reps;2 sets;Left   Straight Leg Raises Left;AAROM;10 reps   Straight Leg Raises Limitations with brace on   Knee Extension Limitations -10   Knee Flexion Limitations 85     Knee/Hip Exercises: Sidelying   Hip ABduction Left;2 sets;10 reps   Hip ABduction Limitations with brace on   Hip ADduction Left;10 reps   Hip ADduction Limitations with brace on     Knee/Hip Exercises: Prone   Hip Extension 2 sets;10 reps;Left   Hip Extension Limitations with brace on     Manual Therapy   Manual Therapy Joint mobilization;Edema management   Manual therapy comments completed separate rest of treatment   Edema Management massage to decrease edema   Joint Mobilization PROM knee flexion in supine without brace no >90; patellar mobility Gr. I for pain  PT Short Term Goals - 02/01/17 1229      PT SHORT TERM GOAL #1   Title Pt will be independent with HEP and perform consistently in order to maximize return to PLOF.   Time 3   Period Weeks   Target Date 02/22/17     PT SHORT TERM GOAL #2   Title Pt will have improved L knee AROM to 5-100 deg to decrease pain and maximize overall function.   Time 3   Period Weeks   Status New     PT SHORT TERM GOAL #3   Title Pt will have improved MMT of hip musculature to 5/5 to maximize gait.    Time 3   Period Weeks   Status New     PT SHORT TERM GOAL #4   Title Pt will have decreased swelling to at least 15" on the L at joint line to decrease pain and maximize ROM.   Time 3   Period Weeks   Status New           PT Long Term Goals - 02/01/17 1232      PT LONG TERM  GOAL #1   Title Pt will have improved L knee AROM to at least 0-120 deg to demo improved overall function and maximize sitting tolerance, gait, and stair ambulation   Time 6   Period Weeks   Status New   Target Date 03/15/17     PT LONG TERM GOAL #2   Title Pt will have at least 4/5 L knee MMT to maximize gait and return to PLOF.   Time 6   Period Weeks   Status New     PT LONG TERM GOAL #3   Title Pt will be able to perform SLS for at least 10 sec on LLE to demo improved function and maximize gait.    Baseline (once she is able to fully bear weight through LLE)   Time 6   Period Weeks   Status New     PT LONG TERM GOAL #4   Title Other long term goals will be updated once pt's WB precautions are lifted and pt is able to be out of her immobilizer and further objective testing can be performed. Jac Canavan, PT, DPT               Plan - 02/08/17 7829    Clinical Impression Statement Patient tolerated session well today with good ROM without increased irritation except at end-range. She has visual quad contraction with muscle fatigue noted with repetition. Pt had good weight shift over onto Lt. leg at support surface without verbal increased pain. She continues to report fluctation in swelling but overall doing well. She notes the pull over the patellar T. area where surgical incision are inferior Lt. knee.    Rehab Potential Good   PT Frequency 3x / week   PT Duration 6 weeks   PT Treatment/Interventions ADLs/Self Care Home Management;Cryotherapy;Electrical Stimulation;Moist Heat;Ultrasound;DME Instruction;Gait training;Stair training;Functional mobility training;Therapeutic activities;Therapeutic exercise;Balance training;Neuromuscular re-education;Patient/family education;Manual techniques;Wheelchair mobility training;Passive range of motion;Scar mobilization;Dry needling;Energy conservation;Taping   PT Next Visit Plan Follow [facility] Orthopedic ACL Allograft Reconstruction  Protocol; (further objective testing should be performed once pt is FWB on LLE). NWB ROM exercises, can do e-stim for improved quad contraction if needed.  follow up with affects of hivolt for swelling.  give compression order to patient when received.     PT Home Exercise Plan eval: supine calf stretch with rope,  heel slides, quad sets; 9/19: 4-way SLR with brace   Consulted and Agree with Plan of Care Patient      Patient will benefit from skilled therapeutic intervention in order to improve the following deficits and impairments:  Abnormal gait, Decreased activity tolerance, Decreased balance, Decreased endurance, Decreased mobility, Decreased range of motion, Decreased strength, Difficulty walking, Hypomobility, Increased edema, Increased muscle spasms, Increased fascial restricitons, Impaired flexibility, Pain  Visit Diagnosis: Acute pain of right knee  Stiffness of left knee  Localized edema  Muscle weakness (generalized)  Difficulty in walking, not elsewhere classified     Problem List Patient Active Problem List   Diagnosis Date Noted  . Hypercholesteremia 10/09/2016  . Immunization refused 09/11/2016  . Intermittent asthma without complication 09/11/2016  . Well woman exam with routine gynecological exam 01/13/2016  . Contraceptive management 03/31/2014  . Class 2 obesity due to excess calories without serious comorbidity with body mass index (BMI) of 36.0 to 36.9 in adult 10/14/2013  . Migraines   . HTN (hypertension)   . Multiple allergies     Candise Che PT, DPT 9:50 AM, 02/08/17 [phone]   [facility] [address] [place], [place], [address] Phone: [phone]   Fax:  [phone]  Name: Deanna Friedman MRN: [id number] Date of Birth: [birth year]

## 2017-02-11 ENCOUNTER — Ambulatory Visit (HOSPITAL_COMMUNITY): Payer: BC Managed Care – PPO

## 2017-02-11 DIAGNOSIS — M25662 Stiffness of left knee, not elsewhere classified: Secondary | ICD-10-CM

## 2017-02-11 DIAGNOSIS — R262 Difficulty in walking, not elsewhere classified: Secondary | ICD-10-CM

## 2017-02-11 DIAGNOSIS — M25561 Pain in right knee: Secondary | ICD-10-CM | POA: Diagnosis not present

## 2017-02-11 DIAGNOSIS — M6281 Muscle weakness (generalized): Secondary | ICD-10-CM

## 2017-02-11 DIAGNOSIS — R600 Localized edema: Secondary | ICD-10-CM

## 2017-02-11 NOTE — Therapy (Signed)
[facility] [address] [place], [place], [address] Phone: [phone]   Fax:  [phone]  Physical Therapy Treatment  Patient Details  Name: Deanna Friedman MRN: [id number] Date of Birth: [birth year] Referring Provider: Arlan Organ, MD  Encounter Date: 02/11/2017      PT End of Session - 02/11/17 1514    Visit Number 5   Number of Visits 19   Date for PT Re-Evaluation 02/22/17   Authorization Type [employer]   Authorization Time Period 02/01/17 to 03/15/17   PT Start Time 1431   PT Stop Time 1515   PT Time Calculation (min) 44 min   Equipment Utilized During Treatment Left knee immobilizer;Other (comment)   Activity Tolerance Patient tolerated treatment well;No increased pain   Behavior During Therapy WFL for tasks assessed/performed      Past Medical History:  Diagnosis Date  . ADHD (attention deficit hyperactivity disorder)   . Allergy    food, meds, mold, dust  . Anemia   . Asthma   . Blood transfusion without reported diagnosis    childbirth  . Body aches 11/29/2014  . BV (bacterial vaginosis) 10/14/2013  . Contraceptive management 03/31/2014  . Decreased sex drive 1/66/0630  . Folliculitis 01/27/2015  . Heart murmur   . HTN (hypertension)   . Hypercholesteremia 10/09/2016  . Migraines   . Multiple allergies   . Obesity 10/14/2013  . Vaginal itching 12/30/2014    Past Surgical History:  Procedure Laterality Date  . BREAST LUMPECTOMY Right    fibroadenoma  . DILATION AND CURETTAGE OF UTERUS    . KNEE ARTHROSCOPY      There were no vitals filed for this visit.      Subjective Assessment - 02/11/17 1434    Subjective Pt reports she saw the surgeon this morning, who tried to drain the fluid off knee, reports she no l;onger needs to use ted hose. Pt reports no change to weight bearing status. No longer on pain meds.    Currently in Pain? Yes   Pain Score 4    Pain Location --  generalized achiness in  the anterior knee    Pain Orientation Left                         [facility] Adult PT Treatment/Exercise - 02/11/17 0001      Knee/Hip Exercises: Standing   Other Standing Knee Exercises Hip extension 1x10  Hip abduction 1x10     Knee/Hip Exercises: Supine   Quad Sets Both;10 reps;1 set  10x3sec   Quad Sets Limitations 10x3secH   Heel Slides 1 set;Left  With self assist gaitbelt, education on avoiding valgus    Straight Leg Raises Left;AAROM;10 reps;3 sets   Straight Leg Raises Limitations leg supports   Knee Extension Limitations 9   Knee Flexion Limitations 84   Other Supine Knee/Hip Exercises Left Abdct/add heel slides: 1x15     Modalities   Modalities Electrical Stimulation     Electrical Stimulation   Electrical Stimulation Location Lt knee (medial and lateral)  x pattern over patella 8-9" wide    Electrical Stimulation Action Sensory level TENS    Electrical Stimulation Parameters modulating current     Manual Therapy   Manual Therapy Passive ROM   Joint Mobilization PROM knee flexion in supine without brace no >90; patellar mobility Gr. I for pain  3x60sec  PT Short Term Goals - 02/01/17 1229      PT SHORT TERM GOAL #1   Title Pt will be independent with HEP and perform consistently in order to maximize return to PLOF.   Time 3   Period Weeks   Target Date 02/22/17     PT SHORT TERM GOAL #2   Title Pt will have improved L knee AROM to 5-100 deg to decrease pain and maximize overall function.   Time 3   Period Weeks   Status New     PT SHORT TERM GOAL #3   Title Pt will have improved MMT of hip musculature to 5/5 to maximize gait.    Time 3   Period Weeks   Status New     PT SHORT TERM GOAL #4   Title Pt will have decreased swelling to at least 15" on the L at joint line to decrease pain and maximize ROM.   Time 3   Period Weeks   Status New           PT Long Term Goals - 02/01/17 1232      PT LONG  TERM GOAL #1   Title Pt will have improved L knee AROM to at least 0-120 deg to demo improved overall function and maximize sitting tolerance, gait, and stair ambulation   Time 6   Period Weeks   Status New   Target Date 03/15/17     PT LONG TERM GOAL #2   Title Pt will have at least 4/5 L knee MMT to maximize gait and return to PLOF.   Time 6   Period Weeks   Status New     PT LONG TERM GOAL #3   Title Pt will be able to perform SLS for at least 10 sec on LLE to demo improved function and maximize gait.    Baseline (once she is able to fully bear weight through LLE)   Time 6   Period Weeks   Status New     PT LONG TERM GOAL #4   Title Other long term goals will be updated once pt's WB precautions are lifted and pt is able to be out of her immobilizer and further objective testing can be performed. Jac Canavan, PT, DPT               Plan - 02/11/17 1516    Clinical Impression Statement Good response overall. continuing with protocol, estim, sttretching, P/ROM, Pt tolerating well, no pain increase. Progress toward goals is coming along nicely.    Rehab Potential Good   PT Frequency 3x / week   PT Duration 6 weeks   PT Next Visit Plan Follow [facility] Orthopedic ACL Allograft Reconstruction Protocol; (further objective testing should be performed once pt is FWB on LLE). WBAT, sensory TENS concurrent with therex.  Holding compression order at this time as edema is improved.    PT Home Exercise Plan eval: supine calf stretch with rope, heel slides, quad sets; 9/19: 4-way SLR with brace   Consulted and Agree with Plan of Care Patient      Patient will benefit from skilled therapeutic intervention in order to improve the following deficits and impairments:  Abnormal gait, Decreased activity tolerance, Decreased balance, Decreased endurance, Decreased mobility, Decreased range of motion, Decreased strength, Difficulty walking, Hypomobility, Increased edema, Increased muscle  spasms, Increased fascial restricitons, Impaired flexibility, Pain  Visit Diagnosis: Acute pain of right knee  Stiffness of left knee  Localized edema  Muscle weakness (generalized)  Difficulty in walking, not elsewhere classified     Problem List Patient Active Problem List   Diagnosis Date Noted  . Hypercholesteremia 10/09/2016  . Immunization refused 09/11/2016  . Intermittent asthma without complication 09/11/2016  . Well woman exam with routine gynecological exam 01/13/2016  . Contraceptive management 03/31/2014  . Class 2 obesity due to excess calories without serious comorbidity with body mass index (BMI) of 36.0 to 36.9 in adult 10/14/2013  . Migraines   . HTN (hypertension)   . Multiple allergies     Adara Kittle C 02/11/2017, 3:19 PM   3:19 PM, 02/11/17 Rosamaria Lints, PT, DPT Physical Therapist - [place] [phone] (Office)    [facility] [address] [place], [place], [address] Phone: [phone]   Fax:  [phone]  Name: EMELINE SIMPSON MRN: [id number] Date of Birth: 04-21-1976

## 2017-02-13 ENCOUNTER — Encounter (HOSPITAL_COMMUNITY): Payer: Self-pay

## 2017-02-13 ENCOUNTER — Ambulatory Visit (HOSPITAL_COMMUNITY): Payer: BC Managed Care – PPO

## 2017-02-13 DIAGNOSIS — M25561 Pain in right knee: Secondary | ICD-10-CM

## 2017-02-13 DIAGNOSIS — R262 Difficulty in walking, not elsewhere classified: Secondary | ICD-10-CM

## 2017-02-13 DIAGNOSIS — R600 Localized edema: Secondary | ICD-10-CM

## 2017-02-13 DIAGNOSIS — M25662 Stiffness of left knee, not elsewhere classified: Secondary | ICD-10-CM

## 2017-02-13 DIAGNOSIS — M6281 Muscle weakness (generalized): Secondary | ICD-10-CM

## 2017-02-13 NOTE — Therapy (Signed)
[facility] [address] [place], [place], [address] Phone: [phone]   Fax:  [phone]  Physical Therapy Treatment  Patient Details  Name: Deanna Friedman MRN: [id number] Date of Birth: [birth year] Referring Provider: Arlan Organ, MD  Encounter Date: 02/13/2017      PT End of Session - 02/13/17 1628    Visit Number 6   Number of Visits 19   Date for PT Re-Evaluation 02/22/17   Authorization Type [employer]   Authorization Time Period 02/01/17 to 03/15/17   PT Start Time 1605   PT Stop Time 1648   PT Time Calculation (min) 43 min   Equipment Utilized During Treatment Left knee immobilizer;Other (comment)  brace and ace bandage around knee   Activity Tolerance Patient tolerated treatment well;No increased pain   Behavior During Therapy WFL for tasks assessed/performed      Past Medical History:  Diagnosis Date  . ADHD (attention deficit hyperactivity disorder)   . Allergy    food, meds, mold, dust  . Anemia   . Asthma   . Blood transfusion without reported diagnosis    childbirth  . Body aches 11/29/2014  . BV (bacterial vaginosis) 10/14/2013  . Contraceptive management 03/31/2014  . Decreased sex drive 1/32/4401  . Folliculitis 01/27/2015  . Heart murmur   . HTN (hypertension)   . Hypercholesteremia 10/09/2016  . Migraines   . Multiple allergies   . Obesity 10/14/2013  . Vaginal itching 12/30/2014    Past Surgical History:  Procedure Laterality Date  . BREAST LUMPECTOMY Right    fibroadenoma  . DILATION AND CURETTAGE OF UTERUS    . KNEE ARTHROSCOPY      There were no vitals filed for this visit.      Subjective Assessment - 02/13/17 1609    Subjective Pt reports decrease in swelling and overall pain.  Stated brace slid down LE and had to adjust to Current pain scale 2/10 mainly discomfort.  Reports compliance with HEP and ice application.  Returned to work yesterday, has been propping up during work  and gets tired walking across campus.   Patient Stated Goals get back mobility to where I was before   Currently in Pain? Yes   Pain Score 2    Pain Location Knee   Pain Orientation Left   Pain Descriptors / Indicators Discomfort   Pain Type Surgical pain   Pain Onset 1 to 4 weeks ago   Pain Frequency Intermittent   Aggravating Factors  being up on it for a long time, brace being too tight   Pain Relieving Factors ice, rest, elevation, meds   Effect of Pain on Daily Activities increases                         [facility] Adult PT Treatment/Exercise - 02/13/17 0001      Knee/Hip Exercises: Standing   Gait Training Gait training with 1 crutch to improve mechanics x 200 ft     Knee/Hip Exercises: Supine   Quad Sets Both;1 set;15 reps   Quad Sets Limitations 5" holds; tactile cueing to improve quad activation   Short Arc Quad Sets Left;10 reps   Heel Slides 15 reps;Left   Straight Leg Raises AAROM;Left;2 sets;15 reps   Straight Leg Raises Limitations leg supports   Knee Extension Limitations 6   Knee Flexion Limitations 84     Manual Therapy   Manual Therapy Edema management;Joint mobilization;Passive  ROM   Manual therapy comments completed separate rest of treatment   Edema Management retrograde massage with LE elevated with ankle pumps    Joint Mobilization patella mobs all directions   Passive ROM PROM knee flexion no > than 90                  PT Short Term Goals - 02/01/17 1229      PT SHORT TERM GOAL #1   Title Pt will be independent with HEP and perform consistently in order to maximize return to PLOF.   Time 3   Period Weeks   Target Date 02/22/17     PT SHORT TERM GOAL #2   Title Pt will have improved L knee AROM to 5-100 deg to decrease pain and maximize overall function.   Time 3   Period Weeks   Status New     PT SHORT TERM GOAL #3   Title Pt will have improved MMT of hip musculature to 5/5 to maximize gait.    Time 3   Period  Weeks   Status New     PT SHORT TERM GOAL #4   Title Pt will have decreased swelling to at least 15" on the L at joint line to decrease pain and maximize ROM.   Time 3   Period Weeks   Status New           PT Long Term Goals - 02/01/17 1232      PT LONG TERM GOAL #1   Title Pt will have improved L knee AROM to at least 0-120 deg to demo improved overall function and maximize sitting tolerance, gait, and stair ambulation   Time 6   Period Weeks   Status New   Target Date 03/15/17     PT LONG TERM GOAL #2   Title Pt will have at least 4/5 L knee MMT to maximize gait and return to PLOF.   Time 6   Period Weeks   Status New     PT LONG TERM GOAL #3   Title Pt will be able to perform SLS for at least 10 sec on LLE to demo improved function and maximize gait.    Baseline (once she is able to fully bear weight through LLE)   Time 6   Period Weeks   Status New     PT LONG TERM GOAL #4   Title Other long term goals will be updated once pt's WB precautions are lifted and pt is able to be out of her immobilizer and further objective testing can be performed. Jac Canavan, PT, DPT               Plan - 02/13/17 1653    Clinical Impression Statement Pt at 2 week post-op.  Continued session focus per ACL protocol.  Pt progressing well with decreased edema proximal knee, did present with increased swelling calf musculature.  Began session wiht retro massage to assist with edema control prior therex.  Pt improving quadricep control with AROM 6-84 degrees.  Reviewed gait mechanics with 1 crutch with min cuieng to improve sequence and posture during gait.     Rehab Potential Good   PT Frequency 3x / week   PT Duration 6 weeks   PT Treatment/Interventions ADLs/Self Care Home Management;Cryotherapy;Electrical Stimulation;Moist Heat;Ultrasound;DME Instruction;Gait training;Stair training;Functional mobility training;Therapeutic activities;Therapeutic exercise;Balance  training;Neuromuscular re-education;Patient/family education;Manual techniques;Wheelchair mobility training;Passive range of motion;Scar mobilization;Dry needling;Energy conservation;Taping   PT Next Visit Plan Follow [facility]  Orthopedic ACL Allograft Reconstruction Protocol; (further objective testing should be performed once pt is FWB on LLE). WBAT, sensory TENS concurrent with therex.  Holding compression order at this time as edema is improved.    PT Home Exercise Plan eval: supine calf stretch with rope, heel slides, quad sets; 9/19: 4-way SLR with brace      Patient will benefit from skilled therapeutic intervention in order to improve the following deficits and impairments:  Abnormal gait, Decreased activity tolerance, Decreased balance, Decreased endurance, Decreased mobility, Decreased range of motion, Decreased strength, Difficulty walking, Hypomobility, Increased edema, Increased muscle spasms, Increased fascial restricitons, Impaired flexibility, Pain  Visit Diagnosis: Acute pain of right knee  Stiffness of left knee  Localized edema  Muscle weakness (generalized)  Difficulty in walking, not elsewhere classified     Problem List Patient Active Problem List   Diagnosis Date Noted  . Hypercholesteremia 10/09/2016  . Immunization refused 09/11/2016  . Intermittent asthma without complication 09/11/2016  . Well woman exam with routine gynecological exam 01/13/2016  . Contraceptive management 03/31/2014  . Class 2 obesity due to excess calories without serious comorbidity with body mass index (BMI) of 36.0 to 36.9 in adult 10/14/2013  . Migraines   . HTN (hypertension)   . Multiple allergies    Becky Sax, [place]; CBIS [phone]  Juel Burrow 02/13/2017, 5:25 PM  [facility] [address] [place], [place], [address] Phone: [phone]   Fax:  [phone]  Name: Deanna Friedman MRN: [id number] Date of Birth:  01/09/1976

## 2017-02-15 ENCOUNTER — Ambulatory Visit (INDEPENDENT_AMBULATORY_CARE_PROVIDER_SITE_OTHER): Payer: BC Managed Care – PPO | Admitting: *Deleted

## 2017-02-15 ENCOUNTER — Ambulatory Visit (HOSPITAL_COMMUNITY): Payer: BC Managed Care – PPO

## 2017-02-15 ENCOUNTER — Encounter: Payer: Self-pay | Admitting: *Deleted

## 2017-02-15 ENCOUNTER — Encounter (HOSPITAL_COMMUNITY): Payer: Self-pay

## 2017-02-15 DIAGNOSIS — R600 Localized edema: Secondary | ICD-10-CM

## 2017-02-15 DIAGNOSIS — M25561 Pain in right knee: Secondary | ICD-10-CM | POA: Diagnosis not present

## 2017-02-15 DIAGNOSIS — M25662 Stiffness of left knee, not elsewhere classified: Secondary | ICD-10-CM

## 2017-02-15 DIAGNOSIS — Z3202 Encounter for pregnancy test, result negative: Secondary | ICD-10-CM

## 2017-02-15 DIAGNOSIS — R262 Difficulty in walking, not elsewhere classified: Secondary | ICD-10-CM

## 2017-02-15 DIAGNOSIS — Z3042 Encounter for surveillance of injectable contraceptive: Secondary | ICD-10-CM | POA: Diagnosis not present

## 2017-02-15 DIAGNOSIS — M6281 Muscle weakness (generalized): Secondary | ICD-10-CM

## 2017-02-15 DIAGNOSIS — Z308 Encounter for other contraceptive management: Secondary | ICD-10-CM

## 2017-02-15 LAB — POCT URINE PREGNANCY: Preg Test, Ur: NEGATIVE

## 2017-02-15 MED ORDER — MEDROXYPROGESTERONE ACETATE 150 MG/ML IM SUSP
150.0000 mg | Freq: Once | INTRAMUSCULAR | Status: AC
  Administered 2017-02-15: 150 mg via INTRAMUSCULAR

## 2017-02-15 NOTE — Therapy (Signed)
[facility] [address] [place], [place], [address] Phone: [phone]   Fax:  [phone]  Physical Therapy Treatment  Patient Details  Name: Deanna Friedman MRN: [id number] Date of Birth: 05-20-1976 Referring Provider: Arlan Organ, MD  Encounter Date: 02/15/2017      PT End of Session - 02/15/17 0902    Visit Number 7   Number of Visits 19   Date for PT Re-Evaluation 02/22/17   Authorization Type [employer]   Authorization Time Period 02/01/17 to 03/15/17   PT Start Time 0900   PT Stop Time 0945   PT Time Calculation (min) 45 min   Equipment Utilized During Treatment Left knee immobilizer;Other (comment)  brace and ace bandage around knee   Activity Tolerance Patient tolerated treatment well;No increased pain   Behavior During Therapy WFL for tasks assessed/performed      Past Medical History:  Diagnosis Date  . ADHD (attention deficit hyperactivity disorder)   . Allergy    food, meds, mold, dust  . Anemia   . Asthma   . Blood transfusion without reported diagnosis    childbirth  . Body aches 11/29/2014  . BV (bacterial vaginosis) 10/14/2013  . Contraceptive management 03/31/2014  . Decreased sex drive 6/96/2952  . Folliculitis 01/27/2015  . Heart murmur   . HTN (hypertension)   . Hypercholesteremia 10/09/2016  . Migraines   . Multiple allergies   . Obesity 10/14/2013  . Vaginal itching 12/30/2014    Past Surgical History:  Procedure Laterality Date  . BREAST LUMPECTOMY Right    fibroadenoma  . DILATION AND CURETTAGE OF UTERUS    . KNEE ARTHROSCOPY      There were no vitals filed for this visit.      Subjective Assessment - 02/15/17 0902    Subjective Pt states that she has been able to walk without crutches at home with min to no pain. She states that her ankle has still been swelling some though. Her sleeping is still difficult due to having to wear the brace.    Patient Stated Goals get back  mobility to where I was before   Currently in Pain? No/denies   Pain Onset 1 to 4 weeks ago                [facility] Adult PT Treatment/Exercise - 02/15/17 0001      Knee/Hip Exercises: Standing   Knee Flexion Left;15 reps   Functional Squat 15 reps   Functional Squat Limitations very mini squats (0 to about 20 deg, but per protocol can go to 40 deg)   Gait Training gait training x1 lap without crutch   Other Standing Knee Exercises weight shifts in standing x 4 mins total (no brace)     Knee/Hip Exercises: Seated   Long Arc Quad Left;15 reps   Long Arc Quad Limitations 90 to 45 deg     Knee/Hip Exercises: Supine   Quad Sets Left;15 reps   Quad Sets Limitations 5" holds   Knee Extension Limitations 4   Knee Flexion Limitations 87     Manual Therapy   Manual Therapy Edema management   Manual therapy comments completed separate rest of treatment   Edema Management retrograde massage with LE elevated with ankle pumps                PT Education - 02/15/17 0904    Education provided Yes   Education Details continue POC, can begin  to amb without crutch a little more at home, still use crutch in crowds   Person(s) Educated Patient   Methods Explanation;Demonstration   Comprehension Verbalized understanding;Returned demonstration          PT Short Term Goals - 02/01/17 1229      PT SHORT TERM GOAL #1   Title Pt will be independent with HEP and perform consistently in order to maximize return to PLOF.   Time 3   Period Weeks   Target Date 02/22/17     PT SHORT TERM GOAL #2   Title Pt will have improved L knee AROM to 5-100 deg to decrease pain and maximize overall function.   Time 3   Period Weeks   Status New     PT SHORT TERM GOAL #3   Title Pt will have improved MMT of hip musculature to 5/5 to maximize gait.    Time 3   Period Weeks   Status New     PT SHORT TERM GOAL #4   Title Pt will have decreased swelling to at least 15" on the L at joint  line to decrease pain and maximize ROM.   Time 3   Period Weeks   Status New           PT Long Term Goals - 02/01/17 1232      PT LONG TERM GOAL #1   Title Pt will have improved L knee AROM to at least 0-120 deg to demo improved overall function and maximize sitting tolerance, gait, and stair ambulation   Time 6   Period Weeks   Status New   Target Date 03/15/17     PT LONG TERM GOAL #2   Title Pt will have at least 4/5 L knee MMT to maximize gait and return to PLOF.   Time 6   Period Weeks   Status New     PT LONG TERM GOAL #3   Title Pt will be able to perform SLS for at least 10 sec on LLE to demo improved function and maximize gait.    Baseline (once she is able to fully bear weight through LLE)   Time 6   Period Weeks   Status New     PT LONG TERM GOAL #4   Title Other long term goals will be updated once pt's WB precautions are lifted and pt is able to be out of her immobilizer and further objective testing can be performed. Jac Canavan, PT, DPT               Plan - 02/15/17 1610    Clinical Impression Statement Pt tolerated treatment very well. She is reporting continued decreasing pain and improved ROM. Pt tolerated amb without crutch with no pain and mini squats 0-25 deg. Continue POC as planned   Rehab Potential Good   PT Frequency 3x / week   PT Duration 6 weeks   PT Treatment/Interventions ADLs/Self Care Home Management;Cryotherapy;Electrical Stimulation;Moist Heat;Ultrasound;DME Instruction;Gait training;Stair training;Functional mobility training;Therapeutic activities;Therapeutic exercise;Balance training;Neuromuscular re-education;Patient/family education;Manual techniques;Wheelchair mobility training;Passive range of motion;Scar mobilization;Dry needling;Energy conservation;Taping   PT Next Visit Plan Follow [facility] Orthopedic ACL Allograft Reconstruction Protocol; (further objective testing should be performed once pt is FWB on LLE). WBAT,  sensory TENS concurrent with therex.  Holding compression order at this time as edema is improved. continue WB exercises per protocol   PT Home Exercise Plan eval: supine calf stretch with rope, heel slides, quad sets; 9/19: 4-way SLR with brace  Consulted and Agree with Plan of Care Patient      Patient will benefit from skilled therapeutic intervention in order to improve the following deficits and impairments:  Abnormal gait, Decreased activity tolerance, Decreased balance, Decreased endurance, Decreased mobility, Decreased range of motion, Decreased strength, Difficulty walking, Hypomobility, Increased edema, Increased muscle spasms, Increased fascial restricitons, Impaired flexibility, Pain  Visit Diagnosis: Acute pain of right knee  Stiffness of left knee  Localized edema  Muscle weakness (generalized)  Difficulty in walking, not elsewhere classified     Problem List Patient Active Problem List   Diagnosis Date Noted  . Hypercholesteremia 10/09/2016  . Immunization refused 09/11/2016  . Intermittent asthma without complication 09/11/2016  . Well woman exam with routine gynecological exam 01/13/2016  . Contraceptive management 03/31/2014  . Class 2 obesity due to excess calories without serious comorbidity with body mass index (BMI) of 36.0 to 36.9 in adult 10/14/2013  . Migraines   . HTN (hypertension)   . Multiple allergies        Jac Canavan PT, DPT  [facility] [address] [place], [place], [address] Phone: [phone]   Fax:  [phone]  Name: Deanna Friedman MRN: [id number] Date of Birth: Sep 29, 1975

## 2017-02-15 NOTE — Progress Notes (Signed)
Pt here for Depo. Pt tolerated shot well. Return in 12 weeks for next shot. JSY 

## 2017-02-18 ENCOUNTER — Ambulatory Visit (HOSPITAL_COMMUNITY): Payer: BC Managed Care – PPO | Attending: Specialist | Admitting: Physical Therapy

## 2017-02-18 ENCOUNTER — Encounter (HOSPITAL_COMMUNITY): Payer: Self-pay | Admitting: Physical Therapy

## 2017-02-18 DIAGNOSIS — M25561 Pain in right knee: Secondary | ICD-10-CM

## 2017-02-18 DIAGNOSIS — M25662 Stiffness of left knee, not elsewhere classified: Secondary | ICD-10-CM | POA: Insufficient documentation

## 2017-02-18 DIAGNOSIS — R262 Difficulty in walking, not elsewhere classified: Secondary | ICD-10-CM

## 2017-02-18 DIAGNOSIS — M6281 Muscle weakness (generalized): Secondary | ICD-10-CM | POA: Diagnosis present

## 2017-02-18 DIAGNOSIS — R600 Localized edema: Secondary | ICD-10-CM | POA: Insufficient documentation

## 2017-02-18 NOTE — Therapy (Signed)
[facility] [address] [place], [place], [address] Phone: [phone]   Fax:  [phone]  Physical Therapy Treatment  Patient Details  Name: Deanna Friedman MRN: [id number] Date of Birth: 20-Mar-1976 Referring Provider: Arlan Organ, MD  Encounter Date: 02/18/2017      PT End of Session - 02/18/17 1729    Visit Number 8   Number of Visits 19   Date for PT Re-Evaluation 02/22/17   Authorization Type [employer]   Authorization Time Period 02/01/17 to 03/15/17   PT Start Time 1647   PT Stop Time 1728   PT Time Calculation (min) 41 min   Equipment Utilized During Treatment Left knee immobilizer   Activity Tolerance Patient tolerated treatment well;No increased pain   Behavior During Therapy WFL for tasks assessed/performed      Past Medical History:  Diagnosis Date  . ADHD (attention deficit hyperactivity disorder)   . Allergy    food, meds, mold, dust  . Anemia   . Asthma   . Blood transfusion without reported diagnosis    childbirth  . Body aches 11/29/2014  . BV (bacterial vaginosis) 10/14/2013  . Contraceptive management 03/31/2014  . Decreased sex drive 1/32/4401  . Folliculitis 01/27/2015  . Heart murmur   . HTN (hypertension)   . Hypercholesteremia 10/09/2016  . Migraines   . Multiple allergies   . Obesity 10/14/2013  . Vaginal itching 12/30/2014    Past Surgical History:  Procedure Laterality Date  . ANTERIOR CRUCIATE LIGAMENT REPAIR Left 01/29/2017  . BREAST LUMPECTOMY Right    fibroadenoma  . DILATION AND CURETTAGE OF UTERUS    . KNEE ARTHROSCOPY      There were no vitals filed for this visit.      Subjective Assessment - 02/18/17 1650    Subjective patient arives stating she is doing well, no major changes. She sees her surgeon on the 29th.    Patient Stated Goals get back mobility to where I was before   Currently in Pain? No/denies                         [facility] Adult PT  Treatment/Exercise - 02/18/17 0001      Knee/Hip Exercises: Standing   Heel Raises Both;1 set;15 reps   Heel Raises Limitations heel and toe    Functional Squat --   Functional Squat Limitations --   Gait Training tandem stance 3x20 seconds on foam B    Other Standing Knee Exercises hip hikes 1x10 B    Other Standing Knee Exercises 3 way hip holds 1x10 B      Knee/Hip Exercises: Supine   Quad Sets Left;15 reps   Quad Sets Limitations 5 second holds    Short Arc [employer] Left;20 reps   Short Arc Quad Sets Limitations 3 second holds; quad lag noted    Straight Leg Raises Left;1 set;20 reps   Straight Leg Raises Limitations quad set   extensor lag still noted      Knee/Hip Exercises: Sidelying   Hip ABduction Left;20 reps   Hip ABduction Limitations 0# first set, 1# second set     Knee/Hip Exercises: Prone   Hip Extension 2 sets;15 reps   Hip Extension Limitations 1#      Manual Therapy   Manual Therapy Edema management   Manual therapy comments completed separate rest of treatment   Edema Management retrograde massage with LE elevated with  ankle pumps    Joint Mobilization patella mobs all directions                PT Education - 02/18/17 1728    Education provided No          PT Short Term Goals - 02/01/17 1229      PT SHORT TERM GOAL #1   Title Pt will be independent with HEP and perform consistently in order to maximize return to PLOF.   Time 3   Period Weeks   Target Date 02/22/17     PT SHORT TERM GOAL #2   Title Pt will have improved L knee AROM to 5-100 deg to decrease pain and maximize overall function.   Time 3   Period Weeks   Status New     PT SHORT TERM GOAL #3   Title Pt will have improved MMT of hip musculature to 5/5 to maximize gait.    Time 3   Period Weeks   Status New     PT SHORT TERM GOAL #4   Title Pt will have decreased swelling to at least 15" on the L at joint line to decrease pain and maximize ROM.   Time 3   Period  Weeks   Status New           PT Long Term Goals - 02/01/17 1232      PT LONG TERM GOAL #1   Title Pt will have improved L knee AROM to at least 0-120 deg to demo improved overall function and maximize sitting tolerance, gait, and stair ambulation   Time 6   Period Weeks   Status New   Target Date 03/15/17     PT LONG TERM GOAL #2   Title Pt will have at least 4/5 L knee MMT to maximize gait and return to PLOF.   Time 6   Period Weeks   Status New     PT LONG TERM GOAL #3   Title Pt will be able to perform SLS for at least 10 sec on LLE to demo improved function and maximize gait.    Baseline (once she is able to fully bear weight through LLE)   Time 6   Period Weeks   Status New     PT LONG TERM GOAL #4   Title Other long term goals will be updated once pt's WB precautions are lifted and pt is able to be out of her immobilizer and further objective testing can be performed. Jac Canavan, PT, DPT               Plan - 02/18/17 1729    Clinical Impression Statement Continued with exercises and activities for knee ROM as indicated in [place] [facility] allograft protocol, focusing on week 3 as her surgery was on the 11th. Patient continues to do well within constraints of MD protocol thus far. Noted incision seem to be healing well without signs/symptoms of infection/inflammation but encouraged use of Neosporin on remaining sites. Noted apprehension at idea of performing mini-squats to 40 degrees even though this is currently within MD protocol.    Rehab Potential Good   PT Frequency 3x / week   PT Duration 6 weeks   PT Treatment/Interventions ADLs/Self Care Home Management;Cryotherapy;Electrical Stimulation;Moist Heat;Ultrasound;DME Instruction;Gait training;Stair training;Functional mobility training;Therapeutic activities;Therapeutic exercise;Balance training;Neuromuscular re-education;Patient/family education;Manual techniques;Wheelchair mobility training;Passive range of  motion;Scar mobilization;Dry needling;Energy conservation;Taping   PT Next Visit Plan Follow [facility] Orthopedic ACL Allograft Reconstruction Protocol; (further objective  testing should be performed once pt is FWB on LLE). WBAT, sensory TENS concurrent with therex.  Holding compression order at this time as edema is improved. continue WB exercises per protocol   PT Home Exercise Plan eval: supine calf stretch with rope, heel slides, quad sets; 9/19: 4-way SLR with brace   Consulted and Agree with Plan of Care Patient      Patient will benefit from skilled therapeutic intervention in order to improve the following deficits and impairments:  Abnormal gait, Decreased activity tolerance, Decreased balance, Decreased endurance, Decreased mobility, Decreased range of motion, Decreased strength, Difficulty walking, Hypomobility, Increased edema, Increased muscle spasms, Increased fascial restricitons, Impaired flexibility, Pain  Visit Diagnosis: Acute pain of right knee  Stiffness of left knee  Localized edema  Muscle weakness (generalized)  Difficulty in walking, not elsewhere classified     Problem List Patient Active Problem List   Diagnosis Date Noted  . Hypercholesteremia 10/09/2016  . Immunization refused 09/11/2016  . Intermittent asthma without complication 09/11/2016  . Well woman exam with routine gynecological exam 01/13/2016  . Contraceptive management 03/31/2014  . Class 2 obesity due to excess calories without serious comorbidity with body mass index (BMI) of 36.0 to 36.9 in adult 10/14/2013  . Migraines   . HTN (hypertension)   . Multiple allergies     Nedra Hai PT, DPT [phone]  [facility] [address] [place], [place], [address] Phone: [phone]   Fax:  [phone]  Name: TYNIAH KASTENS MRN: [id number] Date of Birth: 10/16/1975

## 2017-02-20 ENCOUNTER — Encounter (HOSPITAL_COMMUNITY): Payer: Self-pay

## 2017-02-20 ENCOUNTER — Ambulatory Visit (HOSPITAL_COMMUNITY): Payer: BC Managed Care – PPO

## 2017-02-20 DIAGNOSIS — R600 Localized edema: Secondary | ICD-10-CM

## 2017-02-20 DIAGNOSIS — M25561 Pain in right knee: Secondary | ICD-10-CM | POA: Diagnosis not present

## 2017-02-20 DIAGNOSIS — M6281 Muscle weakness (generalized): Secondary | ICD-10-CM

## 2017-02-20 DIAGNOSIS — M25662 Stiffness of left knee, not elsewhere classified: Secondary | ICD-10-CM

## 2017-02-20 DIAGNOSIS — R262 Difficulty in walking, not elsewhere classified: Secondary | ICD-10-CM

## 2017-02-20 NOTE — Therapy (Signed)
[place] [facility] [address] [place], [place], [address] Phone: [phone]   Fax:  [phone]  Physical Therapy Treatment  Patient Details  Name: Deanna Friedman MRN: [id number] Date of Birth: 10-23-1975 Referring Provider: Arlan Organ, MD  Encounter Date: 02/20/2017      PT End of Session - 02/20/17 0816    Visit Number 9   Number of Visits 19   Date for PT Re-Evaluation 02/22/17   Authorization Type [employer]   Authorization Time Period 02/01/17 to 03/15/17   PT Start Time 0817   PT Stop Time 0857   PT Time Calculation (min) 40 min   Equipment Utilized During Treatment Left knee immobilizer   Activity Tolerance Patient tolerated treatment well;No increased pain   Behavior During Therapy WFL for tasks assessed/performed      Past Medical History:  Diagnosis Date  . ADHD (attention deficit hyperactivity disorder)   . Allergy    food, meds, mold, dust  . Anemia   . Asthma   . Blood transfusion without reported diagnosis    childbirth  . Body aches 11/29/2014  . BV (bacterial vaginosis) 10/14/2013  . Contraceptive management 03/31/2014  . Decreased sex drive 6/96/2952  . Folliculitis 01/27/2015  . Heart murmur   . HTN (hypertension)   . Hypercholesteremia 10/09/2016  . Migraines   . Multiple allergies   . Obesity 10/14/2013  . Vaginal itching 12/30/2014    Past Surgical History:  Procedure Laterality Date  . ANTERIOR CRUCIATE LIGAMENT REPAIR Left 01/29/2017  . BREAST LUMPECTOMY Right    fibroadenoma  . DILATION AND CURETTAGE OF UTERUS    . KNEE ARTHROSCOPY      There were no vitals filed for this visit.      Subjective Assessment - 02/20/17 0816    Subjective Pt states that she is hurting a little bit this morning because she did not sleep good last night. She didn't get to ice as much this morning.    Patient Stated Goals get back mobility to where I was before   Currently in Pain? Yes   Pain Score 6    Pain Location Knee   Pain Orientation Left   Pain Descriptors / Indicators Tightness   Pain Type Surgical pain   Pain Onset 1 to 4 weeks ago   Pain Frequency Intermittent   Aggravating Factors  being up on it for a long time, brace being too tight   Pain Relieving Factors ice, rest, elevation, meds   Effect of Pain on Daily Activities increases               [facility] Adult PT Treatment/Exercise - 02/20/17 0001      Knee/Hip Exercises: Standing   Terminal Knee Extension Limitations x10 reps with RTB with NMES (see parameters in supine quad sets)   Other Standing Knee Exercises lateral weight shifts on foam x3 mins total     Knee/Hip Exercises: Seated   Long Arc Quad Left;15 reps   Long Arc Quad Limitations 90 to 45 deg     Knee/Hip Exercises: Supine   Quad Sets Left;20 reps   [employer] Limitations 10 sec holds with [place] Estim (10/10 cycle time, 5 sec ramp, 20mA)     Manual Therapy   Manual Therapy Edema management;Joint mobilization   Manual therapy comments completed separate rest of treatment   Edema Management retrograde massage with LE elevated with ankle pumps    Joint Mobilization patella mobs  all directions                 PT Short Term Goals - 02/01/17 1229      PT SHORT TERM GOAL #1   Title Pt will be independent with HEP and perform consistently in order to maximize return to PLOF.   Time 3   Period Weeks   Target Date 02/22/17     PT SHORT TERM GOAL #2   Title Pt will have improved L knee AROM to 5-100 deg to decrease pain and maximize overall function.   Time 3   Period Weeks   Status New     PT SHORT TERM GOAL #3   Title Pt will have improved MMT of hip musculature to 5/5 to maximize gait.    Time 3   Period Weeks   Status New     PT SHORT TERM GOAL #4   Title Pt will have decreased swelling to at least 15" on the L at joint line to decrease pain and maximize ROM.   Time 3   Period Weeks   Status New           PT Long Term  Goals - 02/01/17 1232      PT LONG TERM GOAL #1   Title Pt will have improved L knee AROM to at least 0-120 deg to demo improved overall function and maximize sitting tolerance, gait, and stair ambulation   Time 6   Period Weeks   Status New   Target Date 03/15/17     PT LONG TERM GOAL #2   Title Pt will have at least 4/5 L knee MMT to maximize gait and return to PLOF.   Time 6   Period Weeks   Status New     PT LONG TERM GOAL #3   Title Pt will be able to perform SLS for at least 10 sec on LLE to demo improved function and maximize gait.    Baseline (once she is able to fully bear weight through LLE)   Time 6   Period Weeks   Status New     PT LONG TERM GOAL #4   Title Other long term goals will be updated once pt's WB precautions are lifted and pt is able to be out of her immobilizer and further objective testing can be performed. Jac Canavan, PT, DPT               Plan - 02/20/17 [phone]    Clinical Impression Statement Began session with [place] stim to L quad to facilitate improved quad contraction during quad sets and standing TKEs. Pt tolerated well and reported she could tell it was an improved contraction. Rest of session focused on ROM and decreasing swelling as pt was having increased pain this date. Pt reported improved symptoms at EOS.    Rehab Potential Good   PT Frequency 3x / week   PT Duration 6 weeks   PT Treatment/Interventions ADLs/Self Care Home Management;Cryotherapy;Electrical Stimulation;Moist Heat;Ultrasound;DME Instruction;Gait training;Stair training;Functional mobility training;Therapeutic activities;Therapeutic exercise;Balance training;Neuromuscular re-education;Patient/family education;Manual techniques;Wheelchair mobility training;Passive range of motion;Scar mobilization;Dry needling;Energy conservation;Taping   PT Next Visit Plan Follow [facility] Orthopedic ACL Allograft Reconstruction Protocol; (further objective testing should be performed  once pt is FWB on LLE). Do not perform SAQ as it puts increased strain on the ACL in NWB. WBAT, continue [place] estim for improved quad contraction during quad exercises (quad sets, SLR, etc.) continue WB exercises per protocol   PT Home  Exercise Plan eval: supine calf stretch with rope, heel slides, quad sets; 9/19: 4-way SLR with brace   Consulted and Agree with Plan of Care Patient      Patient will benefit from skilled therapeutic intervention in order to improve the following deficits and impairments:  Abnormal gait, Decreased activity tolerance, Decreased balance, Decreased endurance, Decreased mobility, Decreased range of motion, Decreased strength, Difficulty walking, Hypomobility, Increased edema, Increased muscle spasms, Increased fascial restricitons, Impaired flexibility, Pain  Visit Diagnosis: Acute pain of right knee  Stiffness of left knee  Localized edema  Muscle weakness (generalized)  Difficulty in walking, not elsewhere classified     Problem List Patient Active Problem List   Diagnosis Date Noted  . Hypercholesteremia 10/09/2016  . Immunization refused 09/11/2016  . Intermittent asthma without complication 09/11/2016  . Well woman exam with routine gynecological exam 01/13/2016  . Contraceptive management 03/31/2014  . Class 2 obesity due to excess calories without serious comorbidity with body mass index (BMI) of 36.0 to 36.9 in adult 10/14/2013  . Migraines   . HTN (hypertension)   . Multiple allergies        Jac Canavan PT, DPT   [facility] [address] [place], [place], [address] Phone: [phone]   Fax:  [phone]  Name: Deanna Friedman MRN: [id number] Date of Birth: 18-Mar-1976

## 2017-02-22 ENCOUNTER — Encounter (HOSPITAL_COMMUNITY): Payer: Self-pay

## 2017-02-22 ENCOUNTER — Ambulatory Visit (HOSPITAL_COMMUNITY): Payer: BC Managed Care – PPO

## 2017-02-22 DIAGNOSIS — M25561 Pain in right knee: Secondary | ICD-10-CM

## 2017-02-22 DIAGNOSIS — M25662 Stiffness of left knee, not elsewhere classified: Secondary | ICD-10-CM

## 2017-02-22 DIAGNOSIS — R600 Localized edema: Secondary | ICD-10-CM

## 2017-02-22 DIAGNOSIS — R262 Difficulty in walking, not elsewhere classified: Secondary | ICD-10-CM

## 2017-02-22 DIAGNOSIS — M6281 Muscle weakness (generalized): Secondary | ICD-10-CM

## 2017-02-22 NOTE — Therapy (Signed)
[place] [facility] [address] [place], [place], [address] Phone: [phone]   Fax:  [phone]  Physical Therapy Treatment  Patient Details  Name: Deanna Friedman Friedman MRN: [id number] Date of Birth: 06-Aug-1975 Referring Provider: Arlan Organ, MD  Encounter Date: 02/22/2017      PT End of Session - 02/22/17 1644    Visit Number 10   Number of Visits 19   Date for PT Re-Evaluation 02/22/17   Authorization Type [employer]   Authorization Time Period 02/01/17 to 03/15/17   PT Start Time 0355   PT Stop Time 0445   PT Time Calculation (min) 50 min   Equipment Utilized During Treatment Left knee immobilizer   Activity Tolerance Patient tolerated treatment well;No increased pain   Behavior During Therapy WFL for tasks assessed/performed      Past Medical History:  Diagnosis Date  . ADHD (attention deficit hyperactivity disorder)   . Allergy    food, meds, mold, dust  . Anemia   . Asthma   . Blood transfusion without reported diagnosis    childbirth  . Body aches 11/29/2014  . BV (bacterial vaginosis) 10/14/2013  . Contraceptive management 03/31/2014  . Decreased sex drive 6/96/2952  . Folliculitis 01/27/2015  . Heart murmur   . HTN (hypertension)   . Hypercholesteremia 10/09/2016  . Migraines   . Multiple allergies   . Obesity 10/14/2013  . Vaginal itching 12/30/2014    Past Surgical History:  Procedure Laterality Date  . ANTERIOR CRUCIATE LIGAMENT REPAIR Left 01/29/2017  . BREAST LUMPECTOMY Right    fibroadenoma  . DILATION AND CURETTAGE OF UTERUS    . KNEE ARTHROSCOPY      There were no vitals filed for this visit.      Subjective Assessment - 02/22/17 1556    Subjective Pt states that she was in significant amount of pain after last session and an hour later was in 10/10 pain. She notes that it's from the electrodes and has taken two days to calm back down including some increased swelling.    Limitations  Standing;Walking;House hold activities   How long can you sit comfortably? no issues   How long can you stand comfortably? 5-10 mins, not very long   How long can you walk comfortably? TTWD with crutches, "till my arms give out"   Patient Stated Goals get back mobility to where I was before   Pain Score 4    Pain Location Knee   Pain Orientation Left   Pain Descriptors / Indicators Tightness                         [facility] Adult PT Treatment/Exercise - 02/22/17 0001      Knee/Hip Exercises: Standing   Heel Raises Both;1 set;20 reps   Heel Raises Limitations heel and toe    Functional Squat 20 reps   Functional Squat Limitations very mini squats (0 to about 20 deg, but per protocol can go to 40 deg)     Knee/Hip Exercises: Seated   Long Arc Quad Left;15 reps   Long Arc Quad Limitations 90 to 45 deg     Knee/Hip Exercises: Supine   Quad Sets Left;2 sets;10 reps   Quad Sets Limitations 5 sec hold     Manual Therapy   Manual Therapy Edema management;Joint mobilization   Manual therapy comments completed separate rest of treatment   Edema Management retrograde massage with LE elevated  with ankle pumps    Joint Mobilization patella mobs all directions                  PT Short Term Goals - 02/01/17 1229      PT SHORT TERM GOAL #1   Title Pt will be independent with HEP and perform consistently in order to maximize return to PLOF.   Time 3   Period Weeks   Target Date 02/22/17     PT SHORT TERM GOAL #2   Title Pt will have improved L knee AROM to 5-100 deg to decrease pain and maximize overall function.   Time 3   Period Weeks   Status New     PT SHORT TERM GOAL #3   Title Pt will have improved MMT of hip musculature to 5/5 to maximize gait.    Time 3   Period Weeks   Status New     PT SHORT TERM GOAL #4   Title Pt will have decreased swelling to at least 15" on the L at joint line to decrease pain and maximize ROM.   Time 3   Period Weeks    Status New           PT Long Term Goals - 02/01/17 1232      PT LONG TERM GOAL #1   Title Pt will have improved L knee AROM to at least 0-120 deg to demo improved overall function and maximize sitting tolerance, gait, and stair ambulation   Time 6   Period Weeks   Status New   Target Date 03/15/17     PT LONG TERM GOAL #2   Title Pt will have at least 4/5 L knee MMT to maximize gait and return to PLOF.   Time 6   Period Weeks   Status New     PT LONG TERM GOAL #3   Title Pt will be able to perform SLS for at least 10 sec on LLE to demo improved function and maximize gait.    Baseline (once she is able to fully bear weight through LLE)   Time 6   Period Weeks   Status New     PT LONG TERM GOAL #4   Title Other long term goals will be updated once pt's WB precautions are lifted and pt is able to be out of her immobilizer and further objective testing can be performed. Jac Canavan, PT, DPT               Plan - 02/22/17 1810    Clinical Impression Statement Today's session focused on Lt. knee ROM and swelling control. Continued focus on quad contraction this session with various quad strengthening muscles. She denied any increase in symptoms post session. She notes the functional squats are feeling pretty good today. 3/10 pain EOS.    Rehab Potential Good   PT Frequency 3x / week   PT Duration 6 weeks   PT Treatment/Interventions ADLs/Self Care Home Management;Cryotherapy;Electrical Stimulation;Moist Heat;Ultrasound;DME Instruction;Gait training;Stair training;Functional mobility training;Therapeutic activities;Therapeutic exercise;Balance training;Neuromuscular re-education;Patient/family education;Manual techniques;Wheelchair mobility training;Passive range of motion;Scar mobilization;Dry needling;Energy conservation;Taping   PT Next Visit Plan Follow [facility] Orthopedic ACL Allograft Reconstruction Protocol; (further objective testing should be performed once pt  is FWB on LLE). Do not perform SAQ as it puts increased strain on the ACL in NWB. WBAT, continue [place] estim for improved quad contraction during quad exercises (quad sets, SLR, etc.) continue WB exercises per protocol   PT Home Exercise  Plan eval: supine calf stretch with rope, heel slides, quad sets; 9/19: 4-way SLR with brace   Consulted and Agree with Plan of Care Patient      Patient will benefit from skilled therapeutic intervention in order to improve the following deficits and impairments:  Abnormal gait, Decreased activity tolerance, Decreased balance, Decreased endurance, Decreased mobility, Decreased range of motion, Decreased strength, Difficulty walking, Hypomobility, Increased edema, Increased muscle spasms, Increased fascial restricitons, Impaired flexibility, Pain  Visit Diagnosis: Acute pain of right knee  Stiffness of left knee  Localized edema  Muscle weakness (generalized)  Difficulty in walking, not elsewhere classified     Problem List Patient Active Problem List   Diagnosis Date Noted  . Hypercholesteremia 10/09/2016  . Immunization refused 09/11/2016  . Intermittent asthma without complication 09/11/2016  . Well woman exam with routine gynecological exam 01/13/2016  . Contraceptive management 03/31/2014  . Class 2 obesity due to excess calories without serious comorbidity with body mass index (BMI) of 36.0 to 36.9 in adult 10/14/2013  . Migraines   . HTN (hypertension)   . Multiple allergies    Candise Che PT, DPT 6:11 PM, 02/22/17 [phone]  [facility] [address] [place], [place], [address] Phone: [phone]   Fax:  [phone]  Name: Deanna Friedman Friedman MRN: [id number] Date of Birth: 27-Feb-1976

## 2017-02-25 ENCOUNTER — Ambulatory Visit (HOSPITAL_COMMUNITY): Payer: BC Managed Care – PPO | Admitting: Physical Therapy

## 2017-02-25 ENCOUNTER — Other Ambulatory Visit: Payer: Self-pay | Admitting: Adult Health

## 2017-02-25 ENCOUNTER — Encounter (HOSPITAL_COMMUNITY): Payer: Self-pay | Admitting: Physical Therapy

## 2017-02-25 DIAGNOSIS — R262 Difficulty in walking, not elsewhere classified: Secondary | ICD-10-CM

## 2017-02-25 DIAGNOSIS — M25662 Stiffness of left knee, not elsewhere classified: Secondary | ICD-10-CM

## 2017-02-25 DIAGNOSIS — M6281 Muscle weakness (generalized): Secondary | ICD-10-CM

## 2017-02-25 DIAGNOSIS — M25561 Pain in right knee: Secondary | ICD-10-CM | POA: Diagnosis not present

## 2017-02-25 DIAGNOSIS — R600 Localized edema: Secondary | ICD-10-CM

## 2017-02-25 NOTE — Therapy (Signed)
[facility] [address] [place], [place], [address] Phone: [phone]   Fax:  [phone]  Physical Therapy Treatment (Mini-Reassess)  Patient Details  Name: Deanna Friedman MRN: [id number] Date of Birth: [birth year] Referring Provider: Arlan Organ, MD  Encounter Date: 02/25/2017      PT End of Session - 02/25/17 1728    Visit Number 11   Number of Visits 19   Date for PT Re-Evaluation 02/22/17   Authorization Type [employer]   Authorization Time Period 02/01/17 to 03/15/17   PT Start Time 1630   PT Stop Time 1714   PT Time Calculation (min) 44 min   Equipment Utilized During Treatment Left knee immobilizer   Activity Tolerance Patient tolerated treatment well   Behavior During Therapy [facility] for tasks assessed/performed      Past Medical History:  Diagnosis Date  . ADHD (attention deficit hyperactivity disorder)   . Allergy    food, meds, mold, dust  . Anemia   . Asthma   . Blood transfusion without reported diagnosis    childbirth  . Body aches 11/29/2014  . BV (bacterial vaginosis) 10/14/2013  . Contraceptive management 03/31/2014  . Decreased sex drive 6/96/2952  . Folliculitis 01/27/2015  . Heart murmur   . HTN (hypertension)   . Hypercholesteremia 10/09/2016  . Migraines   . Multiple allergies   . Obesity 10/14/2013  . Vaginal itching 12/30/2014    Past Surgical History:  Procedure Laterality Date  . ANTERIOR CRUCIATE LIGAMENT REPAIR Left 01/29/2017  . BREAST LUMPECTOMY Right    fibroadenoma  . DILATION AND CURETTAGE OF UTERUS    . KNEE ARTHROSCOPY      There were no vitals filed for this visit.      Subjective Assessment - 02/25/17 1632    Subjective Patient arrives today stating she is doing well; she reports that she feels like her pain and swelling really increased after the electrodes last week. Otherwise she is feeling fairly well, it has just been a struggle keeping it down since Wednesday.     How long can you sit comfortably? 10/8- unlimited    How long can you stand comfortably? 10/8- 5-10 minutes    How long can you walk comfortably? 10/8- WBAT; 5-10 minutes    Patient Stated Goals get back mobility to where I was before   Currently in Pain? Yes   Pain Score 3    Pain Location Knee   Pain Orientation Left            [facility] PT Assessment - 02/25/17 0001      AROM   Left Knee Extension -10   Left Knee Flexion 89  following retrograde massage and ice massage      Strength   Left Hip Extension 4/5   Left Hip ABduction 4/5                     [facility] Adult PT Treatment/Exercise - 02/25/17 0001      Manual Therapy   Manual Therapy Edema management;Other (comment)   Manual therapy comments completed separate rest of treatment   Edema Management retrograde massage with LE elevated with ankle pumps    Other Manual Therapy ice massage L knee, education provided throughout                 PT Education - 02/25/17 1726    Education provided Yes   Education Details extensive education on  usual mechanicsm of [place] estim/excess pain and edema being very uncommon outcome; POC for today moving forward; equipment requests including cryocuff and compression stocking; education regarding general ACL protocol and ROM at end of today's session    Person(s) Educated Patient   Methods Explanation   Comprehension Verbalized understanding          PT Short Term Goals - 02/01/17 1229      PT SHORT TERM GOAL #1   Title Pt will be independent with HEP and perform consistently in order to maximize return to PLOF.   Time 3   Period Weeks   Target Date 02/22/17     PT SHORT TERM GOAL #2   Title Pt will have improved L knee AROM to 5-100 deg to decrease pain and maximize overall function.   Time 3   Period Weeks   Status New     PT SHORT TERM GOAL #3   Title Pt will have improved MMT of hip musculature to 5/5 to maximize gait.    Time 3   Period Weeks    Status New     PT SHORT TERM GOAL #4   Title Pt will have decreased swelling to at least 15" on the L at joint line to decrease pain and maximize ROM.   Time 3   Period Weeks   Status New           PT Long Term Goals - 02/01/17 1232      PT LONG TERM GOAL #1   Title Pt will have improved L knee AROM to at least 0-120 deg to demo improved overall function and maximize sitting tolerance, gait, and stair ambulation   Time 6   Period Weeks   Status New   Target Date 03/15/17     PT LONG TERM GOAL #2   Title Pt will have at least 4/5 L knee MMT to maximize gait and return to PLOF.   Time 6   Period Weeks   Status New     PT LONG TERM GOAL #3   Title Pt will be able to perform SLS for at least 10 sec on LLE to demo improved function and maximize gait.    Baseline (once she is able to fully bear weight through LLE)   Time 6   Period Weeks   Status New     PT LONG TERM GOAL #4   Title Other long term goals will be updated once pt's WB precautions are lifted and pt is able to be out of her immobilizer and further objective testing can be performed. Jac Canavan, PT, DPT               Plan - 02/25/17 1728    Clinical Impression Statement Mini-reassessment performed today. Patient reports that her knee has been very swollen and painful ever since [place] E-stim was done last week, she is very hesitant to do this again; she has continued to work on her ROM but just feels stuck and is a bit frustrated. Examination reveals ongoing limited L knee ROM as well ongoing strength deficits. Consulted evaluating PT Jac Canavan), who is aware of patient status and in agreement with cryocuff and compression stocking recommendations. Focused today's session on edema and pain control. Continue to recommend skilled PT services moving forward per protocol and patient tolerance.    Rehab Potential Good   PT Frequency 3x / week   PT Duration 6 weeks   PT Treatment/Interventions ADLs/Self  Care Home Management;Cryotherapy;Electrical Stimulation;Moist Heat;Ultrasound;DME Instruction;Gait training;Stair training;Functional mobility training;Therapeutic activities;Therapeutic exercise;Balance training;Neuromuscular re-education;Patient/family education;Manual techniques;Wheelchair mobility training;Passive range of motion;Scar mobilization;Dry needling;Energy conservation;Taping   PT Next Visit Plan Follow [facility] Orthopedic ACL Allograft Reconstruction Protocol; (further objective testing should be performed once pt is FWB on LLE). Do not perform SAQ as it puts increased strain on the ACL in NWB. WBAT, may continue with estim but caution with [place] setting; continue WB exercises per protocol   PT Home Exercise Plan eval: supine calf stretch with rope, heel slides, quad sets; 9/19: 4-way SLR with brace   Consulted and Agree with Plan of Care Patient      Patient will benefit from skilled therapeutic intervention in order to improve the following deficits and impairments:  Abnormal gait, Decreased activity tolerance, Decreased balance, Decreased endurance, Decreased mobility, Decreased range of motion, Decreased strength, Difficulty walking, Hypomobility, Increased edema, Increased muscle spasms, Increased fascial restricitons, Impaired flexibility, Pain  Visit Diagnosis: Acute pain of right knee  Stiffness of left knee  Localized edema  Muscle weakness (generalized)  Difficulty in walking, not elsewhere classified     Problem List Patient Active Problem List   Diagnosis Date Noted  . Hypercholesteremia 10/09/2016  . Immunization refused 09/11/2016  . Intermittent asthma without complication 09/11/2016  . Well woman exam with routine gynecological exam 01/13/2016  . Contraceptive management 03/31/2014  . Class 2 obesity due to excess calories without serious comorbidity with body mass index (BMI) of 36.0 to 36.9 in adult 10/14/2013  . Migraines   . HTN (hypertension)    . Multiple allergies     Nedra Hai PT, DPT [phone]  [facility] [address] [place], [place], [address] Phone: [phone]   Fax:  [phone]  Name: Deanna Friedman MRN: [id number] Date of Birth: 1975-11-22

## 2017-02-27 ENCOUNTER — Encounter (HOSPITAL_COMMUNITY): Payer: Self-pay

## 2017-02-27 ENCOUNTER — Ambulatory Visit (HOSPITAL_COMMUNITY): Payer: BC Managed Care – PPO

## 2017-02-27 DIAGNOSIS — M25662 Stiffness of left knee, not elsewhere classified: Secondary | ICD-10-CM

## 2017-02-27 DIAGNOSIS — M25561 Pain in right knee: Secondary | ICD-10-CM

## 2017-02-27 DIAGNOSIS — M6281 Muscle weakness (generalized): Secondary | ICD-10-CM

## 2017-02-27 DIAGNOSIS — R600 Localized edema: Secondary | ICD-10-CM

## 2017-02-27 DIAGNOSIS — R262 Difficulty in walking, not elsewhere classified: Secondary | ICD-10-CM

## 2017-02-27 NOTE — Therapy (Signed)
[facility] [address] [place], [place], [address] Phone: [phone]   Fax:  [phone]  Physical Therapy Treatment  Patient Details  Name: Deanna Friedman MRN: [id number] Date of Birth: 02-09-1976 Referring Provider: Arlan Organ, MD  Encounter Date: 02/27/2017      PT End of Session - 02/27/17 1608    Visit Number 12   Number of Visits 19   Authorization Type [employer]   Authorization Time Period 02/01/17 to 03/15/17   PT Start Time 1602   PT Stop Time 1646   PT Time Calculation (min) 44 min   Equipment Utilized During Treatment Left knee immobilizer   Activity Tolerance Patient tolerated treatment well;No increased pain   Behavior During Therapy WFL for tasks assessed/performed      Past Medical History:  Diagnosis Date  . ADHD (attention deficit hyperactivity disorder)   . Allergy    food, meds, mold, dust  . Anemia   . Asthma   . Blood transfusion without reported diagnosis    childbirth  . Body aches 11/29/2014  . BV (bacterial vaginosis) 10/14/2013  . Contraceptive management 03/31/2014  . Decreased sex drive 09/01/2438  . Folliculitis 01/27/2015  . Heart murmur   . HTN (hypertension)   . Hypercholesteremia 10/09/2016  . Migraines   . Multiple allergies   . Obesity 10/14/2013  . Vaginal itching 12/30/2014    Past Surgical History:  Procedure Laterality Date  . ANTERIOR CRUCIATE LIGAMENT REPAIR Left 01/29/2017  . BREAST LUMPECTOMY Right    fibroadenoma  . DILATION AND CURETTAGE OF UTERUS    . KNEE ARTHROSCOPY      There were no vitals filed for this visit.      Subjective Assessment - 02/27/17 1605    Subjective Pt stated pain scale 2/10 mainly tight, sore and achey on lateral spot.  Does continue to have swelling under knee cap.   Patient Stated Goals get back mobility to where I was before   Currently in Pain? Yes   Pain Score 2    Pain Location Knee   Pain Orientation Left   Pain  Descriptors / Indicators Aching;Sore;Tightness   Pain Type Surgical pain   Pain Onset 1 to 4 weeks ago   Pain Frequency Intermittent   Aggravating Factors  being up on it for a long tiime, brace being too tight   Pain Relieving Factors ice, rest, elevation, meds   Effect of Pain on Daily Activities increases                         [facility] Adult PT Treatment/Exercise - 02/27/17 0001      Knee/Hip Exercises: Standing   Heel Raises Both;1 set;20 reps   Heel Raises Limitations heel and toe    Knee Flexion Left;15 reps   Terminal Knee Extension Limitations 20x 5" holds with BTB   Functional Squat 20 reps   Functional Squat Limitations very mini squats (0 to about 20 deg, but per protocol can go to 40 deg)     Knee/Hip Exercises: Supine   Quad Sets 15 reps   Heel Slides 15 reps;Left   Straight Leg Raises Left;2 sets;10 reps   Straight Leg Raises Limitations quad set prior SLR, quad limited by fatigue minimal lag noted   Knee Extension PROM;2 sets  PROM to 0 degrees   Knee Extension Limitations 3   Knee Flexion Limitations 87     Manual  Therapy   Manual Therapy Edema management;Other (comment)   Manual therapy comments completed separate rest of treatment   Edema Management retrograde massage with LE elevated with ankle pumps                   PT Short Term Goals - 02/01/17 1229      PT SHORT TERM GOAL #1   Title Pt will be independent with HEP and perform consistently in order to maximize return to PLOF.   Time 3   Period Weeks   Target Date 02/22/17     PT SHORT TERM GOAL #2   Title Pt will have improved L knee AROM to 5-100 deg to decrease pain and maximize overall function.   Time 3   Period Weeks   Status New     PT SHORT TERM GOAL #3   Title Pt will have improved MMT of hip musculature to 5/5 to maximize gait.    Time 3   Period Weeks   Status New     PT SHORT TERM GOAL #4   Title Pt will have decreased swelling to at least 15" on the  L at joint line to decrease pain and maximize ROM.   Time 3   Period Weeks   Status New           PT Long Term Goals - 02/01/17 1232      PT LONG TERM GOAL #1   Title Pt will have improved L knee AROM to at least 0-120 deg to demo improved overall function and maximize sitting tolerance, gait, and stair ambulation   Time 6   Period Weeks   Status New   Target Date 03/15/17     PT LONG TERM GOAL #2   Title Pt will have at least 4/5 L knee MMT to maximize gait and return to PLOF.   Time 6   Period Weeks   Status New     PT LONG TERM GOAL #3   Title Pt will be able to perform SLS for at least 10 sec on LLE to demo improved function and maximize gait.    Baseline (once she is able to fully bear weight through LLE)   Time 6   Period Weeks   Status New     PT LONG TERM GOAL #4   Title Other long term goals will be updated once pt's WB precautions are lifted and pt is able to be out of her immobilizer and further objective testing can be performed. Jac Canavan, PT, DPT               Plan - 02/27/17 1828    Clinical Impression Statement Session focus per ACL protocol with focus on knee mobility and quad strengthening.  Pt continues to have edema present proximal knee, began session with retro massage for edema control prior therex.  Improved AROM with 3 degrees lacking though does continues to report catching/pull inside knee with flexion at 87 degrees.  Pt able to tolerate PROM to 0 degrees without complaint.  No reports of increased pain through session, did discuss beneftis of ice for edema control.     Rehab Potential Good   PT Frequency 3x / week   PT Duration 6 weeks   PT Treatment/Interventions ADLs/Self Care Home Management;Cryotherapy;Electrical Stimulation;Moist Heat;Ultrasound;DME Instruction;Gait training;Stair training;Functional mobility training;Therapeutic activities;Therapeutic exercise;Balance training;Neuromuscular re-education;Patient/family  education;Manual techniques;Wheelchair mobility training;Passive range of motion;Scar mobilization;Dry needling;Energy conservation;Taping   PT Next Visit Plan Follow [facility]  Orthopedic ACL Allograft Reconstruction Protocol; (further objective testing should be performed once pt is FWB on LLE). Do not perform SAQ as it puts increased strain on the ACL in NWB. WBAT, may continue with estim but caution with [place] setting; continue WB exercises per protocol   PT Home Exercise Plan eval: supine calf stretch with rope, heel slides, quad sets; 9/19: 4-way SLR with brace      Patient will benefit from skilled therapeutic intervention in order to improve the following deficits and impairments:  Abnormal gait, Decreased activity tolerance, Decreased balance, Decreased endurance, Decreased mobility, Decreased range of motion, Decreased strength, Difficulty walking, Hypomobility, Increased edema, Increased muscle spasms, Increased fascial restricitons, Impaired flexibility, Pain  Visit Diagnosis: Acute pain of right knee  Stiffness of left knee  Localized edema  Muscle weakness (generalized)  Difficulty in walking, not elsewhere classified     Problem List Patient Active Problem List   Diagnosis Date Noted  . Hypercholesteremia 10/09/2016  . Immunization refused 09/11/2016  . Intermittent asthma without complication 09/11/2016  . Well woman exam with routine gynecological exam 01/13/2016  . Contraceptive management 03/31/2014  . Class 2 obesity due to excess calories without serious comorbidity with body mass index (BMI) of 36.0 to 36.9 in adult 10/14/2013  . Migraines   . HTN (hypertension)   . Multiple allergies    Becky Sax, [place]; CBIS [phone]  Juel Burrow 02/27/2017, 6:35 PM  [facility] [address] [place], [place], [address] Phone: [phone]   Fax:  [phone]  Name: Deanna Friedman MRN: [id number] Date  of Birth: 1976-04-03

## 2017-03-01 ENCOUNTER — Ambulatory Visit (HOSPITAL_COMMUNITY): Payer: BC Managed Care – PPO

## 2017-03-01 ENCOUNTER — Telehealth (HOSPITAL_COMMUNITY): Payer: Self-pay

## 2017-03-01 DIAGNOSIS — M25561 Pain in right knee: Secondary | ICD-10-CM | POA: Diagnosis not present

## 2017-03-01 DIAGNOSIS — M6281 Muscle weakness (generalized): Secondary | ICD-10-CM

## 2017-03-01 DIAGNOSIS — M25662 Stiffness of left knee, not elsewhere classified: Secondary | ICD-10-CM

## 2017-03-01 DIAGNOSIS — R262 Difficulty in walking, not elsewhere classified: Secondary | ICD-10-CM

## 2017-03-01 DIAGNOSIS — R600 Localized edema: Secondary | ICD-10-CM

## 2017-03-01 NOTE — Telephone Encounter (Signed)
Called patient to confirm clinic opening today and confirm ability to make today's schedule appointment. Patient confirmed and moved up appointment time.   Candise Che PT, DPT 9:07 AM, 03/01/17 [phone]

## 2017-03-01 NOTE — Therapy (Signed)
[facility] [address] [place], [place], [address] Phone: [phone]   Fax:  [phone]  Physical Therapy Treatment  Patient Details  Name: Deanna Friedman MRN: [id number] Date of Birth: 1975/07/09 Referring Provider: Arlan Organ, MD  Encounter Date: 03/01/2017      PT End of Session - 03/01/17 1357    Visit Number 13   Number of Visits 19   Date for PT Re-Evaluation 03/15/17   Authorization Type [employer]   Authorization Time Period 02/01/17 to 03/15/17   PT Start Time 1306   PT Stop Time 1354   PT Time Calculation (min) 48 min   Equipment Utilized During Treatment Left knee immobilizer   Activity Tolerance Patient tolerated treatment well;No increased pain   Behavior During Therapy WFL for tasks assessed/performed      Past Medical History:  Diagnosis Date  . ADHD (attention deficit hyperactivity disorder)   . Allergy    food, meds, mold, dust  . Anemia   . Asthma   . Blood transfusion without reported diagnosis    childbirth  . Body aches 11/29/2014  . BV (bacterial vaginosis) 10/14/2013  . Contraceptive management 03/31/2014  . Decreased sex drive 6/96/2952  . Folliculitis 01/27/2015  . Heart murmur   . HTN (hypertension)   . Hypercholesteremia 10/09/2016  . Migraines   . Multiple allergies   . Obesity 10/14/2013  . Vaginal itching 12/30/2014    Past Surgical History:  Procedure Laterality Date  . ANTERIOR CRUCIATE LIGAMENT REPAIR Left 01/29/2017  . BREAST LUMPECTOMY Right    fibroadenoma  . DILATION AND CURETTAGE OF UTERUS    . KNEE ARTHROSCOPY      There were no vitals filed for this visit.      Subjective Assessment - 03/01/17 1302    Subjective Pt stated that she is feeling pretty good today. Denies pain   Currently in Pain? No/denies   Pain Score 0-No pain   Pain Location Knee                         [facility] Adult PT Treatment/Exercise - 03/01/17 0001      Knee/Hip  Exercises: Standing   Heel Raises Both;1 set;20 reps   Heel Raises Limitations heel and toe    Knee Flexion Both;20 reps   Terminal Knee Extension Limitations 20x 5" holds with BTB   Functional Squat 20 reps   Functional Squat Limitations very mini squats (0 to about 20 deg, but per protocol can go to 40 deg)     Knee/Hip Exercises: Seated   Long Arc Quad Left;15 reps   Long Arc Quad Limitations 90 to 45 deg     Knee/Hip Exercises: Supine   Quad Sets 15 reps   Quad Sets Limitations towel roll extra tactile cueing   Heel Slides 15 reps;Left   Straight Leg Raises 2 sets;Left;10 reps  fatigue at 10 reps   Straight Leg Raises Limitations quad set prior SLR, quad limited by fatigue minimal lag noted   Knee Extension Limitations 3   Knee Flexion Limitations 93  per protocol 0-100     Manual Therapy   Manual Therapy Passive ROM   Passive ROM PROM knee flexion no > than 90 seated EOB, knee extension                  PT Short Term Goals - 02/01/17 1229      PT  SHORT TERM GOAL #1   Title Pt will be independent with HEP and perform consistently in order to maximize return to PLOF.   Time 3   Period Weeks   Target Date 02/22/17     PT SHORT TERM GOAL #2   Title Pt will have improved L knee AROM to 5-100 deg to decrease pain and maximize overall function.   Time 3   Period Weeks   Status New     PT SHORT TERM GOAL #3   Title Pt will have improved MMT of hip musculature to 5/5 to maximize gait.    Time 3   Period Weeks   Status New     PT SHORT TERM GOAL #4   Title Pt will have decreased swelling to at least 15" on the L at joint line to decrease pain and maximize ROM.   Time 3   Period Weeks   Status New           PT Long Term Goals - 02/01/17 1232      PT LONG TERM GOAL #1   Title Pt will have improved L knee AROM to at least 0-120 deg to demo improved overall function and maximize sitting tolerance, gait, and stair ambulation   Time 6   Period Weeks    Status New   Target Date 03/15/17     PT LONG TERM GOAL #2   Title Pt will have at least 4/5 L knee MMT to maximize gait and return to PLOF.   Time 6   Period Weeks   Status New     PT LONG TERM GOAL #3   Title Pt will be able to perform SLS for at least 10 sec on LLE to demo improved function and maximize gait.    Baseline (once she is able to fully bear weight through LLE)   Time 6   Period Weeks   Status New     PT LONG TERM GOAL #4   Title Other long term goals will be updated once pt's WB precautions are lifted and pt is able to be out of her immobilizer and further objective testing can be performed. Jac Canavan, PT, DPT               Plan - 03/01/17 1402    Clinical Impression Statement Session focused on knee mobility and strengthening per ACL protocol. Pt noted a "good stretch" today with seated edge of table PROM to Left knee which patient felt really improved her mobility today. She noted that heel slides were easier to complete today and she didn't feel she needed to utilize her UE to assist. Able to get to 90 degrees without tension or reports of discomfort with active knee flexion. EOS she had no increase in symptoms and felt her knee was more lose compared to when she first came in. She continues to make good progress.    Rehab Potential Good   PT Frequency 3x / week   PT Duration 6 weeks   PT Treatment/Interventions ADLs/Self Care Home Management;Cryotherapy;Electrical Stimulation;Moist Heat;Ultrasound;DME Instruction;Gait training;Stair training;Functional mobility training;Therapeutic activities;Therapeutic exercise;Balance training;Neuromuscular re-education;Patient/family education;Manual techniques;Wheelchair mobility training;Passive range of motion;Scar mobilization;Dry needling;Energy conservation;Taping   PT Next Visit Plan Follow [facility] Orthopedic ACL Allograft Reconstruction Protocol; (further objective testing should be performed once pt is FWB on  LLE). Do not perform SAQ as it puts increased strain on the ACL in NWB. WBAT, may continue with estim but caution with [place] setting; continue WB  exercises per protocol; trial 2-4" step forward step up next session if tolerated. Continue PROM edge of table    PT Home Exercise Plan eval: supine calf stretch with rope, heel slides, quad sets; 9/19: 4-way SLR with brace      Patient will benefit from skilled therapeutic intervention in order to improve the following deficits and impairments:  Abnormal gait, Decreased activity tolerance, Decreased balance, Decreased endurance, Decreased mobility, Decreased range of motion, Decreased strength, Difficulty walking, Hypomobility, Increased edema, Increased muscle spasms, Increased fascial restricitons, Impaired flexibility, Pain  Visit Diagnosis: Acute pain of right knee  Stiffness of left knee  Localized edema  Muscle weakness (generalized)  Difficulty in walking, not elsewhere classified     Problem List Patient Active Problem List   Diagnosis Date Noted  . Hypercholesteremia 10/09/2016  . Immunization refused 09/11/2016  . Intermittent asthma without complication 09/11/2016  . Well woman exam with routine gynecological exam 01/13/2016  . Contraceptive management 03/31/2014  . Class 2 obesity due to excess calories without serious comorbidity with body mass index (BMI) of 36.0 to 36.9 in adult 10/14/2013  . Migraines   . HTN (hypertension)   . Multiple allergies    Candise Che PT, DPT 2:34 PM, 03/01/17 [phone]  [facility] [address] [place], [place], [address] Phone: [phone]   Fax:  [phone]  Name: Deanna Friedman MRN: [id number] Date of Birth: 1975-06-12

## 2017-03-04 ENCOUNTER — Ambulatory Visit (HOSPITAL_COMMUNITY): Payer: BC Managed Care – PPO | Admitting: Physical Therapy

## 2017-03-04 DIAGNOSIS — R600 Localized edema: Secondary | ICD-10-CM

## 2017-03-04 DIAGNOSIS — M25561 Pain in right knee: Secondary | ICD-10-CM

## 2017-03-04 DIAGNOSIS — M25662 Stiffness of left knee, not elsewhere classified: Secondary | ICD-10-CM

## 2017-03-04 DIAGNOSIS — R262 Difficulty in walking, not elsewhere classified: Secondary | ICD-10-CM

## 2017-03-04 DIAGNOSIS — M6281 Muscle weakness (generalized): Secondary | ICD-10-CM

## 2017-03-04 NOTE — Therapy (Signed)
[facility] [address] [place], [place], [address] Phone: [phone]   Fax:  [phone]  Physical Therapy Treatment  Patient Details  Name: Deanna Friedman MRN: [id number] Date of Birth: 10-25-1975 Referring Provider: Arlan Organ, MD  Encounter Date: 03/04/2017      PT End of Session - 03/04/17 1035    Visit Number 14   Number of Visits 19   Date for PT Re-Evaluation 03/15/17   Authorization Type [employer]   Authorization Time Period 02/01/17 to 03/15/17   PT Start Time 1038   PT Stop Time 1118   PT Time Calculation (min) 40 min   Equipment Utilized During Treatment Left knee immobilizer   Activity Tolerance Patient tolerated treatment well;No increased pain   Behavior During Therapy WFL for tasks assessed/performed      Past Medical History:  Diagnosis Date  . ADHD (attention deficit hyperactivity disorder)   . Allergy    food, meds, mold, dust  . Anemia   . Asthma   . Blood transfusion without reported diagnosis    childbirth  . Body aches 11/29/2014  . BV (bacterial vaginosis) 10/14/2013  . Contraceptive management 03/31/2014  . Decreased sex drive 6/96/2952  . Folliculitis 01/27/2015  . Heart murmur   . HTN (hypertension)   . Hypercholesteremia 10/09/2016  . Migraines   . Multiple allergies   . Obesity 10/14/2013  . Vaginal itching 12/30/2014    Past Surgical History:  Procedure Laterality Date  . ANTERIOR CRUCIATE LIGAMENT REPAIR Left 01/29/2017  . BREAST LUMPECTOMY Right    fibroadenoma  . DILATION AND CURETTAGE OF UTERUS    . KNEE ARTHROSCOPY      There were no vitals filed for this visit.      Subjective Assessment - 03/04/17 1026    Subjective Pt is continuing to complete her exercises at home   Currently in Pain? No/denies                         [facility] Adult PT Treatment/Exercise - 03/04/17 0001      Knee/Hip Exercises: Standing   Heel Raises 10 reps   Heel Raises  Limitations heel and toe    Knee Flexion 15 reps   Forward Step Up 10 reps;Step Height: 2"   Functional Squat 15 reps   Functional Squat Limitations normal squat    SLS B x 30"   Other Standing Knee Exercises lateral step over cone   Other Standing Knee Exercises gait with brace unlocked to promote knee flexion      Knee/Hip Exercises: Seated   Other Seated Knee/Hip Exercises isometric for extension at 90 and 60 x 10      Knee/Hip Exercises: Supine   Quad Sets 10 reps   Heel Slides 15 reps;Left   Straight Leg Raises 10 reps   Knee Extension Limitations 0   Knee Flexion Limitations 95  PROM98 Protocol to 105      Manual Therapy   Manual Therapy Passive ROM   Passive ROM per protocol to 105                   PT Short Term Goals - 02/01/17 1229      PT SHORT TERM GOAL #1   Title Pt will be independent with HEP and perform consistently in order to maximize return to PLOF.   Time 3   Period Weeks   Target Date 02/22/17  PT SHORT TERM GOAL #2   Title Pt will have improved L knee AROM to 5-100 deg to decrease pain and maximize overall function.   Time 3   Period Weeks   Status New     PT SHORT TERM GOAL #3   Title Pt will have improved MMT of hip musculature to 5/5 to maximize gait.    Time 3   Period Weeks   Status New     PT SHORT TERM GOAL #4   Title Pt will have decreased swelling to at least 15" on the L at joint line to decrease pain and maximize ROM.   Time 3   Period Weeks   Status New           PT Long Term Goals - 02/01/17 1232      PT LONG TERM GOAL #1   Title Pt will have improved L knee AROM to at least 0-120 deg to demo improved overall function and maximize sitting tolerance, gait, and stair ambulation   Time 6   Period Weeks   Status New   Target Date 03/15/17     PT LONG TERM GOAL #2   Title Pt will have at least 4/5 L knee MMT to maximize gait and return to PLOF.   Time 6   Period Weeks   Status New     PT LONG TERM GOAL  #3   Title Pt will be able to perform SLS for at least 10 sec on LLE to demo improved function and maximize gait.    Baseline (once she is able to fully bear weight through LLE)   Time 6   Period Weeks   Status New     PT LONG TERM GOAL #4   Title Other long term goals will be updated once pt's WB precautions are lifted and pt is able to be out of her immobilizer and further objective testing can be performed. Jac Canavan, PT, DPT               Plan - 03/04/17 1120    Clinical Impression Statement Pt has improved motion.  Able to complete 10SLR today without extension lag therefore allowed 30 degrees of flexion in knee brace.  Added step ups without difficulty.    Rehab Potential Good   PT Frequency 3x / week   PT Duration 6 weeks   PT Treatment/Interventions ADLs/Self Care Home Management;Cryotherapy;Electrical Stimulation;Moist Heat;Ultrasound;DME Instruction;Gait training;Stair training;Functional mobility training;Therapeutic activities;Therapeutic exercise;Balance training;Neuromuscular re-education;Patient/family education;Manual techniques;Wheelchair mobility training;Passive range of motion;Scar mobilization;Dry needling;Energy conservation;Taping   PT Next Visit Plan Follow [facility] Orthopedic ACL Allograft Reconstruction Protocol; (further objective testing should be performed once pt is FWB on LLE).WBAT, may continue with estim but caution with [place] setting; continue WB exercises per protocol; attempt to increase to 4" step up and step downs; assess whether flexion on knee brace can be increased to 40 degrees . Continue PROM edge of table    PT Home Exercise Plan eval: supine calf stretch with rope, heel slides, quad sets; 9/19: 4-way SLR with brace      Patient will benefit from skilled therapeutic intervention in order to improve the following deficits and impairments:  Abnormal gait, Decreased activity tolerance, Decreased balance, Decreased endurance, Decreased  mobility, Decreased range of motion, Decreased strength, Difficulty walking, Hypomobility, Increased edema, Increased muscle spasms, Increased fascial restricitons, Impaired flexibility, Pain  Visit Diagnosis: Acute pain of right knee  Stiffness of left knee  Muscle weakness (generalized)  Localized  edema  Difficulty in walking, not elsewhere classified     Problem List Patient Active Problem List   Diagnosis Date Noted  . Hypercholesteremia 10/09/2016  . Immunization refused 09/11/2016  . Intermittent asthma without complication 09/11/2016  . Well woman exam with routine gynecological exam 01/13/2016  . Contraceptive management 03/31/2014  . Class 2 obesity due to excess calories without serious comorbidity with body mass index (BMI) of 36.0 to 36.9 in adult 10/14/2013  . Migraines   . HTN (hypertension)   . Multiple allergies     Virgina Organ, [place] CLT [phone] 03/04/2017, 11:23 AM  [facility] [address] [place], [place], [address] Phone: [phone]   Fax:  [phone]  Name: Deanna Friedman MRN: [id number] Date of Birth: 20-Oct-1975

## 2017-03-06 ENCOUNTER — Encounter (HOSPITAL_COMMUNITY): Payer: Self-pay

## 2017-03-06 ENCOUNTER — Ambulatory Visit (HOSPITAL_COMMUNITY): Payer: BC Managed Care – PPO

## 2017-03-06 DIAGNOSIS — R600 Localized edema: Secondary | ICD-10-CM

## 2017-03-06 DIAGNOSIS — M25561 Pain in right knee: Secondary | ICD-10-CM

## 2017-03-06 DIAGNOSIS — R262 Difficulty in walking, not elsewhere classified: Secondary | ICD-10-CM

## 2017-03-06 DIAGNOSIS — M6281 Muscle weakness (generalized): Secondary | ICD-10-CM

## 2017-03-06 DIAGNOSIS — M25662 Stiffness of left knee, not elsewhere classified: Secondary | ICD-10-CM

## 2017-03-06 NOTE — Therapy (Signed)
[facility] [address] [place], [place], [address] Phone: [phone]   Fax:  [phone]  Physical Therapy Treatment  Patient Details  Name: Margaretmary BayleyJodi H Mclure MRN: [id number] Date of Birth: 02-Jul-1975 Referring Provider: Arlan Organobert A Collins, MD  Encounter Date: 03/06/2017      PT End of Session - 03/06/17 1602    Visit Number 15   Number of Visits 19   Date for PT Re-Evaluation 03/15/17   Authorization Type [employer]   Authorization Time Period 02/01/17 to 03/15/17   PT Start Time 1558   PT Stop Time 1650  last 5 min on bike, not included with charges   PT Time Calculation (min) 52 min   Equipment Utilized During Treatment Left knee immobilizer   Activity Tolerance Patient tolerated treatment well;No increased pain   Behavior During Therapy WFL for tasks assessed/performed      Past Medical History:  Diagnosis Date  . ADHD (attention deficit hyperactivity disorder)   . Allergy    food, meds, mold, dust  . Anemia   . Asthma   . Blood transfusion without reported diagnosis    childbirth  . Body aches 11/29/2014  . BV (bacterial vaginosis) 10/14/2013  . Contraceptive management 03/31/2014  . Decreased sex drive 6/96/29528/03/2015  . Folliculitis 01/27/2015  . Heart murmur   . HTN (hypertension)   . Hypercholesteremia 10/09/2016  . Migraines   . Multiple allergies   . Obesity 10/14/2013  . Vaginal itching 12/30/2014    Past Surgical History:  Procedure Laterality Date  . ANTERIOR CRUCIATE LIGAMENT REPAIR Left 01/29/2017  . BREAST LUMPECTOMY Right    fibroadenoma  . DILATION AND CURETTAGE OF UTERUS    . KNEE ARTHROSCOPY      There were no vitals filed for this visit.      Subjective Assessment - 03/06/17 1559    Subjective Pt stated she has been on feet a lot the last 2 days with increased pain, pain scale 5/10.  Has been completeing exercises daily.     Patient Stated Goals get back mobility to where I was before   Currently in Pain? Yes   Pain Score 5    Pain Location Knee   Pain Orientation Left   Pain Descriptors / Indicators Sore;Dull   Pain Type Surgical pain   Pain Onset 1 to 4 weeks ago   Pain Frequency Intermittent   Aggravating Factors  being up on it for a long time, brace being too tight   Pain Relieving Factors ice, rest, elevation, meds   Effect of Pain on Daily Activities increases                         [facility] Adult PT Treatment/Exercise - 03/06/17 0001      Knee/Hip Exercises: Aerobic   Stationary Bike 5' rocking seat 8     Knee/Hip Exercises: Standing   Heel Raises 20 reps   Heel Raises Limitations heel and toe    Knee Flexion 15 reps   Lateral Step Up 15 reps;Left;Hand Hold: 1;Step Height: 4"   Forward Step Up 15 reps;Left;Hand Hold: 1;Step Height: 4"   Functional Squat 15 reps   Functional Squat Limitations normal squat with brace limited to 30 degrees   SLS Lt 60" first attempt     Knee/Hip Exercises: Seated   Heel Slides 10 reps     Knee/Hip Exercises: Supine   Quad Sets 15 reps  Straight Leg Raises 15 reps   Straight Leg Raises Limitations quad set prior SLR, no extension lag noted   Knee Extension PROM;2 sets   Knee Extension Limitations 0   Knee Flexion Limitations 103     Manual Therapy   Manual Therapy Passive ROM;Edema management   Manual therapy comments completed separate rest of treatment   Edema Management retrograde massage with LE elevated with ankle pumps    Passive ROM per protocol to 105                 PT Education - 03/06/17 1632    Education provided Yes   Education Details Reviewed protocol.  Given signed referral for cryocuff/game ready   Person(s) Educated Patient   Methods Explanation;Handout   Comprehension Verbalized understanding          PT Short Term Goals - 02/01/17 1229      PT SHORT TERM GOAL #1   Title Pt will be independent with HEP and perform consistently in order to maximize return to  PLOF.   Time 3   Period Weeks   Target Date 02/22/17     PT SHORT TERM GOAL #2   Title Pt will have improved L knee AROM to 5-100 deg to decrease pain and maximize overall function.   Time 3   Period Weeks   Status New     PT SHORT TERM GOAL #3   Title Pt will have improved MMT of hip musculature to 5/5 to maximize gait.    Time 3   Period Weeks   Status New     PT SHORT TERM GOAL #4   Title Pt will have decreased swelling to at least 15" on the L at joint line to decrease pain and maximize ROM.   Time 3   Period Weeks   Status New           PT Long Term Goals - 02/01/17 1232      PT LONG TERM GOAL #1   Title Pt will have improved L knee AROM to at least 0-120 deg to demo improved overall function and maximize sitting tolerance, gait, and stair ambulation   Time 6   Period Weeks   Status New   Target Date 03/15/17     PT LONG TERM GOAL #2   Title Pt will have at least 4/5 L knee MMT to maximize gait and return to PLOF.   Time 6   Period Weeks   Status New     PT LONG TERM GOAL #3   Title Pt will be able to perform SLS for at least 10 sec on LLE to demo improved function and maximize gait.    Baseline (once she is able to fully bear weight through LLE)   Time 6   Period Weeks   Status New     PT LONG TERM GOAL #4   Title Other long term goals will be updated once pt's WB precautions are lifted and pt is able to be out of her immobilizer and further objective testing can be performed. Jac Canavan, PT, DPT               Plan - 03/06/17 1614    Clinical Impression Statement Pt at 5 week post op.  Received physician signed order for cyrocuff/game ready, given to pt and instructed places for purchase.  Pt with edema present proximal knee limiting mobilty, retrograde massage complete to assist with 10 degrees improvements noted.  AROM 0-103 degrees following manual with PROM to 105 degrees.  Began rocking on bike for mobility (seat 8) and progressed CKC with  lateral and increased height with forward step ups.  No reports of pain through session.  Noted visible quad fatigue with activities.     Rehab Potential Good   PT Frequency 3x / week   PT Duration 6 weeks   PT Treatment/Interventions ADLs/Self Care Home Management;Cryotherapy;Electrical Stimulation;Moist Heat;Ultrasound;DME Instruction;Gait training;Stair training;Functional mobility training;Therapeutic activities;Therapeutic exercise;Balance training;Neuromuscular re-education;Patient/family education;Manual techniques;Wheelchair mobility training;Passive range of motion;Scar mobilization;Dry needling;Energy conservation;Taping   PT Next Visit Plan Follow [facility] Orthopedic ACL Allograft Reconstruction Protocol; Attempt step downs; assess whether flexion on knee brace can be increased to 40 degrees . Continue PROM edge of table    PT Home Exercise Plan eval: supine calf stretch with rope, heel slides, quad sets; 9/19: 4-way SLR with brace      Patient will benefit from skilled therapeutic intervention in order to improve the following deficits and impairments:  Abnormal gait, Decreased activity tolerance, Decreased balance, Decreased endurance, Decreased mobility, Decreased range of motion, Decreased strength, Difficulty walking, Hypomobility, Increased edema, Increased muscle spasms, Increased fascial restricitons, Impaired flexibility, Pain  Visit Diagnosis: Acute pain of right knee  Stiffness of left knee  Muscle weakness (generalized)  Localized edema  Difficulty in walking, not elsewhere classified     Problem List Patient Active Problem List   Diagnosis Date Noted  . Hypercholesteremia 10/09/2016  . Immunization refused 09/11/2016  . Intermittent asthma without complication 09/11/2016  . Well woman exam with routine gynecological exam 01/13/2016  . Contraceptive management 03/31/2014  . Class 2 obesity due to excess calories without serious comorbidity with body mass  index (BMI) of 36.0 to 36.9 in adult 10/14/2013  . Migraines   . HTN (hypertension)   . Multiple allergies    Becky Sax, [place]; CBIS [phone]  Juel Burrow 03/06/2017, 6:35 PM  [facility] [address] [place], [place], [address] Phone: [phone]   Fax:  [phone]  Name: ELDA DUNKERSON MRN: [id number] Date of Birth: 14-Jan-1976

## 2017-03-08 ENCOUNTER — Encounter (HOSPITAL_COMMUNITY): Payer: Self-pay

## 2017-03-08 ENCOUNTER — Ambulatory Visit (HOSPITAL_COMMUNITY): Payer: BC Managed Care – PPO

## 2017-03-08 DIAGNOSIS — R600 Localized edema: Secondary | ICD-10-CM

## 2017-03-08 DIAGNOSIS — M25662 Stiffness of left knee, not elsewhere classified: Secondary | ICD-10-CM

## 2017-03-08 DIAGNOSIS — M25561 Pain in right knee: Secondary | ICD-10-CM

## 2017-03-08 DIAGNOSIS — M6281 Muscle weakness (generalized): Secondary | ICD-10-CM

## 2017-03-08 DIAGNOSIS — R262 Difficulty in walking, not elsewhere classified: Secondary | ICD-10-CM

## 2017-03-08 NOTE — Therapy (Signed)
[facility] [address] [place], [place], [address] Phone: [phone]   Fax:  [phone]  Physical Therapy Treatment  Patient Details  Name: Deanna Friedman MRN: [id number] Date of Birth: [birth year] Referring Provider: Arlan Organ, MD  Encounter Date: 03/08/2017      PT End of Session - 03/08/17 1611    Visit Number 16   Number of Visits 19   Date for PT Re-Evaluation 03/15/17   Authorization Type [employer]   Authorization Time Period 02/01/17 to 03/15/17   PT Start Time 1605   PT Stop Time 1652  last 5 min on bike, no charge   PT Time Calculation (min) 47 min   Equipment Utilized During Treatment Left knee immobilizer   Activity Tolerance Patient tolerated treatment well;No increased pain   Behavior During Therapy WFL for tasks assessed/performed      Past Medical History:  Diagnosis Date  . ADHD (attention deficit hyperactivity disorder)   . Allergy    food, meds, mold, dust  . Anemia   . Asthma   . Blood transfusion without reported diagnosis    childbirth  . Body aches 11/29/2014  . BV (bacterial vaginosis) 10/14/2013  . Contraceptive management 03/31/2014  . Decreased sex drive 1/32/4401  . Folliculitis 01/27/2015  . Heart murmur   . HTN (hypertension)   . Hypercholesteremia 10/09/2016  . Migraines   . Multiple allergies   . Obesity 10/14/2013  . Vaginal itching 12/30/2014    Past Surgical History:  Procedure Laterality Date  . ANTERIOR CRUCIATE LIGAMENT REPAIR Left 01/29/2017  . BREAST LUMPECTOMY Right    fibroadenoma  . DILATION AND CURETTAGE OF UTERUS    . KNEE ARTHROSCOPY      There were no vitals filed for this visit.      Subjective Assessment - 03/08/17 1609    Subjective Pt stated she went for walk probably 1.5 miles really working on proper gait mechanics, feels it helped her flexibilty.     Patient Stated Goals get back mobility to where I was before   Currently in Pain? Yes   Pain  Score 2    Pain Location Knee   Pain Orientation Left   Pain Descriptors / Indicators Tightness   Pain Type Surgical pain   Pain Onset 1 to 4 weeks ago   Pain Frequency Intermittent   Aggravating Factors  being up on it for a long time, brace being too tight   Pain Relieving Factors ice, rest, elevation, meds   Effect of Pain on Daily Activities increases            [facility] PT Assessment - 03/08/17 0001      Assessment   Medical Diagnosis L ACL Allograft   Referring Provider Arlan Organ, MD   Onset Date/Surgical Date 01/29/17   Next MD Visit 10/29/108   Prior Therapy arthoscopic on R knee a long time ago     Precautions   Precautions Knee   Precaution Comments follow [facility] ACL Allograft Reconstruction Protocol   Required Braces or Orthoses Knee Immobilizer - Left   Knee Immobilizer - Left On when out of bed or walking;Other (comment)     Observation/Other Assessments-Edema    Edema Circumferential     Circumferential Edema   Circumferential - Right 36 cm joint line   Circumferential - Left  38.5cm joint line  [facility] Adult PT Treatment/Exercise - 03/08/17 0001      Knee/Hip Exercises: Aerobic   Stationary Bike 5' rocking seat 8     Knee/Hip Exercises: Standing   Heel Raises 20 reps   Heel Raises Limitations heel and toe    Knee Flexion 15 reps   Terminal Knee Extension Limitations 20x 5" holds with BTB   Lateral Step Up 15 reps;Left;Hand Hold: 1;Step Height: 4"   Forward Step Up 15 reps;Left;Hand Hold: 1;Step Height: 4"   Functional Squat 15 reps   Functional Squat Limitations normal squat with brace limited to 30 degrees   Rocker Board 2 minutes   Rocker Board Limitations R/L     Knee/Hip Exercises: Seated   Heel Slides 10 reps     Knee/Hip Exercises: Supine   Quad Sets 15 reps   Straight Leg Raises 15 reps   Straight Leg Raises Limitations quad set prior SLR, noted slight extension lag this session    Knee Extension Limitations 0   Knee Flexion Limitations 106     Manual Therapy   Manual Therapy Edema management;Passive ROM;Soft tissue mobilization   Manual therapy comments completed separate rest of treatment   Edema Management retrograde massage with LE elevated with ankle pumps    Joint Mobilization patella mobs all directions   Soft tissue mobilization distal quad   Passive ROM PROM 106                  PT Short Term Goals - 02/01/17 1229      PT SHORT TERM GOAL #1   Title Pt will be independent with HEP and perform consistently in order to maximize return to PLOF.   Time 3   Period Weeks   Target Date 02/22/17     PT SHORT TERM GOAL #2   Title Pt will have improved L knee AROM to 5-100 deg to decrease pain and maximize overall function.   Time 3   Period Weeks   Status New     PT SHORT TERM GOAL #3   Title Pt will have improved MMT of hip musculature to 5/5 to maximize gait.    Time 3   Period Weeks   Status New     PT SHORT TERM GOAL #4   Title Pt will have decreased swelling to at least 15" on the L at joint line to decrease pain and maximize ROM.   Time 3   Period Weeks   Status New           PT Long Term Goals - 02/01/17 1232      PT LONG TERM GOAL #1   Title Pt will have improved L knee AROM to at least 0-120 deg to demo improved overall function and maximize sitting tolerance, gait, and stair ambulation   Time 6   Period Weeks   Status New   Target Date 03/15/17     PT LONG TERM GOAL #2   Title Pt will have at least 4/5 L knee MMT to maximize gait and return to PLOF.   Time 6   Period Weeks   Status New     PT LONG TERM GOAL #3   Title Pt will be able to perform SLS for at least 10 sec on LLE to demo improved function and maximize gait.    Baseline (once she is able to fully bear weight through LLE)   Time 6   Period Weeks   Status New  PT LONG TERM GOAL #4   Title Other long term goals will be updated once pt's WB  precautions are lifted and pt is able to be out of her immobilizer and further objective testing can be performed. Jac Canavan, PT, DPT               Plan - 03/08/17 1747    Clinical Impression Statement Pt at 5 wek post-op.  Pt reports she walked 1.5 miles yesterday and increased fatigue at entrance.  Edema continues to be present proximal knee and tight distal quad, began session with manual retrograde massage and STM to address quad spasm.  AROM 0-106 degrees following manual and therex.  Pt unable to complete SLR wihtout extension lag this session, believed due to fatigue from increased weight bearing yesterday and today.  No reports of increased pain through session.     Rehab Potential Good   PT Frequency 3x / week   PT Duration 6 weeks   PT Treatment/Interventions ADLs/Self Care Home Management;Cryotherapy;Electrical Stimulation;Moist Heat;Ultrasound;DME Instruction;Gait training;Stair training;Functional mobility training;Therapeutic activities;Therapeutic exercise;Balance training;Neuromuscular re-education;Patient/family education;Manual techniques;Wheelchair mobility training;Passive range of motion;Scar mobilization;Dry needling;Energy conservation;Taping   PT Next Visit Plan Follow [facility] Orthopedic ACL Allograft Reconstruction Protocol; Attempt front step downs; assess whether flexion on knee brace can be increased to 40 degrees . Continue PROM edge of table    PT Home Exercise Plan eval: supine calf stretch with rope, heel slides, quad sets; 9/19: 4-way SLR with brace      Patient will benefit from skilled therapeutic intervention in order to improve the following deficits and impairments:  Abnormal gait, Decreased activity tolerance, Decreased balance, Decreased endurance, Decreased mobility, Decreased range of motion, Decreased strength, Difficulty walking, Hypomobility, Increased edema, Increased muscle spasms, Increased fascial restricitons, Impaired flexibility,  Pain  Visit Diagnosis: Acute pain of right knee  Stiffness of left knee  Muscle weakness (generalized)  Localized edema  Difficulty in walking, not elsewhere classified     Problem List Patient Active Problem List   Diagnosis Date Noted  . Hypercholesteremia 10/09/2016  . Immunization refused 09/11/2016  . Intermittent asthma without complication 09/11/2016  . Well woman exam with routine gynecological exam 01/13/2016  . Contraceptive management 03/31/2014  . Class 2 obesity due to excess calories without serious comorbidity with body mass index (BMI) of 36.0 to 36.9 in adult 10/14/2013  . Migraines   . HTN (hypertension)   . Multiple allergies     Juel Burrow 03/08/2017, 6:43 PM  [facility] [address] [place], [place], [address] Phone: [phone]   Fax:  [phone]  Name: DONETTE MAINWARING MRN: [id number] Date of Birth: Oct 03, 1975

## 2017-03-11 ENCOUNTER — Other Ambulatory Visit: Payer: Self-pay | Admitting: Adult Health

## 2017-03-11 ENCOUNTER — Encounter (HOSPITAL_COMMUNITY): Payer: Self-pay | Admitting: Physical Therapy

## 2017-03-11 ENCOUNTER — Ambulatory Visit (HOSPITAL_COMMUNITY): Payer: BC Managed Care – PPO | Admitting: Physical Therapy

## 2017-03-11 DIAGNOSIS — R600 Localized edema: Secondary | ICD-10-CM

## 2017-03-11 DIAGNOSIS — R262 Difficulty in walking, not elsewhere classified: Secondary | ICD-10-CM

## 2017-03-11 DIAGNOSIS — M6281 Muscle weakness (generalized): Secondary | ICD-10-CM

## 2017-03-11 DIAGNOSIS — M25662 Stiffness of left knee, not elsewhere classified: Secondary | ICD-10-CM

## 2017-03-11 DIAGNOSIS — M25561 Pain in right knee: Secondary | ICD-10-CM | POA: Diagnosis not present

## 2017-03-11 NOTE — Therapy (Signed)
[facility] [address] [place], [place], [address] Phone: [phone]   Fax:  [phone]  Physical Therapy Treatment  Patient Details  Name: Deanna Friedman MRN: [id number] Date of Birth: 1975-11-01 Referring Provider: Arlan Organ, MD  Encounter Date: 03/11/2017      PT End of Session - 03/11/17 1709    Visit Number 17   Number of Visits 19   Date for PT Re-Evaluation 03/15/17   Authorization Type [employer]   Authorization Time Period 02/01/17 to 03/15/17   PT Start Time 1625   PT Stop Time 1703   PT Time Calculation (min) 38 min   Equipment Utilized During Treatment Left knee immobilizer   Activity Tolerance Patient tolerated treatment well   Behavior During Therapy [facility] for tasks assessed/performed      Past Medical History:  Diagnosis Date  . ADHD (attention deficit hyperactivity disorder)   . Allergy    food, meds, mold, dust  . Anemia   . Asthma   . Blood transfusion without reported diagnosis    childbirth  . Body aches 11/29/2014  . BV (bacterial vaginosis) 10/14/2013  . Contraceptive management 03/31/2014  . Decreased sex drive 6/96/2952  . Folliculitis 01/27/2015  . Heart murmur   . HTN (hypertension)   . Hypercholesteremia 10/09/2016  . Migraines   . Multiple allergies   . Obesity 10/14/2013  . Vaginal itching 12/30/2014    Past Surgical History:  Procedure Laterality Date  . ANTERIOR CRUCIATE LIGAMENT REPAIR Left 01/29/2017  . BREAST LUMPECTOMY Right    fibroadenoma  . DILATION AND CURETTAGE OF UTERUS    . KNEE ARTHROSCOPY      There were no vitals filed for this visit.      Subjective Assessment - 03/11/17 1627    Subjective Patient arrives complaining of cramping at distal quad, she is not sure how to work this out   Patient Stated Goals get back mobility to where I was before   Currently in Pain? Yes   Pain Score 1    Pain Location Knee   Pain Orientation Left                          [facility] Adult PT Treatment/Exercise - 03/11/17 0001      Knee/Hip Exercises: Standing   Heel Raises 20 reps   Heel Raises Limitations heel and toe   on incline    Lateral Step Up Left;1 set;20 reps   Lateral Step Up Limitations 4 inch box    Forward Step Up Left;1 set;20 reps   Forward Step Up Limitations 4 inch box    Step Down Left;1 set;10 reps   Step Down Limitations 4 inch box   2 sets    Functional Squat 20 reps   Functional Squat Limitations normal squat with brace limited to 30 degrees  2 sets    Other Standing Knee Exercises tandem gait forward and retro x3 laps      Knee/Hip Exercises: Supine   Straight Leg Raises 20 reps   Straight Leg Raises Limitations quad set prior SLR, noted slight extension lag this session      Manual: retrograde massage with LE elevated, patella mobility, distal quad STM L LE  (separate from all other skilled services)         PT Education - 03/11/17 1709    Education provided No  PT Short Term Goals - 02/01/17 1229      PT SHORT TERM GOAL #1   Title Pt will be independent with HEP and perform consistently in order to maximize return to PLOF.   Time 3   Period Weeks   Target Date 02/22/17     PT SHORT TERM GOAL #2   Title Pt will have improved L knee AROM to 5-100 deg to decrease pain and maximize overall function.   Time 3   Period Weeks   Status New     PT SHORT TERM GOAL #3   Title Pt will have improved MMT of hip musculature to 5/5 to maximize gait.    Time 3   Period Weeks   Status New     PT SHORT TERM GOAL #4   Title Pt will have decreased swelling to at least 15" on the L at joint line to decrease pain and maximize ROM.   Time 3   Period Weeks   Status New           PT Long Term Goals - 02/01/17 1232      PT LONG TERM GOAL #1   Title Pt will have improved L knee AROM to at least 0-120 deg to demo improved overall function and maximize sitting tolerance,  gait, and stair ambulation   Time 6   Period Weeks   Status New   Target Date 03/15/17     PT LONG TERM GOAL #2   Title Pt will have at least 4/5 L knee MMT to maximize gait and return to PLOF.   Time 6   Period Weeks   Status New     PT LONG TERM GOAL #3   Title Pt will be able to perform SLS for at least 10 sec on LLE to demo improved function and maximize gait.    Baseline (once she is able to fully bear weight through LLE)   Time 6   Period Weeks   Status New     PT LONG TERM GOAL #4   Title Other long term goals will be updated once pt's WB precautions are lifted and pt is able to be out of her immobilizer and further objective testing can be performed. Deanna Friedman, PT, DPT               Plan - 03/11/17 1709    Clinical Impression Statement Patient arrives reporting concerns of cramping distal quad; addressed this with aggressive STM to this area and also incorporated retrograde massage due to ongoing mild edema. Otherwise continued with functional exercise per protocol and as appropriate for patient at this time given ongoing quad lag however this seems to be slowly improving however she continues to demonstrate considerable strength deficits L LE.    Rehab Potential Good   PT Frequency 3x / week   PT Duration 6 weeks   PT Treatment/Interventions ADLs/Self Care Home Management;Cryotherapy;Electrical Stimulation;Moist Heat;Ultrasound;DME Instruction;Gait training;Stair training;Functional mobility training;Therapeutic activities;Therapeutic exercise;Balance training;Neuromuscular re-education;Patient/family education;Manual techniques;Wheelchair mobility training;Passive range of motion;Scar mobilization;Dry needling;Energy conservation;Taping   PT Next Visit Plan [facility] ACL Allograft Reconstruction Protocol; Attempt front step downs; assess whether flexion on knee brace can be increased to 40 degrees . Continue PROM edge of table    PT Home Exercise  Plan eval: supine calf stretch with rope, heel slides, quad sets; 9/19: 4-way SLR with brace   Consulted and Agree with Plan of Care Patient      Patient will  benefit from skilled therapeutic intervention in order to improve the following deficits and impairments:  Abnormal gait, Decreased activity tolerance, Decreased balance, Decreased endurance, Decreased mobility, Decreased range of motion, Decreased strength, Difficulty walking, Hypomobility, Increased edema, Increased muscle spasms, Increased fascial restricitons, Impaired flexibility, Pain  Visit Diagnosis: Acute pain of right knee  Stiffness of left knee  Muscle weakness (generalized)  Localized edema  Difficulty in walking, not elsewhere classified     Problem List Patient Active Problem List   Diagnosis Date Noted  . Hypercholesteremia 10/09/2016  . Immunization refused 09/11/2016  . Intermittent asthma without complication 09/11/2016  . Well woman exam with routine gynecological exam 01/13/2016  . Contraceptive management 03/31/2014  . Class 2 obesity due to excess calories without serious comorbidity with body mass index (BMI) of 36.0 to 36.9 in adult 10/14/2013  . Migraines   . HTN (hypertension)   . Multiple allergies     Nedra Hai PT, DPT [id number]  [facility] [address] [place], [place], [address] Phone: [phone]   Fax:  [phone]  Name: Deanna Friedman MRN: [id number] Date of Birth: 1975/08/29

## 2017-03-13 ENCOUNTER — Ambulatory Visit (HOSPITAL_COMMUNITY): Payer: BC Managed Care – PPO

## 2017-03-13 ENCOUNTER — Encounter (HOSPITAL_COMMUNITY): Payer: Self-pay

## 2017-03-13 DIAGNOSIS — M6281 Muscle weakness (generalized): Secondary | ICD-10-CM

## 2017-03-13 DIAGNOSIS — M25561 Pain in right knee: Secondary | ICD-10-CM

## 2017-03-13 DIAGNOSIS — M25662 Stiffness of left knee, not elsewhere classified: Secondary | ICD-10-CM

## 2017-03-13 DIAGNOSIS — R262 Difficulty in walking, not elsewhere classified: Secondary | ICD-10-CM

## 2017-03-13 DIAGNOSIS — R600 Localized edema: Secondary | ICD-10-CM

## 2017-03-13 NOTE — Patient Instructions (Signed)
Knee Extension: Terminal - Standing (Single Leg)    Face anchor in shoulder width stance, band around knee. Allow tension of band to slightly bend knee. Pull leg back, straightening knee. Repeat __ times per set. Repeat with other leg. Do __ sets per session. Do __ sessions per week. Anchor Height: Knee  [email or web address].[email or web address]   Copyright  VHI. All rights reserved.   Short Arc Quad    With bolster under knee of sound limb, tighten muscle on top of thigh and straighten leg. Maintain contact with bolster. Hold 3-5 seconds. Repeat 1-2 times. Copyright  VHI. All rights reserved.

## 2017-03-13 NOTE — Therapy (Signed)
[facility] [address] [place], [place], [address] Phone: [phone]   Fax:  [phone]  Physical Therapy Treatment  Patient Details  Name: Deanna Friedman MRN: [id number] Date of Birth: 1975-11-06 Referring Provider: Arlan Organ, MD  Encounter Date: 03/13/2017      PT End of Session - 03/13/17 1620    Visit Number 18   Number of Visits 19   Date for PT Re-Evaluation 03/15/17   Authorization Type [employer]   Authorization Time Period 02/01/17 to 03/15/17   PT Start Time 1604   PT Stop Time 1650  -5' at EOS, no charge for bike   PT Time Calculation (min) 46 min   Equipment Utilized During Treatment Left knee immobilizer   Activity Tolerance Patient tolerated treatment well;No increased pain   Behavior During Therapy WFL for tasks assessed/performed      Past Medical History:  Diagnosis Date  . ADHD (attention deficit hyperactivity disorder)   . Allergy    food, meds, mold, dust  . Anemia   . Asthma   . Blood transfusion without reported diagnosis    childbirth  . Body aches 11/29/2014  . BV (bacterial vaginosis) 10/14/2013  . Contraceptive management 03/31/2014  . Decreased sex drive 6/96/2952  . Folliculitis 01/27/2015  . Heart murmur   . HTN (hypertension)   . Hypercholesteremia 10/09/2016  . Migraines   . Multiple allergies   . Obesity 10/14/2013  . Vaginal itching 12/30/2014    Past Surgical History:  Procedure Laterality Date  . ANTERIOR CRUCIATE LIGAMENT REPAIR Left 01/29/2017  . BREAST LUMPECTOMY Right    fibroadenoma  . DILATION AND CURETTAGE OF UTERUS    . KNEE ARTHROSCOPY      There were no vitals filed for this visit.      Subjective Assessment - 03/13/17 1605    Subjective Pt arrived stated she continues to have cramping on distal quad, has applied more ice for edema control today.  No reports of pain    Patient Stated Goals get back mobility to where I was before   Currently in Pain?  No/denies                         [facility] Adult PT Treatment/Exercise - 03/13/17 0001      Knee/Hip Exercises: Aerobic   Stationary Bike 5' rocking (not included with billing)     Knee/Hip Exercises: Standing   Terminal Knee Extension Limitations 20x 5" holds with BTB; given for additional HEP   Lateral Step Up Left;1 set;20 reps   Lateral Step Up Limitations 4 inch box    Forward Step Up Left;1 set;20 reps   Forward Step Up Limitations 4 inch box    Step Down Left;1 set;10 reps   Step Down Limitations 4 inch box    Functional Squat 20 reps   Functional Squat Limitations normal squat with brace limited to 30 degrees     Knee/Hip Exercises: Supine   Short Arc Quad Sets Left;2 sets;10 reps   Short Arc Quad Sets Limitations 3" holds   Terminal Knee Extension 10 reps   Straight Leg Raises 20 reps   Straight Leg Raises Limitations quad set prior SLR, noted slight extension lag this session   Knee Extension Limitations 0   Knee Flexion Limitations 106     Manual Therapy   Manual Therapy Edema management;Passive ROM;Soft tissue mobilization   Manual therapy comments completed separate  rest of treatment   Edema Management retrograde massage with LE elevated with ankle pumps    Joint Mobilization patella mobs all directions; instructed patella mobs to pt, able to demonstrate appropriate technique/from   Soft tissue mobilization distal quad                  PT Short Term Goals - 02/01/17 1229      PT SHORT TERM GOAL #1   Title Pt will be independent with HEP and perform consistently in order to maximize return to PLOF.   Time 3   Period Weeks   Target Date 02/22/17     PT SHORT TERM GOAL #2   Title Pt will have improved L knee AROM to 5-100 deg to decrease pain and maximize overall function.   Time 3   Period Weeks   Status New     PT SHORT TERM GOAL #3   Title Pt will have improved MMT of hip musculature to 5/5 to maximize gait.    Time 3   Period  Weeks   Status New     PT SHORT TERM GOAL #4   Title Pt will have decreased swelling to at least 15" on the L at joint line to decrease pain and maximize ROM.   Time 3   Period Weeks   Status New           PT Long Term Goals - 02/01/17 1232      PT LONG TERM GOAL #1   Title Pt will have improved L knee AROM to at least 0-120 deg to demo improved overall function and maximize sitting tolerance, gait, and stair ambulation   Time 6   Period Weeks   Status New   Target Date 03/15/17     PT LONG TERM GOAL #2   Title Pt will have at least 4/5 L knee MMT to maximize gait and return to PLOF.   Time 6   Period Weeks   Status New     PT LONG TERM GOAL #3   Title Pt will be able to perform SLS for at least 10 sec on LLE to demo improved function and maximize gait.    Baseline (once she is able to fully bear weight through LLE)   Time 6   Period Weeks   Status New     PT LONG TERM GOAL #4   Title Other long term goals will be updated once pt's WB precautions are lifted and pt is able to be out of her immobilizer and further objective testing can be performed. Jac Canavan, PT, DPT               Plan - 03/13/17 1648    Clinical Impression Statement Pt arrived with noted increased edema present proximal knee and c/o cramping distal quad.  Reviewed ice application.  Began session with manual to address edema and soft tissue mobilization to address quad tightness.  Pt at 6 week post-op, continues to have extension lag with SLR.   Added SAQ (reviewed with evaluation PT prior) and TKE in supine to improve distal quad strengthening.  Pt given advanced HEP to address quad extension strengthening.  Pt instructed patella mobs and encouraged to complete at home to address knee mobility, able to demonstrate and verbalize appropriate form and technique.  No reports of pain through session.   Rehab Potential Good   PT Frequency 3x / week   PT Duration 6 weeks   PT  Treatment/Interventions  ADLs/Self Care Home Management;Cryotherapy;Electrical Stimulation;Moist Heat;Ultrasound;DME Instruction;Gait training;Stair training;Functional mobility training;Therapeutic activities;Therapeutic exercise;Balance training;Neuromuscular re-education;Patient/family education;Manual techniques;Wheelchair mobility training;Passive range of motion;Scar mobilization;Dry needling;Energy conservation;Taping   PT Next Visit Plan Reassess next session.  [facility] All[facility] Reconstruction Protocol; Continue quad strengthening to address extension lag.  Assess whether flexion on knee brace can be increased to 40 degrees . Continue PROM edge of table    PT Home Exercise Plan eval: supine calf stretch with rope, heel slides, quad sets; 9/19: 4-way SLR with brace; 10/24: standing TKE, supine SAQ and instructed patella mobs      Patient will benefit from skilled therapeutic intervention in order to improve the following deficits and impairments:  Abnormal gait, Decreased activity tolerance, Decreased balance, Decreased endurance, Decreased mobility, Decreased range of motion, Decreased strength, Difficulty walking, Hypomobility, Increased edema, Increased muscle spasms, Increased fascial restricitons, Impaired flexibility, Pain  Visit Diagnosis: Acute pain of right knee  Stiffness of left knee  Muscle weakness (generalized)  Localized edema  Difficulty in walking, not elsewhere classified     Problem List Patient Active Problem List   Diagnosis Date Noted  . Hypercholesteremia 10/09/2016  . Immunization refused 09/11/2016  . Intermittent asthma without complication 09/11/2016  . Well woman exam with routine gynecological exam 01/13/2016  . Contraceptive management 03/31/2014  . Class 2 obesity due to excess calories without serious comorbidity with body mass index (BMI) of 36.0 to 36.9 in adult 10/14/2013  . Migraines   . HTN (hypertension)   . Multiple allergies    Becky SaxCasey  Kazumi Lachney, [place]PTA; CBIS [phone]  Juel BurrowCockerham, Jenisha Faison Jo 03/13/2017, 4:57 PM  [facility] [address] [place], [place], [address] Phone: [phone]   Fax:  [phone]  Name: Deanna Friedman MRN: [id number] Date of Birth: 07-31-1975

## 2017-03-15 ENCOUNTER — Encounter (HOSPITAL_COMMUNITY): Payer: Self-pay

## 2017-03-15 ENCOUNTER — Ambulatory Visit (HOSPITAL_COMMUNITY): Payer: BC Managed Care – PPO

## 2017-03-15 DIAGNOSIS — M25561 Pain in right knee: Secondary | ICD-10-CM

## 2017-03-15 DIAGNOSIS — M25662 Stiffness of left knee, not elsewhere classified: Secondary | ICD-10-CM

## 2017-03-15 DIAGNOSIS — M6281 Muscle weakness (generalized): Secondary | ICD-10-CM

## 2017-03-15 DIAGNOSIS — R600 Localized edema: Secondary | ICD-10-CM

## 2017-03-15 DIAGNOSIS — R262 Difficulty in walking, not elsewhere classified: Secondary | ICD-10-CM

## 2017-03-15 NOTE — Therapy (Signed)
[place], [place], [address] Phone: [phone]   Fax:  [phone]  Physical Therapy Treatment/Reassessment  Patient Details  Name: Deanna Friedman MRN: [id number] Date of Birth: July 30, 1975 Referring Provider: Frederich Balding, MD  Encounter Date: 03/15/2017      PT End of Session - 03/15/17 0815    Visit Number 19   Number of Visits 37   Date for PT Re-Evaluation 03/15/17   Authorization Type [place] Time Period 03/15/17 to 04/26/17   PT Start Time 0815   PT Stop Time 0857   PT Time Calculation (min) 42 min   Equipment Utilized During Treatment Left knee immobilizer   Activity Tolerance Patient tolerated treatment well;No increased pain   Behavior During Therapy WFL for tasks assessed/performed      Past Medical History:  Diagnosis Date  . ADHD (attention deficit hyperactivity disorder)   . Allergy    food, meds, mold, dust  . Anemia   . Asthma   . Blood transfusion without reported diagnosis    childbirth  . Body aches 11/29/2014  . BV (bacterial vaginosis) 10/14/2013  . Contraceptive management 03/31/2014  . Decreased sex drive 6/62/9476  . Folliculitis 09/21/6501  . Heart murmur   . HTN (hypertension)   . Hypercholesteremia 10/09/2016  . Migraines   . Multiple allergies   . Obesity 10/14/2013  . Vaginal itching 12/30/2014    Past Surgical History:  Procedure Laterality Date  . ANTERIOR CRUCIATE LIGAMENT REPAIR Left 01/29/2017  . BREAST LUMPECTOMY Right    fibroadenoma  . DILATION AND CURETTAGE OF UTERUS    . KNEE ARTHROSCOPY      There were no vitals filed for this visit.      Subjective Assessment - 03/15/17 0815    Subjective Pt states that her knee is doing well. No knee pain. she felt good following last treatment session.   Patient Stated Goals get back mobility to where I was before   Currently in Pain? No/denies            [facility] PT Assessment -  03/15/17 0001      Assessment   Medical Diagnosis L ACL Allograft   Referring Provider Frederich Balding, MD   Onset Date/Surgical Date 01/29/17   Next MD Visit 10/29/108     Circumferential Edema   Circumferential - Right 14.5" joint line   Circumferential - Left  14.5" joint line     AROM   Left Knee Extension 0   Left Knee Flexion 112     Strength   Right Hip Extension --  was 4+   Left Hip Extension 4+/5  was 4   Left Hip ABduction 4+/5  was 4   Left Knee Flexion 4-/5   Left Knee Extension 4+/5              [facility] Adult PT Treatment/Exercise - 03/15/17 0001      Knee/Hip Exercises: Stretches   Gastroc Stretch Left;2 reps;30 seconds   Gastroc Stretch Limitations slant board     Knee/Hip Exercises: Standing   Hip Abduction Both;15 reps   Abduction Limitations RTB   Forward Step Up Left;20 reps;Hand Hold: 0;Step Height: 4"   Step Down Left;10 reps;Hand Hold: 0;Step Height: 4"   Functional Squat Limitations normal squat with brace limited to 40 degrees x15 reps   Rocker Board 2 minutes   SLS with Vectors on firm x5RT on LLE,  on foam x5RT on LLE   Other Standing Knee Exercises heel taps on 4" step 2x10      Knee/Hip Exercises: Supine   Short Arc Quad Sets 20 reps   Short Arc Quad Sets Limitations 5" holds               PT Education - 03/15/17 [phone]    Education provided Yes   Education Details reassessment findings, POC   Person(s) Educated Patient   Methods Explanation;Demonstration   Comprehension Verbalized understanding;Returned demonstration          PT Short Term Goals - 03/15/17 0817      PT SHORT TERM GOAL #1   Title Pt will be independent with HEP and perform consistently in order to maximize return to PLOF.   Time 3   Period Weeks   Status Achieved     PT SHORT TERM GOAL #2   Title Pt will have improved L knee AROM to 5-100 deg to decrease pain and maximize overall function.   Baseline 10/26: 0-112   Time 3   Period Weeks    Status Achieved     PT SHORT TERM GOAL #3   Title Pt will have improved MMT of hip musculature to 5/5 to maximize gait.    Time 3   Period Weeks   Status Achieved     PT SHORT TERM GOAL #4   Title Pt will have decreased swelling to at least 15" on the L at joint line to decrease pain and maximize ROM.   Baseline 10/26" 14.5" joint line   Time 3   Period Weeks   Status Achieved           PT Long Term Goals - 03/15/17 2979      PT LONG TERM GOAL #1   Title Pt will have improved L knee AROM to at least 0-120 deg to demo improved overall function and maximize sitting tolerance, gait, and stair ambulation   Baseline 10/26: 0-112   Time 6   Period Weeks   Status Partially Met     PT LONG TERM GOAL #2   Title Pt will have 5/5 L knee MMT to maximize gait and return to PLOF.   Baseline 10/26: 4+/5 quad, 4-/5 HS   Time 6   Period Weeks   Status Revised     PT LONG TERM GOAL #3   Title Pt will be able to perform SLS for at least 10 sec on LLE to demo improved function and maximize gait.    Baseline 10/26: 15 sec    Time 6   Period Weeks   Status Achieved     PT LONG TERM GOAL #4   Title Pt will be able to perform squatting with no knee pain and proper mechanics, and be able to lift at leat 20# from floor to chest in order to maximize ability to perform work and house/yard duties.    Time 6   Period Weeks   Status New   Target Date 04/26/17     PT LONG TERM GOAL #5   Title Pt will report being able to return to her regular gym/exercise routine with no knee pain in order to maximize return to PLOF.    Time 6   Period Weeks   Status New     Additional Long Term Goals   Additional Long Term Goals Yes     PT LONG TERM GOAL #6   Title Pt will be  able to ambulate a flight of stairs with no handrail, good eccentric control, and no knee pain to demonstrate improved functional knee strength and maximize work and community access.    Time 6   Period Weeks   Status New      PT LONG TERM GOAL #7   Title Pt will be able to perform L SL sit <> stand and L SL squat at least 3/5 times with no UE and no evidence of knee valgus to demo improved functional strength of LLE and decrese risk for reinjury.   Time 6   Period Weeks   Status New               Plan - 03/15/17 0737    Clinical Impression Statement PT reassessed pt's goals and outcome measures this date. Pt has made great progress towards her goals as illustrated above. Her AROM is still slightly limited in flexion as she was 0-112 this date. Her knee MMT was assessed this date and her quad was 4+/5 and her HS was 4-/5; her overall hip strength was 5/5. Pt still demonstrates a very minor lag in her SLR but in order to promote continued quad strengthening, PT opened pt's hinged brace to 40 deg. Ambulation was more normalized and pt felt more comfortable with gait with this extra 10 deg of flexion. She also stated that her R knee would hyperextend intermittently during gait and PT noted that her brace was set at -10 deg extension so PT adjusted brace to range from 0-40 deg in order to prevent this hyperextension. Pt is returning to her MD Monday 03/18/17. Goals were updated this date and cert send to MD. Pt needs continued skilled PT intervention to address remaining deficits and maximize return to PLOF.    Rehab Potential Good   PT Frequency 3x / week   PT Duration 6 weeks   PT Treatment/Interventions ADLs/Self Care Home Management;Cryotherapy;Electrical Stimulation;Moist Heat;Ultrasound;DME Instruction;Gait training;Stair training;Functional mobility training;Therapeutic activities;Therapeutic exercise;Balance training;Neuromuscular re-education;Patient/family education;Manual techniques;Wheelchair mobility training;Passive range of motion;Scar mobilization;Dry needling;Energy conservation;Taping   PT Next Visit Plan Follow [facility] Orthopedic ACL Allograft Reconstruction Protocol; Continue quad strengthening to  address extension lag. Continue PROM edge of table; continue heel taps and other TKE strengthening in standing; SAQ with no weight   PT Home Exercise Plan eval: supine calf stretch with rope, heel slides, quad sets; 9/19: 4-way SLR with brace; 10/24: standing TKE, supine SAQ and instructed patella mobs   Consulted and Agree with Plan of Care Patient      Patient will benefit from skilled therapeutic intervention in order to improve the following deficits and impairments:  Abnormal gait, Decreased activity tolerance, Decreased balance, Decreased endurance, Decreased mobility, Decreased range of motion, Decreased strength, Difficulty walking, Hypomobility, Increased edema, Increased muscle spasms, Increased fascial restricitons, Impaired flexibility, Pain  Visit Diagnosis: Acute pain of right knee - Plan: PT plan of care cert/re-cert  Stiffness of left knee - Plan: PT plan of care cert/re-cert  Muscle weakness (generalized) - Plan: PT plan of care cert/re-cert  Localized edema - Plan: PT plan of care cert/re-cert  Difficulty in walking, not elsewhere classified - Plan: PT plan of care cert/re-cert     Problem List Patient Active Problem List   Diagnosis Date Noted  . Hypercholesteremia 10/09/2016  . Immunization refused 09/11/2016  . Intermittent asthma without complication 10/62/6948  . Well woman exam with routine gynecological exam 01/13/2016  . Contraceptive management 03/31/2014  . Class 2 obesity due to excess  calories without serious comorbidity with body mass index (BMI) of 36.0 to 36.9 in adult 10/14/2013  . Migraines   . HTN (hypertension)   . Multiple allergies       Geraldine Solar PT, DPT  [place] [address] [place], [place], [address] Phone: [phone]   Fax:  [phone]  Name: Deanna Friedman MRN: [id number] Date of Birth: February 08, 1976

## 2017-03-18 ENCOUNTER — Ambulatory Visit: Payer: BC Managed Care – PPO | Admitting: "Endocrinology

## 2017-03-18 ENCOUNTER — Telehealth: Payer: Self-pay

## 2017-03-18 NOTE — Telephone Encounter (Signed)
Called pt to see where she had her labs done last week for todays visit with Dr Fransico HimNida. Pt became very hostile over the phone and states that she had labs done in June and that she will not be doing anymore and will not be returning. She states it is ridiculous to keep having labs done, to cancel her appt, and she will not be coming back. Pt then hung up.

## 2017-03-19 ENCOUNTER — Telehealth (HOSPITAL_COMMUNITY): Payer: Self-pay

## 2017-03-19 NOTE — Telephone Encounter (Signed)
Patient canceled she have to get ready for halloween with her kids

## 2017-03-20 ENCOUNTER — Ambulatory Visit (HOSPITAL_COMMUNITY): Payer: BC Managed Care – PPO

## 2017-03-22 ENCOUNTER — Ambulatory Visit (INDEPENDENT_AMBULATORY_CARE_PROVIDER_SITE_OTHER): Payer: BC Managed Care – PPO

## 2017-03-22 DIAGNOSIS — Z23 Encounter for immunization: Secondary | ICD-10-CM | POA: Diagnosis not present

## 2017-03-25 ENCOUNTER — Ambulatory Visit (HOSPITAL_COMMUNITY): Payer: BC Managed Care – PPO | Attending: Specialist | Admitting: Physical Therapy

## 2017-03-25 DIAGNOSIS — M6281 Muscle weakness (generalized): Secondary | ICD-10-CM | POA: Diagnosis present

## 2017-03-25 DIAGNOSIS — R262 Difficulty in walking, not elsewhere classified: Secondary | ICD-10-CM | POA: Diagnosis present

## 2017-03-25 DIAGNOSIS — M25662 Stiffness of left knee, not elsewhere classified: Secondary | ICD-10-CM

## 2017-03-25 DIAGNOSIS — M25561 Pain in right knee: Secondary | ICD-10-CM

## 2017-03-25 DIAGNOSIS — R600 Localized edema: Secondary | ICD-10-CM

## 2017-03-25 DIAGNOSIS — M25562 Pain in left knee: Secondary | ICD-10-CM | POA: Diagnosis present

## 2017-03-25 NOTE — Therapy (Signed)
[place], [place], [address] Phone: [phone]   Fax:  [phone]  Physical Therapy Treatment  Patient Details  Name: Deanna Friedman MRN: [id number] Date of Birth: [birth year] Referring Provider: Frederich Balding, MD   Encounter Date: 03/25/2017  PT End of Session - 03/25/17 1044    Visit Number  20    Number of Visits  37    Date for PT Re-Evaluation  03/15/17    Authorization Type  [place] Time Period  03/15/17 to 04/26/17    PT Start Time  0948    PT Stop Time  1030    PT Time Calculation (min)  42 min    Equipment Utilized During Treatment  Left knee immobilizer    Activity Tolerance  Patient tolerated treatment well;No increased pain    Behavior During Therapy  WFL for tasks assessed/performed       Past Medical History:  Diagnosis Date  . ADHD (attention deficit hyperactivity disorder)   . Allergy    food, meds, mold, dust  . Anemia   . Asthma   . Blood transfusion without reported diagnosis    childbirth  . Body aches 11/29/2014  . BV (bacterial vaginosis) 10/14/2013  . Contraceptive management 03/31/2014  . Decreased sex drive 08/29/3011  . Folliculitis 05/24/3886  . Heart murmur   . HTN (hypertension)   . Hypercholesteremia 10/09/2016  . Migraines   . Multiple allergies   . Obesity 10/14/2013  . Vaginal itching 12/30/2014    Past Surgical History:  Procedure Laterality Date  . ANTERIOR CRUCIATE LIGAMENT REPAIR Left 01/29/2017  . BREAST LUMPECTOMY Right    fibroadenoma  . DILATION AND CURETTAGE OF UTERUS    . KNEE ARTHROSCOPY      There were no vitals filed for this visit.  Subjective Assessment - 03/25/17 1257    Subjective  Pt states she is doing much better today.  Went back to MD and he wants her brace up 10 degrees each week for the next two weeks.  currently without real pain, just stiffness/tightness.    Currently in Pain?  No/denies                       [facility] Adult PT Treatment/Exercise - 03/25/17 0001      Knee/Hip Exercises: Stretches   Gastroc Stretch  Both;3 reps;30 seconds    Gastroc Stretch Limitations  slant board      Knee/Hip Exercises: Standing   Forward Lunges  Left;15 reps;Limitations    Forward Lunges Limitations  onto 4" box no UE's    Hip Abduction  Both;15 reps    Abduction Limitations  RTB    Lateral Step Up  Left;1 set;15 reps;Step Height: 6"    Lateral Step Up Limitations  6 inch box     Forward Step Up  Left;1 set;15 reps    Forward Step Up Limitations  6 inch box     Step Down  Left;Step Height: 4";15 reps;Hand Hold: 1    Step Down Limitations  4 inch box     Functional Squat  20 reps    Wall Squat  3 sets;Limitations    Wall Squat Limitations  20" holds with weight shift to Lt    SLS with Vectors  on firm x10RT on LLE, on foam x5RT on LLE      Knee/Hip Exercises: Supine   Short Arc [employer]  Sets  20 reps    Straight Leg Raises  20 reps    Straight Leg Raises Limitations  quad set prior SLR, noted slight extension lag this session    Knee Extension Limitations  0    Knee Flexion Limitations  116 120 AAROM   120 AAROM     Knee/Hip Exercises: Prone   Hamstring Curl  20 reps    Hip Extension  20 reps      Manual Therapy   Manual Therapy  Myofascial release    Manual therapy comments  completed separate rest of treatment    Soft tissue mobilization  lateral aspect of distal quad to decrease adhesions                PT Short Term Goals - 03/15/17 0817      PT SHORT TERM GOAL #1   Title  Pt will be independent with HEP and perform consistently in order to maximize return to PLOF.    Time  3    Period  Weeks    Status  Achieved      PT SHORT TERM GOAL #2   Title  Pt will have improved L knee AROM to 5-100 deg to decrease pain and maximize overall function.    Baseline  10/26: 0-112    Time  3    Period  Weeks    Status  Achieved      PT SHORT TERM GOAL #3    Title  Pt will have improved MMT of hip musculature to 5/5 to maximize gait.     Time  3    Period  Weeks    Status  Achieved      PT SHORT TERM GOAL #4   Title  Pt will have decreased swelling to at least 15" on the L at joint line to decrease pain and maximize ROM.    Baseline  10/26" 14.5" joint line    Time  3    Period  Weeks    Status  Achieved        PT Long Term Goals - 03/15/17 1950      PT LONG TERM GOAL #1   Title  Pt will have improved L knee AROM to at least 0-120 deg to demo improved overall function and maximize sitting tolerance, gait, and stair ambulation    Baseline  10/26: 0-112    Time  6    Period  Weeks    Status  Partially Met      PT LONG TERM GOAL #2   Title  Pt will have 5/5 L knee MMT to maximize gait and return to PLOF.    Baseline  10/26: 4+/5 quad, 4-/5 HS    Time  6    Period  Weeks    Status  Revised      PT LONG TERM GOAL #3   Title  Pt will be able to perform SLS for at least 10 sec on LLE to demo improved function and maximize gait.     Baseline  10/26: 15 sec     Time  6    Period  Weeks    Status  Achieved      PT LONG TERM GOAL #4   Title  Pt will be able to perform squatting with no knee pain and proper mechanics, and be able to lift at leat 20# from floor to chest in order to maximize ability to perform work and house/yard duties.  Time  6    Period  Weeks    Status  New    Target Date  04/26/17      PT LONG TERM GOAL #5   Title  Pt will report being able to return to her regular gym/exercise routine with no knee pain in order to maximize return to PLOF.     Time  6    Period  Weeks    Status  New      Additional Long Term Goals   Additional Long Term Goals  Yes      PT LONG TERM GOAL #6   Title  Pt will be able to ambulate a flight of stairs with no handrail, good eccentric control, and no knee pain to demonstrate improved functional knee strength and maximize work and community access.     Time  6    Period  Weeks     Status  New      PT LONG TERM GOAL #7   Title  Pt will be able to perform L SL sit <> stand and L SL squat at least 3/5 times with no UE and no evidence of knee valgus to demo improved functional strength of LLE and decrese risk for reinjury.    Time  6    Period  Weeks    Status  New            Plan - 03/25/17 1044    Clinical Impression Statement  Attempted to adjust brace, however goes from 70 to 90 degrees without an option for 80 degrees.  Discussed just increasing it next week to 90 degrees.  Able to increase to 6" box this session with forward and lateral step downs and completed 4" steps reciprocally wtihout HR without pain or issues.  Added wallsitting with weight shifted more to the Lt.  Pt wtih pinpoint tightness lateral knee and after completing manual to loosen adhesions pt with noted increase in ROM to 116 degrees AROM and 120 degrees AAROM.      Rehab Potential  Good    PT Frequency  3x / week    PT Duration  6 weeks    PT Treatment/Interventions  ADLs/Self Care Home Management;Cryotherapy;Electrical Stimulation;Moist Heat;Ultrasound;DME Instruction;Gait training;Stair training;Functional mobility training;Therapeutic activities;Therapeutic exercise;Balance training;Neuromuscular re-education;Patient/family education;Manual techniques;Wheelchair mobility training;Passive range of motion;Scar mobilization;Dry needling;Energy conservation;Taping    PT Next Visit Plan  Follow [facility] Orthopedic ACL Allograft Reconstruction Protocol; Continue quad strengthening to address extension lag. Continue PROM edge of table; continue heel taps and other TKE strengthening in standing; SAQ with no weight.  Begin isokinetic biodex next session.    PT Home Exercise Plan  eval: supine calf stretch with rope, heel slides, quad sets; 9/19: 4-way SLR with brace; 10/24: standing TKE, supine SAQ and instructed patella mobs    Consulted and Agree with Plan of Care  Patient       Patient will  benefit from skilled therapeutic intervention in order to improve the following deficits and impairments:  Abnormal gait, Decreased activity tolerance, Decreased balance, Decreased endurance, Decreased mobility, Decreased range of motion, Decreased strength, Difficulty walking, Hypomobility, Increased edema, Increased muscle spasms, Increased fascial restricitons, Impaired flexibility, Pain  Visit Diagnosis: Acute pain of right knee  Stiffness of left knee  Muscle weakness (generalized)  Localized edema  Difficulty in walking, not elsewhere classified     Problem List Patient Active Problem List   Diagnosis Date Noted  . Hypercholesteremia 10/09/2016  . Immunization refused 09/11/2016  .  Intermittent asthma without complication 39/68/8648  . Well woman exam with routine gynecological exam 01/13/2016  . Contraceptive management 03/31/2014  . Class 2 obesity due to excess calories without serious comorbidity with body mass index (BMI) of 36.0 to 36.9 in adult 10/14/2013  . Migraines   . HTN (hypertension)   . Multiple allergies    Teena Irani, PTA/CLT [phone]  Teena Irani 03/25/2017, 1:06 PM  [facility] [address] [place], [place], [address] Phone: [phone]   Fax:  [phone]  Name: Deanna Friedman MRN: [id number] Date of Birth: 1975-12-19

## 2017-03-27 ENCOUNTER — Encounter (HOSPITAL_COMMUNITY): Payer: Self-pay

## 2017-03-27 ENCOUNTER — Ambulatory Visit (HOSPITAL_COMMUNITY): Payer: BC Managed Care – PPO

## 2017-03-27 DIAGNOSIS — M25561 Pain in right knee: Secondary | ICD-10-CM

## 2017-03-27 DIAGNOSIS — R262 Difficulty in walking, not elsewhere classified: Secondary | ICD-10-CM

## 2017-03-27 DIAGNOSIS — M6281 Muscle weakness (generalized): Secondary | ICD-10-CM

## 2017-03-27 DIAGNOSIS — M25662 Stiffness of left knee, not elsewhere classified: Secondary | ICD-10-CM

## 2017-03-27 DIAGNOSIS — R600 Localized edema: Secondary | ICD-10-CM

## 2017-03-27 NOTE — Therapy (Signed)
[place], [place], [address] Phone: [phone]   Fax:  [phone]  Physical Therapy Treatment  Patient Details  Name: Deanna Friedman MRN: [id number] Date of Birth: 01/08/1976 Referring Provider: Frederich Balding, MD   Encounter Date: 03/27/2017  PT End of Session - 03/27/17 1607    Visit Number  21    Number of Visits  37    Date for PT Re-Evaluation  04/05/17    Authorization Type  [place] Time Period  03/15/17 to 04/26/17    PT Start Time  1600    PT Stop Time  1648    PT Time Calculation (min)  48 min    Equipment Utilized During Treatment  Left knee immobilizer    Activity Tolerance  Patient tolerated treatment well;No increased pain    Behavior During Therapy  WFL for tasks assessed/performed       Past Medical History:  Diagnosis Date  . ADHD (attention deficit hyperactivity disorder)   . Allergy    food, meds, mold, dust  . Anemia   . Asthma   . Blood transfusion without reported diagnosis    childbirth  . Body aches 11/29/2014  . BV (bacterial vaginosis) 10/14/2013  . Contraceptive management 03/31/2014  . Decreased sex drive 08/16/9240  . Folliculitis 10/26/3417  . Heart murmur   . HTN (hypertension)   . Hypercholesteremia 10/09/2016  . Migraines   . Multiple allergies   . Obesity 10/14/2013  . Vaginal itching 12/30/2014    Past Surgical History:  Procedure Laterality Date  . ANTERIOR CRUCIATE LIGAMENT REPAIR Left 01/29/2017  . BREAST LUMPECTOMY Right    fibroadenoma  . DILATION AND CURETTAGE OF UTERUS    . KNEE ARTHROSCOPY      There were no vitals filed for this visit.  Subjective Assessment - 03/27/17 1606    Subjective  Pt stated all is going well, no reports on knee pain.  Did report some intermittent pain with Rt hip during gait.  No current pain.      Patient Stated Goals  get back mobility to where I was before    Currently in Pain?  No/denies                       [facility] Adult PT Treatment/Exercise - 03/27/17 0001      Knee/Hip Exercises: Machines for Strengthening   Other Machine  Biodex isokinetic 90-40 degrees10 reps (210<-->190<-->180)      Knee/Hip Exercises: Standing   Forward Lunges  Left;15 reps;Limitations    Forward Lunges Limitations  onto 4" box no UE's    Abduction Limitations  2RT sidestep RTB    Lateral Step Up  Left;1 set;Step Height: 6";20 reps    Lateral Step Up Limitations  6 inch box     Forward Step Up  Left;1 set;20 reps    Forward Step Up Limitations  6 inch box     Step Down  Left;Step Height: 4";15 reps;Hand Hold: 1    Step Down Limitations  4 inch box     Wall Squat  3 sets;Limitations    Wall Squat Limitations  20" holds with weight shift to Lt    SLS with Vectors  5x 5" on Lt LE on foam    Rebounder  Lt SLS 10x on firm surface then 10x foam      Knee/Hip Exercises: Supine   Short Arc  Quad Sets  20 reps    Straight Leg Raises  20 reps    Straight Leg Raises Limitations  quad set prior SLR, noted slight extension lag this session    Straight Leg Raise with External Rotation  15 reps    Knee Extension Limitations  0    Knee Flexion Limitations  118 AROM 120 following manual   AROM 120 following manual     Knee/Hip Exercises: Prone   Hamstring Curl  20 reps    Hip Extension  20 reps      Manual Therapy   Manual Therapy  Myofascial release    Manual therapy comments  completed separate rest of treatment    Soft tissue mobilization  lateral aspect of distal quad to decrease adhesions                PT Short Term Goals - 03/15/17 0817      PT SHORT TERM GOAL #1   Title  Pt will be independent with HEP and perform consistently in order to maximize return to PLOF.    Time  3    Period  Weeks    Status  Achieved      PT SHORT TERM GOAL #2   Title  Pt will have improved L knee AROM to 5-100 deg to decrease pain and maximize overall function.    Baseline  10/26: 0-112     Time  3    Period  Weeks    Status  Achieved      PT SHORT TERM GOAL #3   Title  Pt will have improved MMT of hip musculature to 5/5 to maximize gait.     Time  3    Period  Weeks    Status  Achieved      PT SHORT TERM GOAL #4   Title  Pt will have decreased swelling to at least 15" on the L at joint line to decrease pain and maximize ROM.    Baseline  10/26" 14.5" joint line    Time  3    Period  Weeks    Status  Achieved        PT Long Term Goals - 03/15/17 9244      PT LONG TERM GOAL #1   Title  Pt will have improved L knee AROM to at least 0-120 deg to demo improved overall function and maximize sitting tolerance, gait, and stair ambulation    Baseline  10/26: 0-112    Time  6    Period  Weeks    Status  Partially Met      PT LONG TERM GOAL #2   Title  Pt will have 5/5 L knee MMT to maximize gait and return to PLOF.    Baseline  10/26: 4+/5 quad, 4-/5 HS    Time  6    Period  Weeks    Status  Revised      PT LONG TERM GOAL #3   Title  Pt will be able to perform SLS for at least 10 sec on LLE to demo improved function and maximize gait.     Baseline  10/26: 15 sec     Time  6    Period  Weeks    Status  Achieved      PT LONG TERM GOAL #4   Title  Pt will be able to perform squatting with no knee pain and proper mechanics, and be able to lift at  leat 20# from floor to chest in order to maximize ability to perform work and house/yard duties.     Time  6    Period  Weeks    Status  New    Target Date  04/26/17      PT LONG TERM GOAL #5   Title  Pt will report being able to return to her regular gym/exercise routine with no knee pain in order to maximize return to PLOF.     Time  6    Period  Weeks    Status  New      Additional Long Term Goals   Additional Long Term Goals  Yes      PT LONG TERM GOAL #6   Title  Pt will be able to ambulate a flight of stairs with no handrail, good eccentric control, and no knee pain to demonstrate improved functional knee  strength and maximize work and community access.     Time  6    Period  Weeks    Status  New      PT LONG TERM GOAL #7   Title  Pt will be able to perform L SL sit <> stand and L SL squat at least 3/5 times with no UE and no evidence of knee valgus to demo improved functional strength of LLE and decrese risk for reinjury.    Time  6    Period  Weeks    Status  New            Plan - 03/27/17 1608    Clinical Impression Statement  Continued therex per ACL protocol.  Added biodex isokinetic for quad strengthening (90-40 degrees).  Pt able to complete approriate form/techique wiht all therex with noted increased visible quad fatigue following biodex.  AROM improved to 0-120 following manual to lateral distal aspect of knee.  No reports of pain through session.      Rehab Potential  Good    PT Frequency  3x / week    PT Duration  6 weeks    PT Treatment/Interventions  ADLs/Self Care Home Management;Cryotherapy;Electrical Stimulation;Moist Heat;Ultrasound;DME Instruction;Gait training;Stair training;Functional mobility training;Therapeutic activities;Therapeutic exercise;Balance training;Neuromuscular re-education;Patient/family education;Manual techniques;Wheelchair mobility training;Passive range of motion;Scar mobilization;Dry needling;Energy conservation;Taping    PT Next Visit Plan  Follow [facility] Orthopedic ACL Allograft Reconstruction Protocol; Continue quad strengthening with SAQ no weight, TKE standing for strengthening and isokinetic biodex. Begin PRE hamstring strengtheing next session and adjust brace to 90 degrees flexion on 04/01/17    PT Home Exercise Plan  eval: supine calf stretch with rope, heel slides, quad sets; 9/19: 4-way SLR with brace; 10/24: standing TKE, supine SAQ and instructed patella mobs       Patient will benefit from skilled therapeutic intervention in order to improve the following deficits and impairments:  Abnormal gait, Decreased activity tolerance,  Decreased balance, Decreased endurance, Decreased mobility, Decreased range of motion, Decreased strength, Difficulty walking, Hypomobility, Increased edema, Increased muscle spasms, Increased fascial restricitons, Impaired flexibility, Pain  Visit Diagnosis: Acute pain of right knee  Stiffness of left knee  Muscle weakness (generalized)  Localized edema  Difficulty in walking, not elsewhere classified     Problem List Patient Active Problem List   Diagnosis Date Noted  . Hypercholesteremia 10/09/2016  . Immunization refused 09/11/2016  . Intermittent asthma without complication 17/51/0258  . Well woman exam with routine gynecological exam 01/13/2016  . Contraceptive management 03/31/2014  . Class 2 obesity due to excess calories without serious  comorbidity with body mass index (BMI) of 36.0 to 36.9 in adult 10/14/2013  . Migraines   . HTN (hypertension)   . Multiple allergies    Ihor Austin, LPTA; [place]  Aldona Lento 03/27/2017, 5:08 PM  [facility] [address] [place], [place], [address] Phone: [phone]   Fax:  [phone]  Name: Deanna Friedman MRN: [id number] Date of Birth: 1976/05/04

## 2017-03-29 ENCOUNTER — Ambulatory Visit (HOSPITAL_COMMUNITY): Payer: BC Managed Care – PPO

## 2017-03-29 ENCOUNTER — Telehealth (HOSPITAL_COMMUNITY): Payer: Self-pay

## 2017-04-01 ENCOUNTER — Ambulatory Visit (HOSPITAL_COMMUNITY): Payer: BC Managed Care – PPO

## 2017-04-01 ENCOUNTER — Telehealth (HOSPITAL_COMMUNITY): Payer: Self-pay | Admitting: Family Medicine

## 2017-04-01 NOTE — Telephone Encounter (Signed)
04/01/17  pt left a message to cancel appt but no reason was given

## 2017-04-02 ENCOUNTER — Other Ambulatory Visit: Payer: Self-pay | Admitting: Adult Health

## 2017-04-03 ENCOUNTER — Telehealth (HOSPITAL_COMMUNITY): Payer: Self-pay | Admitting: Family Medicine

## 2017-04-03 ENCOUNTER — Ambulatory Visit (HOSPITAL_COMMUNITY): Payer: BC Managed Care – PPO | Admitting: Physical Therapy

## 2017-04-03 NOTE — Telephone Encounter (Signed)
04/03/17  pt called to cx but no reason was given

## 2017-04-05 ENCOUNTER — Ambulatory Visit (HOSPITAL_COMMUNITY): Payer: BC Managed Care – PPO

## 2017-04-08 ENCOUNTER — Ambulatory Visit (HOSPITAL_COMMUNITY): Payer: BC Managed Care – PPO | Admitting: Physical Therapy

## 2017-04-08 ENCOUNTER — Encounter (HOSPITAL_COMMUNITY): Payer: Self-pay | Admitting: Physical Therapy

## 2017-04-08 DIAGNOSIS — R262 Difficulty in walking, not elsewhere classified: Secondary | ICD-10-CM

## 2017-04-08 DIAGNOSIS — M25662 Stiffness of left knee, not elsewhere classified: Secondary | ICD-10-CM

## 2017-04-08 DIAGNOSIS — M25561 Pain in right knee: Secondary | ICD-10-CM | POA: Diagnosis not present

## 2017-04-08 DIAGNOSIS — R600 Localized edema: Secondary | ICD-10-CM

## 2017-04-08 DIAGNOSIS — M6281 Muscle weakness (generalized): Secondary | ICD-10-CM

## 2017-04-08 NOTE — Therapy (Signed)
[facility] [place], [place], [address] Phone: [phone]   Fax:  [phone]  Physical Therapy Treatment  Patient Details  Name: Deanna Friedman MRN: [id number] Date of Birth: Oct 02, 1975 Referring Provider: Frederich Balding, MD   Encounter Date: 04/08/2017  PT End of Session - 04/08/17 1118    Visit Number  22    Number of Visits  37    Date for PT Re-Evaluation  04/05/17    Authorization Type  [place] Time Period  03/15/17 to 04/26/17    PT Start Time  1031    PT Stop Time  1110    PT Time Calculation (min)  39 min    Equipment Utilized During Treatment  Left knee immobilizer    Activity Tolerance  Patient tolerated treatment well    Behavior During Therapy  [facility] for tasks assessed/performed       Past Medical History:  Diagnosis Date  . ADHD (attention deficit hyperactivity disorder)   . Allergy    food, meds, mold, dust  . Anemia   . Asthma   . Blood transfusion without reported diagnosis    childbirth  . Body aches 11/29/2014  . BV (bacterial vaginosis) 10/14/2013  . Contraceptive management 03/31/2014  . Decreased sex drive 8/67/6195  . Folliculitis 0/01/3266  . Heart murmur   . HTN (hypertension)   . Hypercholesteremia 10/09/2016  . Migraines   . Multiple allergies   . Obesity 10/14/2013  . Vaginal itching 12/30/2014    Past Surgical History:  Procedure Laterality Date  . ANTERIOR CRUCIATE LIGAMENT REPAIR Left 01/29/2017  . BREAST LUMPECTOMY Right    fibroadenoma  . DILATION AND CURETTAGE OF UTERUS    . KNEE ARTHROSCOPY      There were no vitals filed for this visit.  Subjective Assessment - 04/08/17 1034    Subjective  Patient arrives stating she fell the other day on some ice on her deck, she fell on her R side and feels fine/was not injured. No other changes recently, no pain.     Patient Stated Goals  get back mobility to where I was before    Currently in Pain?   No/denies                      [facility] Adult PT Treatment/Exercise - 04/08/17 0001      Knee/Hip Exercises: Standing   Heel Raises  Both;1 set;20 reps    Heel Raises Limitations  toe and heel raises     Forward Lunges  Left;1 set;20 reps    Forward Lunges Limitations  onto 4" box no UE's    Lateral Step Up  Left;1 set;10 reps    Lateral Step Up Limitations  4 inch box, adjusted due to deep knee pain     Forward Step Up  Left;1 set;20 reps    Forward Step Up Limitations  6 inch box     Step Down  Left;1 set;15 reps    Step Down Limitations  4 inch box     Wall Squat  1 set;10 reps    Wall Squat Limitations  40 second holds     Other Standing Knee Exercises  hip hikes 2x10 B with and without LE swing; 3 way hip holsd     Other Standing Knee Exercises  3 way hip holds x10B       Knee/Hip Exercises: Supine   Straight  Leg Raises  20 reps    Straight Leg Raises Limitations  quad set prior       Knee/Hip Exercises: Prone   Hamstring Curl  20 reps 3#     Hip Extension  20 reps      Manual Therapy   Manual Therapy  --             PT Education - 04/08/17 1118    Education provided  No       PT Short Term Goals - 03/15/17 0817      PT SHORT TERM GOAL #1   Title  Pt will be independent with HEP and perform consistently in order to maximize return to PLOF.    Time  3    Period  Weeks    Status  Achieved      PT SHORT TERM GOAL #2   Title  Pt will have improved L knee AROM to 5-100 deg to decrease pain and maximize overall function.    Baseline  10/26: 0-112    Time  3    Period  Weeks    Status  Achieved      PT SHORT TERM GOAL #3   Title  Pt will have improved MMT of hip musculature to 5/5 to maximize gait.     Time  3    Period  Weeks    Status  Achieved      PT SHORT TERM GOAL #4   Title  Pt will have decreased swelling to at least 15" on the L at joint line to decrease pain and maximize ROM.    Baseline  10/26" 14.5" joint line    Time  3     Period  Weeks    Status  Achieved        PT Long Term Goals - 03/15/17 7867      PT LONG TERM GOAL #1   Title  Pt will have improved L knee AROM to at least 0-120 deg to demo improved overall function and maximize sitting tolerance, gait, and stair ambulation    Baseline  10/26: 0-112    Time  6    Period  Weeks    Status  Partially Met      PT LONG TERM GOAL #2   Title  Pt will have 5/5 L knee MMT to maximize gait and return to PLOF.    Baseline  10/26: 4+/5 quad, 4-/5 HS    Time  6    Period  Weeks    Status  Revised      PT LONG TERM GOAL #3   Title  Pt will be able to perform SLS for at least 10 sec on LLE to demo improved function and maximize gait.     Baseline  10/26: 15 sec     Time  6    Period  Weeks    Status  Achieved      PT LONG TERM GOAL #4   Title  Pt will be able to perform squatting with no knee pain and proper mechanics, and be able to lift at leat 20# from floor to chest in order to maximize ability to perform work and house/yard duties.     Time  6    Period  Weeks    Status  New    Target Date  04/26/17      PT LONG TERM GOAL #5   Title  Pt will report being able to  return to her regular gym/exercise routine with no knee pain in order to maximize return to PLOF.     Time  6    Period  Weeks    Status  New      Additional Long Term Goals   Additional Long Term Goals  Yes      PT LONG TERM GOAL #6   Title  Pt will be able to ambulate a flight of stairs with no handrail, good eccentric control, and no knee pain to demonstrate improved functional knee strength and maximize work and community access.     Time  6    Period  Weeks    Status  New      PT LONG TERM GOAL #7   Title  Pt will be able to perform L SL sit <> stand and L SL squat at least 3/5 times with no UE and no evidence of knee valgus to demo improved functional strength of LLE and decrese risk for reinjury.    Time  6    Period  Weeks    Status  New            Plan -  04/08/17 1119    Clinical Impression Statement  Continued with functional strengthening this session as indicated in POC/protocol; patient continues to do well with skilled PT services moving forward, however note she did fall the other day slipping on some ice. Patient frequently complains of muscle fatigue however does not show significant muscle tremor or other significant visual signs of fatigue during most exercises. Did not perform manual as no significant fascial restrictions were found this session L anterior and lateral thigh.     Rehab Potential  Good    PT Frequency  3x / week    PT Duration  6 weeks    PT Treatment/Interventions  ADLs/Self Care Home Management;Cryotherapy;Electrical Stimulation;Moist Heat;Ultrasound;DME Instruction;Gait training;Stair training;Functional mobility training;Therapeutic activities;Therapeutic exercise;Balance training;Neuromuscular re-education;Patient/family education;Manual techniques;Wheelchair mobility training;Passive range of motion;Scar mobilization;Dry needling;Energy conservation;Taping    PT Next Visit Plan  Follow [facility] ACL Allograft Reconstruction Protocol; Continue quad strengthening with SAQ no weight, TKE standing for strengthening and isokinetic biodex. Begin PRE hamstring strengtheing next session and adjust brace to 90 degrees flexion on 04/01/17    PT Home Exercise Plan  eval: supine calf stretch with rope, heel slides, quad sets; 9/19: 4-way SLR with brace; 10/24: standing TKE, supine SAQ and instructed patella mobs    Consulted and Agree with Plan of Care  Patient       Patient will benefit from skilled therapeutic intervention in order to improve the following deficits and impairments:  Abnormal gait, Decreased activity tolerance, Decreased balance, Decreased endurance, Decreased mobility, Decreased range of motion, Decreased strength, Difficulty walking, Hypomobility, Increased edema, Increased muscle spasms, Increased  fascial restricitons, Impaired flexibility, Pain  Visit Diagnosis: Acute pain of right knee  Stiffness of left knee  Muscle weakness (generalized)  Localized edema  Difficulty in walking, not elsewhere classified     Problem List Patient Active Problem List   Diagnosis Date Noted  . Hypercholesteremia 10/09/2016  . Immunization refused 09/11/2016  . Intermittent asthma without complication 39/53/2023  . Well woman exam with routine gynecological exam 01/13/2016  . Contraceptive management 03/31/2014  . Class 2 obesity due to excess calories without serious comorbidity with body mass index (BMI) of 36.0 to 36.9 in adult 10/14/2013  . Migraines   . HTN (hypertension)   . Multiple allergies  Deniece Ree PT, DPT, CBIS  [facility] [address] [place], [place], [address] Phone: [phone]   Fax:  [phone]  Name: Deanna Friedman MRN: [id number] Date of Birth: Oct 30, 1975

## 2017-04-10 ENCOUNTER — Ambulatory Visit (HOSPITAL_COMMUNITY): Payer: BC Managed Care – PPO

## 2017-04-10 DIAGNOSIS — M25662 Stiffness of left knee, not elsewhere classified: Secondary | ICD-10-CM

## 2017-04-10 DIAGNOSIS — M25562 Pain in left knee: Secondary | ICD-10-CM

## 2017-04-10 DIAGNOSIS — M6281 Muscle weakness (generalized): Secondary | ICD-10-CM

## 2017-04-10 DIAGNOSIS — R600 Localized edema: Secondary | ICD-10-CM

## 2017-04-10 DIAGNOSIS — R262 Difficulty in walking, not elsewhere classified: Secondary | ICD-10-CM

## 2017-04-10 DIAGNOSIS — M25561 Pain in right knee: Secondary | ICD-10-CM | POA: Diagnosis not present

## 2017-04-10 NOTE — Therapy (Signed)
[facility] [place], [place], [address] Phone: [phone]   Fax:  [phone]  Physical Therapy Treatment  Patient Details  Name: Deanna Friedman MRN: [id number] Date of Birth: 09/26/1975 Referring Provider: Frederich Balding, MD   Encounter Date: 04/10/2017  PT End of Session - 04/10/17 1119    Visit Number  23    Number of Visits  37    Date for PT Re-Evaluation  04/05/17    Authorization Type  [place] Time Period  03/15/17 to 04/26/17    PT Start Time  1115    PT Stop Time  1205    PT Time Calculation (min)  50 min    Equipment Utilized During Treatment  Left knee immobilizer    Activity Tolerance  Patient tolerated treatment well    Behavior During Therapy  [facility] for tasks assessed/performed       Past Medical History:  Diagnosis Date  . ADHD (attention deficit hyperactivity disorder)   . Allergy    food, meds, mold, dust  . Anemia   . Asthma   . Blood transfusion without reported diagnosis    childbirth  . Body aches 11/29/2014  . BV (bacterial vaginosis) 10/14/2013  . Contraceptive management 03/31/2014  . Decreased sex drive 4/74/2595  . Folliculitis 10/22/8754  . Heart murmur   . HTN (hypertension)   . Hypercholesteremia 10/09/2016  . Migraines   . Multiple allergies   . Obesity 10/14/2013  . Vaginal itching 12/30/2014    Past Surgical History:  Procedure Laterality Date  . ANTERIOR CRUCIATE LIGAMENT REPAIR Left 01/29/2017  . BREAST LUMPECTOMY Right    fibroadenoma  . DILATION AND CURETTAGE OF UTERUS    . KNEE ARTHROSCOPY      There were no vitals filed for this visit.  Subjective Assessment - 04/10/17 1119    Subjective  Pt saw her MD yesterday who was very pleased with her progress. He has taken her out of her hinged brace and placed her in a sleeve with medial/lateral supports. She is to see him again in 6 weeks.    Patient Stated Goals  get back mobility to where I was  before    Currently in Pain?  No/denies             [facility] Adult PT Treatment/Exercise - 04/10/17 0001      Knee/Hip Exercises: Standing   Lateral Step Up  Left;20 reps;Hand Hold: 0;Step Height: 6"    Lateral Step Up Limitations  bil 5# DB    Forward Step Up  Left;20 reps;Hand Hold: 0;Step Height: 6"    Forward Step Up Limitations  bil 5# DB    Functional Squat  2 sets;10 reps    Functional Squat Limitations  front loaded with 5# DB    Wall Squat Limitations  wall sits 5x10" holds with bil 5# DB; wall sits and hip abd with GTB 3x15 reps    Other Standing Knee Exercises  sidestepping wtih GTB with knees bent and constant tension on teh band [id number]    Other Standing Knee Exercises  heel taps on 4" step, 5# DB in RUE, BTB resisting valgus pull 4x5 reps      Knee/Hip Exercises: Seated   Other Seated Knee/Hip Exercises  L single leg STS x8 reps (significant valgus, cues and TB used to correct)      Knee/Hip Exercises: Supine   Short Arc [employer]  Left;20 reps    Short Arc Quad Sets Limitations  2#, 3" holds    Bridges  Both;2 sets;10 reps on physioball with HS curl      Knee/Hip Exercises: Prone   Hamstring Curl  2 sets;10 reps    Hamstring Curl Limitations  GTB             PT Education - 04/10/17 1121    Education provided  Yes    Education Details  exercise technique, updated HEP and discontinued initial exercises (see below); will can MD to ensure it is safe to begin strengthening through full range    Person(s) Educated  Patient    Methods  Explanation;Demonstration;Handout    Comprehension  Verbalized understanding;Returned demonstration       PT Short Term Goals - 03/15/17 0817      PT SHORT TERM GOAL #1   Title  Pt will be independent with HEP and perform consistently in order to maximize return to PLOF.    Time  3    Period  Weeks    Status  Achieved      PT SHORT TERM GOAL #2   Title  Pt will have improved L knee AROM to 5-100 deg to decrease pain and  maximize overall function.    Baseline  10/26: 0-112    Time  3    Period  Weeks    Status  Achieved      PT SHORT TERM GOAL #3   Title  Pt will have improved MMT of hip musculature to 5/5 to maximize gait.     Time  3    Period  Weeks    Status  Achieved      PT SHORT TERM GOAL #4   Title  Pt will have decreased swelling to at least 15" on the L at joint line to decrease pain and maximize ROM.    Baseline  10/26" 14.5" joint line    Time  3    Period  Weeks    Status  Achieved        PT Long Term Goals - 03/15/17 0175      PT LONG TERM GOAL #1   Title  Pt will have improved L knee AROM to at least 0-120 deg to demo improved overall function and maximize sitting tolerance, gait, and stair ambulation    Baseline  10/26: 0-112    Time  6    Period  Weeks    Status  Partially Met      PT LONG TERM GOAL #2   Title  Pt will have 5/5 L knee MMT to maximize gait and return to PLOF.    Baseline  10/26: 4+/5 quad, 4-/5 HS    Time  6    Period  Weeks    Status  Revised      PT LONG TERM GOAL #3   Title  Pt will be able to perform SLS for at least 10 sec on LLE to demo improved function and maximize gait.     Baseline  10/26: 15 sec     Time  6    Period  Weeks    Status  Achieved      PT LONG TERM GOAL #4   Title  Pt will be able to perform squatting with no knee pain and proper mechanics, and be able to lift at leat 20# from floor to chest in order to maximize ability to perform work and house/yard  duties.     Time  6    Period  Weeks    Status  New    Target Date  04/26/17      PT LONG TERM GOAL #5   Title  Pt will report being able to return to her regular gym/exercise routine with no knee pain in order to maximize return to PLOF.     Time  6    Period  Weeks    Status  New      Additional Long Term Goals   Additional Long Term Goals  Yes      PT LONG TERM GOAL #6   Title  Pt will be able to ambulate a flight of stairs with no handrail, good eccentric control,  and no knee pain to demonstrate improved functional knee strength and maximize work and community access.     Time  6    Period  Weeks    Status  New      PT LONG TERM GOAL #7   Title  Pt will be able to perform L SL sit <> stand and L SL squat at least 3/5 times with no UE and no evidence of knee valgus to demo improved functional strength of LLE and decrese risk for reinjury.    Time  6    Period  Weeks    Status  New            Plan - 04/10/17 1207    Clinical Impression Statement  Pt presented to therapy after her 10-week f/u with her surgeon. She states that he is very pleased with her progress thus far, has taken her out of her hinged brace and put her in sleeve with medial/lateral support, and told her to f/u in 6 weeks. PT assessed pt's 1RM for quad strength this date on cybex machine. Pt able to do 5 plates (24#) with RLE and 1 plate (9#) with LLE, equating to 20% quad strength in the L versus the R. Progressed pt's strengthening this date with good tolerance and no reports of real knee pain, just fatigue. This PT will call her surgeon to ensure that we can begin strengthening through full range to ensure pt's protocol will not be compromised.  PT corrected visit dianosis code this visit - it was entered as right knee which is incorrect, so PT corrected the diagnosis code to state "Acute pain of L knee"    Rehab Potential  Good    PT Frequency  3x / week    PT Duration  6 weeks    PT Treatment/Interventions  ADLs/Self Care Home Management;Cryotherapy;Electrical Stimulation;Moist Heat;Ultrasound;DME Instruction;Gait training;Stair training;Functional mobility training;Therapeutic activities;Therapeutic exercise;Balance training;Neuromuscular re-education;Patient/family education;Manual techniques;Wheelchair mobility training;Passive range of motion;Scar mobilization;Dry needling;Energy conservation;Taping    PT Next Visit Plan  Follow [facility] Orthopedic ACL Allograft Reconstruction  Protocol; Continue quad strengthening with SAQ with weight, isokinetic biodex. Continue quad, HS, and hip strengthening. Ensure pt not going into valgus with strengthening activities; begin SLS on foam with perturbations/rebounder/star drill, etc. to promote proprioception and dynamic stability    PT Home Exercise Plan  11/21: told pt to discontinue initial HEP but to continue TKE, SAQ and add heel taps on step, sidestep with constant tension, wall squats and wall sits for strengthening    Consulted and Agree with Plan of Care  Patient       Patient will benefit from skilled therapeutic intervention in order to improve the following deficits and impairments:  Abnormal gait,  Decreased activity tolerance, Decreased balance, Decreased endurance, Decreased mobility, Decreased range of motion, Decreased strength, Difficulty walking, Hypomobility, Increased edema, Increased muscle spasms, Increased fascial restricitons, Impaired flexibility, Pain  Visit Diagnosis: Acute pain of left knee  Stiffness of left knee  Muscle weakness (generalized)  Localized edema  Difficulty in walking, not elsewhere classified     Problem List Patient Active Problem List   Diagnosis Date Noted  . Hypercholesteremia 10/09/2016  . Immunization refused 09/11/2016  . Intermittent asthma without complication 98/33/8250  . Well woman exam with routine gynecological exam 01/13/2016  . Contraceptive management 03/31/2014  . Class 2 obesity due to excess calories without serious comorbidity with body mass index (BMI) of 36.0 to 36.9 in adult 10/14/2013  . Migraines   . HTN (hypertension)   . Multiple allergies         Geraldine Solar PT, DPT  [facility] [address] [place], [place], [address] Phone: [phone]   Fax:  [phone]  Name: Deanna Friedman MRN: [id number] Date of Birth: 25-Jul-1975

## 2017-04-10 NOTE — Patient Instructions (Signed)
  Eccentric Heel Lowering  Stand on involved leg on the box, with other leg off to the side. Lower uninvolved leg, keeping both legs straight, and touch heel to the ground. Do not shift weight to uninvolved leg (just tap the ground and come back up).     ELASTIC BAND LATERAL WALKS   With an elastic band around both ankles, walk to the side while keeping your feet spread apart. Keep your knees bent the entire time and keep tension on the band the entire time   Wall squats and Wall sits  Sit against the wall and hold the position. Legs should be about 90 degrees from the floor.

## 2017-04-15 ENCOUNTER — Ambulatory Visit (HOSPITAL_COMMUNITY): Payer: BC Managed Care – PPO | Admitting: Physical Therapy

## 2017-04-15 DIAGNOSIS — M25562 Pain in left knee: Secondary | ICD-10-CM

## 2017-04-15 DIAGNOSIS — M6281 Muscle weakness (generalized): Secondary | ICD-10-CM

## 2017-04-15 DIAGNOSIS — M25662 Stiffness of left knee, not elsewhere classified: Secondary | ICD-10-CM

## 2017-04-15 DIAGNOSIS — M25561 Pain in right knee: Secondary | ICD-10-CM | POA: Diagnosis not present

## 2017-04-15 NOTE — Therapy (Signed)
[place], [place], [address] Phone: [phone]   Fax:  [phone]  Physical Therapy Treatment  Patient Details  Name: Deanna Friedman MRN: [id number] Date of Birth: 17-Jun-1975 Referring Provider: Frederich Balding, MD   Encounter Date: 04/15/2017  PT End of Session - 04/15/17 1740    Visit Number  24    Number of Visits  37    Date for PT Re-Evaluation  04/05/17    Authorization Type  [place] Time Period  03/15/17 to 04/26/17    PT Start Time  1652    PT Stop Time  1725    PT Time Calculation (min)  33 min    Equipment Utilized During Treatment  Left knee immobilizer    Activity Tolerance  Patient tolerated treatment well    Behavior During Therapy  [facility] for tasks assessed/performed       Past Medical History:  Diagnosis Date  . ADHD (attention deficit hyperactivity disorder)   . Allergy    food, meds, mold, dust  . Anemia   . Asthma   . Blood transfusion without reported diagnosis    childbirth  . Body aches 11/29/2014  . BV (bacterial vaginosis) 10/14/2013  . Contraceptive management 03/31/2014  . Decreased sex drive 8/89/1694  . Folliculitis 5/0/3888  . Heart murmur   . HTN (hypertension)   . Hypercholesteremia 10/09/2016  . Migraines   . Multiple allergies   . Obesity 10/14/2013  . Vaginal itching 12/30/2014    Past Surgical History:  Procedure Laterality Date  . ANTERIOR CRUCIATE LIGAMENT REPAIR Left 01/29/2017  . BREAST LUMPECTOMY Right    fibroadenoma  . DILATION AND CURETTAGE OF UTERUS    . KNEE ARTHROSCOPY      There were no vitals filed for this visit.  Subjective Assessment - 04/15/17 1656    Subjective  Pt reports no pain or issues.    Currently in Pain?  No/denies                      [facility] Adult PT Treatment/Exercise - 04/15/17 0001      Knee/Hip Exercises: Machines for Strengthening   Cybex Knee Extension  1 Pl, 2sets of 5 reps Lt  only    Cybex Knee Flexion  2 Pl, 2 sets of 10, Lt only      Knee/Hip Exercises: Standing   Lateral Step Up  Left;20 reps;Hand Hold: 0;Step Height: 6"    Lateral Step Up Limitations  bil 5# DB    Forward Step Up  Left;20 reps;Hand Hold: 0;Step Height: 6"    Forward Step Up Limitations  bil 5# DB    Step Down  Left;1 set;15 reps;Step Height: 6"    Step Down Limitations  6 inch box     Functional Squat  2 sets;15 reps    Functional Squat Limitations  front loaded with 5# DB    Other Standing Knee Exercises  sidestepping wtih GTB with knees bent and constant tension on the band [id number]    Other Standing Knee Exercises  heel taps on 4" step, 5# DB in RUE, BTB resisting valgus pull 4x5 reps      Knee/Hip Exercises: Supine   Bridges  Both;2 sets;10 reps    Straight Leg Raises  20 reps               PT Short Term Goals - 03/15/17 02800  PT SHORT TERM GOAL #1   Title  Pt will be independent with HEP and perform consistently in order to maximize return to PLOF.    Time  3    Period  Weeks    Status  Achieved      PT SHORT TERM GOAL #2   Title  Pt will have improved L knee AROM to 5-100 deg to decrease pain and maximize overall function.    Baseline  10/26: 0-112    Time  3    Period  Weeks    Status  Achieved      PT SHORT TERM GOAL #3   Title  Pt will have improved MMT of hip musculature to 5/5 to maximize gait.     Time  3    Period  Weeks    Status  Achieved      PT SHORT TERM GOAL #4   Title  Pt will have decreased swelling to at least 15" on the L at joint line to decrease pain and maximize ROM.    Baseline  10/26" 14.5" joint line    Time  3    Period  Weeks    Status  Achieved        PT Long Term Goals - 03/15/17 8101      PT LONG TERM GOAL #1   Title  Pt will have improved L knee AROM to at least 0-120 deg to demo improved overall function and maximize sitting tolerance, gait, and stair ambulation    Baseline  10/26: 0-112    Time  6    Period   Weeks    Status  Partially Met      PT LONG TERM GOAL #2   Title  Pt will have 5/5 L knee MMT to maximize gait and return to PLOF.    Baseline  10/26: 4+/5 quad, 4-/5 HS    Time  6    Period  Weeks    Status  Revised      PT LONG TERM GOAL #3   Title  Pt will be able to perform SLS for at least 10 sec on LLE to demo improved function and maximize gait.     Baseline  10/26: 15 sec     Time  6    Period  Weeks    Status  Achieved      PT LONG TERM GOAL #4   Title  Pt will be able to perform squatting with no knee pain and proper mechanics, and be able to lift at leat 20# from floor to chest in order to maximize ability to perform work and house/yard duties.     Time  6    Period  Weeks    Status  New    Target Date  04/26/17      PT LONG TERM GOAL #5   Title  Pt will report being able to return to her regular gym/exercise routine with no knee pain in order to maximize return to PLOF.     Time  6    Period  Weeks    Status  New      Additional Long Term Goals   Additional Long Term Goals  Yes      PT LONG TERM GOAL #6   Title  Pt will be able to ambulate a flight of stairs with no handrail, good eccentric control, and no knee pain to demonstrate improved functional knee strength and maximize work and  community access.     Time  6    Period  Weeks    Status  New      PT LONG TERM GOAL #7   Title  Pt will be able to perform L SL sit <> stand and L SL squat at least 3/5 times with no UE and no evidence of knee valgus to demo improved functional strength of LLE and decrese risk for reinjury.    Time  6    Period  Weeks    Status  New            Plan - 04/15/17 1738    Clinical Impression Statement  PT continues to do well with no pain or complaints.  Began quad/ham work out with low resistance on cybex machines and continued with remaining therex to improve LE/hip strength.  Heel taps with valgus pulls continues to be most difficult for patient.      Rehab Potential   Good    PT Frequency  3x / week    PT Duration  6 weeks    PT Treatment/Interventions  ADLs/Self Care Home Management;Cryotherapy;Electrical Stimulation;Moist Heat;Ultrasound;DME Instruction;Gait training;Stair training;Functional mobility training;Therapeutic activities;Therapeutic exercise;Balance training;Neuromuscular re-education;Patient/family education;Manual techniques;Wheelchair mobility training;Passive range of motion;Scar mobilization;Dry needling;Energy conservation;Taping    PT Next Visit Plan  Follow [facility] Orthopedic ACL Allograft Reconstruction Protocol; Continue quad strengthening with SAQ with weight, isokinetic biodex. Continue quad, HS, and hip strengthening. Ensure pt not going into valgus with strengthening activities; begin SLS on foam with perturbations/rebounder/star drill, etc. to promote proprioception and dynamic stability    PT Home Exercise Plan  11/21: told pt to discontinue initial HEP but to continue TKE, SAQ and add heel taps on step, sidestep with constant tension, wall squats and wall sits for strengthening    Consulted and Agree with Plan of Care  Patient       Patient will benefit from skilled therapeutic intervention in order to improve the following deficits and impairments:  Abnormal gait, Decreased activity tolerance, Decreased balance, Decreased endurance, Decreased mobility, Decreased range of motion, Decreased strength, Difficulty walking, Hypomobility, Increased edema, Increased muscle spasms, Increased fascial restricitons, Impaired flexibility, Pain  Visit Diagnosis: Acute pain of left knee  Stiffness of left knee  Muscle weakness (generalized)     Problem List Patient Active Problem List   Diagnosis Date Noted  . Hypercholesteremia 10/09/2016  . Immunization refused 09/11/2016  . Intermittent asthma without complication 97/84/7841  . Well woman exam with routine gynecological exam 01/13/2016  . Contraceptive management 03/31/2014  .  Class 2 obesity due to excess calories without serious comorbidity with body mass index (BMI) of 36.0 to 36.9 in adult 10/14/2013  . Migraines   . HTN (hypertension)   . Multiple allergies    Teena Irani, PTA/CLT [phone]  Teena Irani 04/15/2017, 5:41 PM  [facility] [address] [place], [place], [address] Phone: [phone]   Fax:  [phone]  Name: MYLIN GIGNAC MRN: [id number] Date of Birth: 1976/01/27

## 2017-04-17 ENCOUNTER — Ambulatory Visit (HOSPITAL_COMMUNITY): Payer: BC Managed Care – PPO

## 2017-04-17 ENCOUNTER — Telehealth (HOSPITAL_COMMUNITY): Payer: Self-pay

## 2017-04-17 NOTE — Telephone Encounter (Signed)
She can not get here today due to other issues

## 2017-04-18 ENCOUNTER — Encounter (HOSPITAL_COMMUNITY): Payer: Self-pay

## 2017-04-18 ENCOUNTER — Ambulatory Visit (HOSPITAL_COMMUNITY): Payer: BC Managed Care – PPO

## 2017-04-18 DIAGNOSIS — M25561 Pain in right knee: Secondary | ICD-10-CM

## 2017-04-18 DIAGNOSIS — M25662 Stiffness of left knee, not elsewhere classified: Secondary | ICD-10-CM

## 2017-04-18 DIAGNOSIS — M6281 Muscle weakness (generalized): Secondary | ICD-10-CM

## 2017-04-18 DIAGNOSIS — M25562 Pain in left knee: Secondary | ICD-10-CM

## 2017-04-18 DIAGNOSIS — R262 Difficulty in walking, not elsewhere classified: Secondary | ICD-10-CM

## 2017-04-18 DIAGNOSIS — R600 Localized edema: Secondary | ICD-10-CM

## 2017-04-18 NOTE — Therapy (Signed)
[place], [place], [address] Phone: [phone]   Fax:  [phone]  Physical Therapy Treatment  Patient Details  Name: Deanna Friedman MRN: [id number] Date of Birth: July 19, 1975 Referring Provider: Frederich Balding, MD   Encounter Date: 04/18/2017  PT End of Session - 04/18/17 1727    Visit Number  25    Number of Visits  37    Date for PT Re-Evaluation  04/05/17    Authorization Type  [place] Time Period  03/15/17 to 04/26/17    PT Start Time  1646    PT Stop Time  1726    PT Time Calculation (min)  40 min    Equipment Utilized During Treatment  Left knee immobilizer    Activity Tolerance  Patient tolerated treatment well    Behavior During Therapy  [facility] for tasks assessed/performed       Past Medical History:  Diagnosis Date  . ADHD (attention deficit hyperactivity disorder)   . Allergy    food, meds, mold, dust  . Anemia   . Asthma   . Blood transfusion without reported diagnosis    childbirth  . Body aches 11/29/2014  . BV (bacterial vaginosis) 10/14/2013  . Contraceptive management 03/31/2014  . Decreased sex drive 05/21/7508  . Folliculitis 06/25/8525  . Heart murmur   . HTN (hypertension)   . Hypercholesteremia 10/09/2016  . Migraines   . Multiple allergies   . Obesity 10/14/2013  . Vaginal itching 12/30/2014    Past Surgical History:  Procedure Laterality Date  . ANTERIOR CRUCIATE LIGAMENT REPAIR Left 01/29/2017  . BREAST LUMPECTOMY Right    fibroadenoma  . DILATION AND CURETTAGE OF UTERUS    . KNEE ARTHROSCOPY      There were no vitals filed for this visit.  Subjective Assessment - 04/18/17 1651    Subjective  Pt notes that she is doing pretty good, no pain. She reports wearing boots the other day and noticing some foot slapping on the left. She also feels she is compensating.     Currently in Pain?  No/denies                      [facility] Adult  PT Treatment/Exercise - 04/18/17 0001      Ambulation/Gait   Gait Comments  Slight decreased WB during stance phase Lt. LE and decreased functional knee flexion with swing through.       Knee/Hip Exercises: Machines for Strengthening   Cybex Knee Extension  1 Pl, 2sets of 5 reps Lt only    Cybex Knee Flexion  2 Pl, 2 sets of 10, Lt only    Other Machine  power tower 23 deg, 2x10      Knee/Hip Exercises: Standing   Heel Raises  Both;1 set;20 reps    Heel Raises Limitations  toe and heel raises     Forward Lunges  Left;1 set;20 reps    Forward Lunges Limitations  onto 4" box no UE's    Forward Step Up  Step Height: 8";20 reps;Left;Other (comment) Holding 5# weights    Forward Step Up Limitations  bil 5# DB    Step Down  20 reps;Step Height: 6";Right;Hand Hold: 0;Other (comment) Holding 5# weights    Step Down Limitations  6 inch box    Functional Squat  2 sets;15 reps    Functional Squat Limitations  front loaded with 8# DB  Wall Squat  1 set;10 reps    Wall Squat Limitations  wall sits 5x10"     Lunge Walking - Round Trips  Functional squat to a 18'' box min assist at end range 5 reps    Other Standing Knee Exercises  sidestepping wtih GTB with knees bent and constant tension on the band [id number]    Other Standing Knee Exercises  heel taps on 4" step, 5# DB in RUE, BTB resisting valgus pull 4x5 reps               PT Short Term Goals - 03/15/17 0817      PT SHORT TERM GOAL #1   Title  Pt will be independent with HEP and perform consistently in order to maximize return to PLOF.    Time  3    Period  Weeks    Status  Achieved      PT SHORT TERM GOAL #2   Title  Pt will have improved L knee AROM to 5-100 deg to decrease pain and maximize overall function.    Baseline  10/26: 0-112    Time  3    Period  Weeks    Status  Achieved      PT SHORT TERM GOAL #3   Title  Pt will have improved MMT of hip musculature to 5/5 to maximize gait.     Time  3    Period  Weeks     Status  Achieved      PT SHORT TERM GOAL #4   Title  Pt will have decreased swelling to at least 15" on the L at joint line to decrease pain and maximize ROM.    Baseline  10/26" 14.5" joint line    Time  3    Period  Weeks    Status  Achieved        PT Long Term Goals - 03/15/17 06568     PT LONG TERM GOAL #1   Title  Pt will have improved L knee AROM to at least 0-120 deg to demo improved overall function and maximize sitting tolerance, gait, and stair ambulation    Baseline  10/26: 0-112    Time  6    Period  Weeks    Status  Partially Met      PT LONG TERM GOAL #2   Title  Pt will have 5/5 L knee MMT to maximize gait and return to PLOF.    Baseline  10/26: 4+/5 quad, 4-/5 HS    Time  6    Period  Weeks    Status  Revised      PT LONG TERM GOAL #3   Title  Pt will be able to perform SLS for at least 10 sec on LLE to demo improved function and maximize gait.     Baseline  10/26: 15 sec     Time  6    Period  Weeks    Status  Achieved      PT LONG TERM GOAL #4   Title  Pt will be able to perform squatting with no knee pain and proper mechanics, and be able to lift at leat 20# from floor to chest in order to maximize ability to perform work and house/yard duties.     Time  6    Period  Weeks    Status  New    Target Date  04/26/17      PT LONG TERM  GOAL #5   Title  Pt will report being able to return to her regular gym/exercise routine with no knee pain in order to maximize return to PLOF.     Time  6    Period  Weeks    Status  New      Additional Long Term Goals   Additional Long Term Goals  Yes      PT LONG TERM GOAL #6   Title  Pt will be able to ambulate a flight of stairs with no handrail, good eccentric control, and no knee pain to demonstrate improved functional knee strength and maximize work and community access.     Time  6    Period  Weeks    Status  New      PT LONG TERM GOAL #7   Title  Pt will be able to perform L SL sit <> stand and L SL squat  at least 3/5 times with no UE and no evidence of knee valgus to demo improved functional strength of LLE and decrese risk for reinjury.    Time  6    Period  Weeks    Status  New            Plan - 04/18/17 1727    Clinical Impression Statement  Focus continued this session on functional quad strengthening. Pt noted a certain range getting "stuck" and not being able to push back into standing position. Tolerated addition of functional squat to 18" step with manual assist at end range failure and power tower squats to train at available end range. Overall, she was able to maintain good knee alignment during therex and has good awareness of weight shift during squats. Continues to be challenged by current program presenting with muscle fatigue.     Rehab Potential  Good    PT Frequency  3x / week    PT Duration  6 weeks    PT Treatment/Interventions  ADLs/Self Care Home Management;Cryotherapy;Electrical Stimulation;Moist Heat;Ultrasound;DME Instruction;Gait training;Stair training;Functional mobility training;Therapeutic activities;Therapeutic exercise;Balance training;Neuromuscular re-education;Patient/family education;Manual techniques;Wheelchair mobility training;Passive range of motion;Scar mobilization;Dry needling;Energy conservation;Taping    PT Next Visit Plan  Follow [facility] Orthopedic ACL Allograft Reconstruction Protocol; Continue quad strengthening with SAQ with weight, isokinetic biodex. Continue quad, HS, and hip strengthening. Ensure pt not going into valgus with strengthening activities; begin SLS on foam with perturbations/rebounder/star drill, etc. to promote proprioception and dynamic stability    PT Home Exercise Plan  11/21: told pt to discontinue initial HEP but to continue TKE, SAQ and add heel taps on step, sidestep with constant tension, wall squats and wall sits for strengthening    Consulted and Agree with Plan of Care  Patient       Patient will benefit from  skilled therapeutic intervention in order to improve the following deficits and impairments:  Abnormal gait, Decreased activity tolerance, Decreased balance, Decreased endurance, Decreased mobility, Decreased range of motion, Decreased strength, Difficulty walking, Hypomobility, Increased edema, Increased muscle spasms, Increased fascial restricitons, Impaired flexibility, Pain  Visit Diagnosis: Acute pain of left knee  Stiffness of left knee  Muscle weakness (generalized)  Localized edema  Difficulty in walking, not elsewhere classified  Acute pain of right knee     Problem List Patient Active Problem List   Diagnosis Date Noted  . Hypercholesteremia 10/09/2016  . Immunization refused 09/11/2016  . Intermittent asthma without complication 69/62/9528  . Well woman exam with routine gynecological exam 01/13/2016  . Contraceptive management 03/31/2014  .  Class 2 obesity due to excess calories without serious comorbidity with body mass index (BMI) of 36.0 to 36.9 in adult 10/14/2013  . Migraines   . HTN (hypertension)   . Multiple allergies    Starr Lake PT, DPT 5:29 PM, 04/18/17 [place] [address] [place], [place], [address] Phone: [phone]   Fax:  [phone]  Name: Deanna Friedman MRN: [id number] Date of Birth: [birth year]

## 2017-04-19 ENCOUNTER — Ambulatory Visit (HOSPITAL_COMMUNITY): Payer: BC Managed Care – PPO

## 2017-04-19 ENCOUNTER — Encounter (HOSPITAL_COMMUNITY): Payer: BC Managed Care – PPO

## 2017-04-22 ENCOUNTER — Other Ambulatory Visit: Payer: Self-pay

## 2017-04-22 ENCOUNTER — Encounter: Payer: Self-pay | Admitting: Adult Health

## 2017-04-22 ENCOUNTER — Ambulatory Visit (HOSPITAL_COMMUNITY): Payer: BC Managed Care – PPO | Attending: Specialist

## 2017-04-22 ENCOUNTER — Encounter (HOSPITAL_COMMUNITY): Payer: Self-pay

## 2017-04-22 ENCOUNTER — Ambulatory Visit: Payer: BC Managed Care – PPO | Admitting: Adult Health

## 2017-04-22 VITALS — BP 110/70 | HR 97 | Ht 64.0 in | Wt 197.0 lb

## 2017-04-22 DIAGNOSIS — R600 Localized edema: Secondary | ICD-10-CM | POA: Insufficient documentation

## 2017-04-22 DIAGNOSIS — Z3009 Encounter for other general counseling and advice on contraception: Secondary | ICD-10-CM | POA: Diagnosis not present

## 2017-04-22 DIAGNOSIS — M25662 Stiffness of left knee, not elsewhere classified: Secondary | ICD-10-CM | POA: Insufficient documentation

## 2017-04-22 DIAGNOSIS — M6281 Muscle weakness (generalized): Secondary | ICD-10-CM | POA: Diagnosis present

## 2017-04-22 DIAGNOSIS — R262 Difficulty in walking, not elsewhere classified: Secondary | ICD-10-CM | POA: Insufficient documentation

## 2017-04-22 DIAGNOSIS — M25562 Pain in left knee: Secondary | ICD-10-CM | POA: Insufficient documentation

## 2017-04-22 NOTE — Progress Notes (Signed)
Subjective:     Patient ID: Deanna Friedman, female   DOB: [birth year], 41 y.o.   MRN: [id number]  HPI Deanna Friedman is a 41 year old white female, married, on depo, in to talk tubal sterilization.She can't lose weight on depo and has night sweats and body itches when does have period.Her next depo due 05/10/17.   Review of Systems Can't lose weight on depo Body itches with period Night sweats    Reviewed past medical,surgical, social and family history. Reviewed medications and allergies.  Objective:   Physical Exam BP 110/70 (BP Location: Left Arm, Patient Position: Sitting, Cuff Size: Large)   Pulse 97   Ht 5\' 4"  (1.626 m)   Wt 197 lb (89.4 kg)   BMI 33.81 kg/m  Talk only. Discussed tubal ligation options and ablation to control period, and she wants to proceed.    Assessment:       ICD-10-CM   1. Sterilization education Z30.09       Plan:     Return in 1 week for pre op with Dr Despina HiddenEure for tubal and ablation

## 2017-04-22 NOTE — Patient Instructions (Signed)
Endometrial Ablation Endometrial ablation is a procedure that destroys the thin inner layer of the lining of the uterus (endometrium). This procedure may be done:  To stop heavy periods.  To stop bleeding that is causing anemia.  To control irregular bleeding.  To treat bleeding caused by small tumors (fibroids) in the endometrium.  This procedure is often an alternative to major surgery, such as removal of the uterus and cervix (hysterectomy). As a result of this procedure:  You may not be able to have children. However, if you are premenopausal (you have not gone through menopause): ? You may still have a small chance of getting pregnant. ? You will need to use a reliable method of birth control after the procedure to prevent pregnancy.  You may stop having a menstrual period, or you may have only a small amount of bleeding during your period. Menstruation may return several years after the procedure.  Tell a health care provider about:  Any allergies you have.  All medicines you are taking, including vitamins, herbs, eye drops, creams, and over-the-counter medicines.  Any problems you or family members have had with the use of anesthetic medicines.  Any blood disorders you have.  Any surgeries you have had.  Any medical conditions you have. What are the risks? Generally, this is a safe procedure. However, problems may occur, including:  A hole (perforation) in the uterus or bowel.  Infection of the uterus, bladder, or vagina.  Bleeding.  Damage to other structures or organs.  An air bubble in the lung (air embolus).  Problems with pregnancy after the procedure.  Failure of the procedure.  Decreased ability to diagnose cancer in the endometrium.  What happens before the procedure?  You will have tests of your endometrium to make sure there are no pre-cancerous cells or cancer cells present.  You may have an ultrasound of the uterus.  You may be given  medicines to thin the endometrium.  Ask your health care provider about: ? Changing or stopping your regular medicines. This is especially important if you take diabetes medicines or blood thinners. ? Taking medicines such as aspirin and ibuprofen. These medicines can thin your blood. Do not take these medicines before your procedure if your doctor tells you not to.  Plan to have someone take you home from the hospital or clinic. What happens during the procedure?  You will lie on an exam table with your feet and legs supported as in a pelvic exam.  To lower your risk of infection: ? Your health care team will wash or sanitize their hands and put on germ-free (sterile) gloves. ? Your genital area will be washed with soap.  An IV tube will be inserted into one of your veins.  You will be given a medicine to help you relax (sedative).  A surgical instrument with a light and camera (resectoscope) will be inserted into your vagina and moved into your uterus. This allows your surgeon to see inside your uterus.  Endometrial tissue will be removed using one of the following methods: ? Radiofrequency. This method uses a radiofrequency-alternating electric current to remove the endometrium. ? Cryotherapy. This method uses extreme cold to freeze the endometrium. ? Heated-free liquid. This method uses a heated saltwater (saline) solution to remove the endometrium. ? Microwave. This method uses high-energy microwaves to heat up the endometrium and remove it. ? Thermal balloon. This method involves inserting a catheter with a balloon tip into the uterus. The balloon tip is   filled with heated fluid to remove the endometrium. The procedure may vary among health care providers and hospitals. What happens after the procedure?  Your blood pressure, heart rate, breathing rate, and blood oxygen level will be monitored until the medicines you were given have worn off.  As tissue healing occurs, you may  notice vaginal bleeding for 4-6 weeks after the procedure. You may also experience: ? Cramps. ? Thin, watery vaginal discharge that is light pink or brown in color. ? A need to urinate more frequently than usual. ? Nausea.  Do not drive for 24 hours if you were given a sedative.  Do not have sex or insert anything into your vagina until your health care provider approves. Summary  Endometrial ablation is done to treat the many causes of heavy menstrual bleeding.  The procedure may be done only after medications have been tried to control the bleeding.  Plan to have someone take you home from the hospital or clinic. This information is not intended to replace advice given to you by your health care provider. Make sure you discuss any questions you have with your health care provider. Document Released: 03/16/2004 Document Revised: 05/24/2016 Document Reviewed: 05/24/2016 Elsevier Interactive Patient Education  2017 [employer] F?u in 1 week for pre op

## 2017-04-22 NOTE — Therapy (Signed)
[place], [place], [address] Phone: [phone]   Fax:  [phone]  Physical Therapy Treatment  Patient Details  Name: Deanna Friedman MRN: [id number] Date of Birth: 05-31-1975 Referring Provider: Frederich Balding, MD   Encounter Date: 04/22/2017  PT End of Session - 04/22/17 1525    Visit Number  26    Number of Visits  37    Date for PT Re-Evaluation  04/05/17    Authorization Type  [place] Time Period  03/15/17 to 04/26/17    PT Start Time  1520    PT Stop Time  1605    PT Time Calculation (min)  45 min    Equipment Utilized During Treatment  Left knee immobilizer    Activity Tolerance  Patient tolerated treatment well    Behavior During Therapy  [facility] for tasks assessed/performed       Past Medical History:  Diagnosis Date  . ADHD (attention deficit hyperactivity disorder)   . Allergy    food, meds, mold, dust  . Anemia   . Asthma   . Blood transfusion without reported diagnosis    childbirth  . Body aches 11/29/2014  . BV (bacterial vaginosis) 10/14/2013  . Contraceptive management 03/31/2014  . Decreased sex drive 09/20/8880  . Folliculitis 8/0/0349  . Heart murmur   . HTN (hypertension)   . Hypercholesteremia 10/09/2016  . Migraines   . Multiple allergies   . Obesity 10/14/2013  . Vaginal itching 12/30/2014    Past Surgical History:  Procedure Laterality Date  . ANTERIOR CRUCIATE LIGAMENT REPAIR Left 01/29/2017  . BREAST LUMPECTOMY Right    fibroadenoma  . DILATION AND CURETTAGE OF UTERUS    . KNEE ARTHROSCOPY      There were no vitals filed for this visit.  Subjective Assessment - 04/22/17 1523    Subjective  Pt reports that her L hip has been bothering her on and off since her last treatment session. She is unsure of what caused it or what aggravates it. She states that her hip hurting caused her to walk funny on the L knee.     Currently in Pain?  Yes    Pain Score  2     Pain Location  Knee    Pain Orientation  Left    Pain Descriptors / Indicators  Aching    Pain Type  Surgical pain    Pain Onset  More than a month ago    Pain Frequency  Intermittent    Aggravating Factors   being up on it for a long time    Pain Relieving Factors  ice, rest, elevation, meds    Effect of Pain on Daily Activities  increases            [facility] Adult PT Treatment/Exercise - 04/22/17 0001      Knee/Hip Exercises: Aerobic   Stationary Bike  x3 mins at beginning of session for w/u      Knee/Hip Exercises: Machines for Strengthening   Cybex Knee Extension  LLE only, 2 plates x10 reps, x5 reps with 5" iso holds at full ext, x5 reps with 5" iso holds at ~60 deg ext    Other Machine  Power tower 29 deg: L single leg leg press x5 reps, x5 reps with 5" isometric holds at ~60 deg knee flexion      Knee/Hip Exercises: Standing   Heel Raises  Left;2 sets;15  reps    Heel Raises Limitations  single leg    Forward Lunges  Both;15 reps    Forward Lunges Limitations  on BOSU, soft side up    Side Lunges  Both;1 set;15 reps    Side Lunges Limitations  on BOSU, soft side up    Step Down  Left;2 sets;10 reps;Step Height: 6"    Step Down Limitations  lateral eccentric step downs bil 8# DB    Lunge Walking - Round Trips  functional squat to 18" box with two folded towels x15 reps total (multiple rest breaks during the 1 set)    SLS  on foam +RNT with purple band 4 rounds x10-15"     SLS with Vectors  star drill on foam x5RT, LLE    Walking with Sports Cord  4-way resisted walking with purple band 71f x1RT each    Other Standing Knee Exercises  squatting on BOSU (soft side down) x10 reps           PT Education - 04/22/17 1525    Education provided  Yes    Education Details  exercise technique, hip pain was likely muscle fatigue/soreness/DOMS but will continue to assess during sessions    Person(s) Educated  Patient    Methods  Explanation;Demonstration     Comprehension  Verbalized understanding;Returned demonstration       PT Short Term Goals - 03/15/17 0817      PT SHORT TERM GOAL #1   Title  Pt will be independent with HEP and perform consistently in order to maximize return to PLOF.    Time  3    Period  Weeks    Status  Achieved      PT SHORT TERM GOAL #2   Title  Pt will have improved L knee AROM to 5-100 deg to decrease pain and maximize overall function.    Baseline  10/26: 0-112    Time  3    Period  Weeks    Status  Achieved      PT SHORT TERM GOAL #3   Title  Pt will have improved MMT of hip musculature to 5/5 to maximize gait.     Time  3    Period  Weeks    Status  Achieved      PT SHORT TERM GOAL #4   Title  Pt will have decreased swelling to at least 15" on the L at joint line to decrease pain and maximize ROM.    Baseline  10/26" 14.5" joint line    Time  3    Period  Weeks    Status  Achieved        PT Long Term Goals - 03/15/17 07017     PT LONG TERM GOAL #1   Title  Pt will have improved L knee AROM to at least 0-120 deg to demo improved overall function and maximize sitting tolerance, gait, and stair ambulation    Baseline  10/26: 0-112    Time  6    Period  Weeks    Status  Partially Met      PT LONG TERM GOAL #2   Title  Pt will have 5/5 L knee MMT to maximize gait and return to PLOF.    Baseline  10/26: 4+/5 quad, 4-/5 HS    Time  6    Period  Weeks    Status  Revised      PT LONG TERM GOAL #3  Title  Pt will be able to perform SLS for at least 10 sec on LLE to demo improved function and maximize gait.     Baseline  10/26: 15 sec     Time  6    Period  Weeks    Status  Achieved      PT LONG TERM GOAL #4   Title  Pt will be able to perform squatting with no knee pain and proper mechanics, and be able to lift at leat 20# from floor to chest in order to maximize ability to perform work and house/yard duties.     Time  6    Period  Weeks    Status  New    Target Date  04/26/17      PT  LONG TERM GOAL #5   Title  Pt will report being able to return to her regular gym/exercise routine with no knee pain in order to maximize return to PLOF.     Time  6    Period  Weeks    Status  New      Additional Long Term Goals   Additional Long Term Goals  Yes      PT LONG TERM GOAL #6   Title  Pt will be able to ambulate a flight of stairs with no handrail, good eccentric control, and no knee pain to demonstrate improved functional knee strength and maximize work and community access.     Time  6    Period  Weeks    Status  New      PT LONG TERM GOAL #7   Title  Pt will be able to perform L SL sit <> stand and L SL squat at least 3/5 times with no UE and no evidence of knee valgus to demo improved functional strength of LLE and decrese risk for reinjury.    Time  6    Period  Weeks    Status  New            Plan - 04/22/17 1621    Clinical Impression Statement  Continued with strengthening and added prioprioceptive and light neuromuscular reactive training this date. Performed functional squats at beginning of session to avoid fatigue, however, pt continues to squat down to approximately 60 deg knee flexion and then she "gets stuck/gives out;" no pain when this occurs. This inability to squat lower without control is likely due to significant quad weakness. Progressed strengthening and began proprioceptive and light neuromuscular reactive training this date. Pt noticably fatigued by EOS, but no knee or hip pain reported.    Rehab Potential  Good    PT Frequency  3x / week    PT Duration  6 weeks    PT Treatment/Interventions  ADLs/Self Care Home Management;Cryotherapy;Electrical Stimulation;Moist Heat;Ultrasound;DME Instruction;Gait training;Stair training;Functional mobility training;Therapeutic activities;Therapeutic exercise;Balance training;Neuromuscular re-education;Patient/family education;Manual techniques;Wheelchair mobility training;Passive range of motion;Scar  mobilization;Dry needling;Energy conservation;Taping    PT Next Visit Plan  Follow [facility] Orthopedic ACL Allograft Reconstruction Protocol; Continue quad strengthening with SAQ with weight, isokinetic biodex. Continue quad, HS, and hip strengthening. Ensure pt not going into valgus with strengthening activities; continue SLS on foam with perturbations/rebounder/star drill, etc. to promote proprioception and dynamic stability    PT Home Exercise Plan  11/21: told pt to discontinue initial HEP but to continue TKE, SAQ and add heel taps on step, sidestep with constant tension, wall squats and wall sits for strengthening    Consulted and Agree with Plan of  Care  Patient       Patient will benefit from skilled therapeutic intervention in order to improve the following deficits and impairments:  Abnormal gait, Decreased activity tolerance, Decreased balance, Decreased endurance, Decreased mobility, Decreased range of motion, Decreased strength, Difficulty walking, Hypomobility, Increased edema, Increased muscle spasms, Increased fascial restricitons, Impaired flexibility, Pain  Visit Diagnosis: Acute pain of left knee  Stiffness of left knee  Muscle weakness (generalized)  Localized edema  Difficulty in walking, not elsewhere classified     Problem List Patient Active Problem List   Diagnosis Date Noted  . Hypercholesteremia 10/09/2016  . Immunization refused 09/11/2016  . Intermittent asthma without complication 54/49/2010  . Well woman exam with routine gynecological exam 01/13/2016  . Contraceptive management 03/31/2014  . Class 2 obesity due to excess calories without serious comorbidity with body mass index (BMI) of 36.0 to 36.9 in adult 10/14/2013  . Migraines   . HTN (hypertension)   . Multiple allergies         Deanna Friedman PT, DPT  [place] [address] [place], [place], [address] Phone: [phone]   Fax:   [phone]  Name: Deanna Friedman MRN: [id number] Date of Birth: 07/03/1975

## 2017-04-24 ENCOUNTER — Telehealth (HOSPITAL_COMMUNITY): Payer: Self-pay | Admitting: Family Medicine

## 2017-04-24 ENCOUNTER — Ambulatory Visit (HOSPITAL_COMMUNITY): Payer: BC Managed Care – PPO | Admitting: Physical Therapy

## 2017-04-24 DIAGNOSIS — M25562 Pain in left knee: Secondary | ICD-10-CM | POA: Diagnosis not present

## 2017-04-24 DIAGNOSIS — M6281 Muscle weakness (generalized): Secondary | ICD-10-CM

## 2017-04-24 DIAGNOSIS — M25662 Stiffness of left knee, not elsewhere classified: Secondary | ICD-10-CM

## 2017-04-24 NOTE — Telephone Encounter (Signed)
04/24/17  pt asked that this appt be cancelled but no reason given

## 2017-04-24 NOTE — Therapy (Signed)
[place], [place], [address] Phone: [phone]   Fax:  [phone]  Physical Therapy Treatment  Patient Details  Name: Deanna Friedman MRN: [id number] Date of Birth: April 26, 1976 Referring Provider: Frederich Balding, MD   Encounter Date: 04/24/2017  PT End of Session - 04/24/17 1640    Visit Number  27    Number of Visits  37    Date for PT Re-Evaluation  04/05/17    Authorization Type  [place] Time Period  03/15/17 to 04/26/17    PT Start Time  1600    PT Stop Time  1645    PT Time Calculation (min)  45 min    Equipment Utilized During Treatment  Left knee immobilizer    Activity Tolerance  Patient tolerated treatment well    Behavior During Therapy  [facility] for tasks assessed/performed       Past Medical History:  Diagnosis Date  . ADHD (attention deficit hyperactivity disorder)   . Allergy    food, meds, mold, dust  . Anemia   . Asthma   . Blood transfusion without reported diagnosis    childbirth  . Body aches 11/29/2014  . BV (bacterial vaginosis) 10/14/2013  . Contraceptive management 03/31/2014  . Decreased sex drive 5/99/3570  . Folliculitis 05/27/7937  . Heart murmur   . HTN (hypertension)   . Hypercholesteremia 10/09/2016  . Migraines   . Multiple allergies   . Obesity 10/14/2013  . Vaginal itching 12/30/2014    Past Surgical History:  Procedure Laterality Date  . ANTERIOR CRUCIATE LIGAMENT REPAIR Left 01/29/2017  . BREAST LUMPECTOMY Right    fibroadenoma  . DILATION AND CURETTAGE OF UTERUS    . KNEE ARTHROSCOPY      There were no vitals filed for this visit.  Subjective Assessment - 04/24/17 1745    Subjective  Pt states she's tired and not having a good day.  STates she was a little sore after last session.  STates she's been having times of instability, especially laterally, that doesn't hurt just scares her a little.     Currently in Pain?  Yes    Pain Score   1     Pain Location  Knee    Pain Orientation  Left    Pain Descriptors / Indicators  Aching                      [facility] Adult PT Treatment/Exercise - 04/24/17 0001      Knee/Hip Exercises: Aerobic   Stationary Bike  x3 mins at beginning of session for w/u      Knee/Hip Exercises: Machines for Strengthening   Cybex Knee Extension  LLE only, 2 plates x10 reps, x5 reps with 5" iso holds at full ext, x5 reps with 5" iso holds at ~60 deg ext    Other Machine  Power tower 29 deg: L single leg leg press x5 reps, x5 reps with 5" isometric holds at ~60 deg knee flexion      Knee/Hip Exercises: Standing   Heel Raises  Left;2 sets;15 reps    Heel Raises Limitations  single leg    Forward Lunges  Both;15 reps    Forward Lunges Limitations  on BOSU, soft side up    Side Lunges  Both;1 set;15 reps    Side Lunges Limitations  on BOSU, soft side up    Functional Squat  1  set;10 reps    Functional Squat Limitations  front loaded with 8# DB    Lunge Walking - Round Trips  functional squat to 18" box with two folded towels x10 reps total (multiple rest breaks during the 1 set) only able to get 5 quality squats out of 10    SLS with Vectors  star drill on foam x5RT, LLE    Walking with Sports Cord  4-way resisted walking with purple band 28f x1RT each    Other Standing Knee Exercises  squatting on BOSU (soft side down) x10 reps               PT Short Term Goals - 03/15/17 0817      PT SHORT TERM GOAL #1   Title  Pt will be independent with HEP and perform consistently in order to maximize return to PLOF.    Time  3    Period  Weeks    Status  Achieved      PT SHORT TERM GOAL #2   Title  Pt will have improved L knee AROM to 5-100 deg to decrease pain and maximize overall function.    Baseline  10/26: 0-112    Time  3    Period  Weeks    Status  Achieved      PT SHORT TERM GOAL #3   Title  Pt will have improved MMT of hip musculature to 5/5 to maximize gait.     Time   3    Period  Weeks    Status  Achieved      PT SHORT TERM GOAL #4   Title  Pt will have decreased swelling to at least 15" on the L at joint line to decrease pain and maximize ROM.    Baseline  10/26" 14.5" joint line    Time  3    Period  Weeks    Status  Achieved        PT Long Term Goals - 03/15/17 01610     PT LONG TERM GOAL #1   Title  Pt will have improved L knee AROM to at least 0-120 deg to demo improved overall function and maximize sitting tolerance, gait, and stair ambulation    Baseline  10/26: 0-112    Time  6    Period  Weeks    Status  Partially Met      PT LONG TERM GOAL #2   Title  Pt will have 5/5 L knee MMT to maximize gait and return to PLOF.    Baseline  10/26: 4+/5 quad, 4-/5 HS    Time  6    Period  Weeks    Status  Revised      PT LONG TERM GOAL #3   Title  Pt will be able to perform SLS for at least 10 sec on LLE to demo improved function and maximize gait.     Baseline  10/26: 15 sec     Time  6    Period  Weeks    Status  Achieved      PT LONG TERM GOAL #4   Title  Pt will be able to perform squatting with no knee pain and proper mechanics, and be able to lift at leat 20# from floor to chest in order to maximize ability to perform work and house/yard duties.     Time  6    Period  Weeks    Status  New  Target Date  04/26/17      PT LONG TERM GOAL #5   Title  Pt will report being able to return to her regular gym/exercise routine with no knee pain in order to maximize return to PLOF.     Time  6    Period  Weeks    Status  New      Additional Long Term Goals   Additional Long Term Goals  Yes      PT LONG TERM GOAL #6   Title  Pt will be able to ambulate a flight of stairs with no handrail, good eccentric control, and no knee pain to demonstrate improved functional knee strength and maximize work and community access.     Time  6    Period  Weeks    Status  New      PT LONG TERM GOAL #7   Title  Pt will be able to perform L SL  sit <> stand and L SL squat at least 3/5 times with no UE and no evidence of knee valgus to demo improved functional strength of LLE and decrese risk for reinjury.    Time  6    Period  Weeks    Status  New            Plan - 04/24/17 1641    Clinical Impression Statement  Focus remains on proprioception and neuromuscular training. completed squats initially, however pt unable to complete more than 10reps of squat with 8# weights due to discomfort and increasing instability.  Pt able to complete all other exercises/activites wtihout c/o pain or difficulty.      Rehab Potential  Good    PT Frequency  3x / week    PT Duration  6 weeks    PT Treatment/Interventions  ADLs/Self Care Home Management;Cryotherapy;Electrical Stimulation;Moist Heat;Ultrasound;DME Instruction;Gait training;Stair training;Functional mobility training;Therapeutic activities;Therapeutic exercise;Balance training;Neuromuscular re-education;Patient/family education;Manual techniques;Wheelchair mobility training;Passive range of motion;Scar mobilization;Dry needling;Energy conservation;Taping    PT Next Visit Plan  Follow [facility] Orthopedic ACL Allograft Reconstruction Protocol.  Re-evaluate next session.    PT Home Exercise Plan  11/21: told pt to discontinue initial HEP but to continue TKE, SAQ and add heel taps on step, sidestep with constant tension, wall squats and wall sits for strengthening    Consulted and Agree with Plan of Care  Patient       Patient will benefit from skilled therapeutic intervention in order to improve the following deficits and impairments:  Abnormal gait, Decreased activity tolerance, Decreased balance, Decreased endurance, Decreased mobility, Decreased range of motion, Decreased strength, Difficulty walking, Hypomobility, Increased edema, Increased muscle spasms, Increased fascial restricitons, Impaired flexibility, Pain  Visit Diagnosis: Acute pain of left knee  Stiffness of left  knee  Muscle weakness (generalized)     Problem List Patient Active Problem List   Diagnosis Date Noted  . Hypercholesteremia 10/09/2016  . Immunization refused 09/11/2016  . Intermittent asthma without complication 73/41/9379  . Well woman exam with routine gynecological exam 01/13/2016  . Contraceptive management 03/31/2014  . Class 2 obesity due to excess calories without serious comorbidity with body mass index (BMI) of 36.0 to 36.9 in adult 10/14/2013  . Migraines   . HTN (hypertension)   . Multiple allergies    Teena Irani, PTA/CLT [phone]  Teena Irani 04/24/2017, 5:46 PM  [place] [address] [place], [place], [address] Phone: [phone]   Fax:  [phone]  Name: Deanna Wiersma  Friedman MRN: [id number] Date of Birth: October 25, 1975

## 2017-04-26 ENCOUNTER — Encounter (HOSPITAL_COMMUNITY): Payer: BC Managed Care – PPO

## 2017-04-29 ENCOUNTER — Encounter: Payer: BC Managed Care – PPO | Admitting: Obstetrics & Gynecology

## 2017-04-30 ENCOUNTER — Encounter: Payer: Self-pay | Admitting: Obstetrics and Gynecology

## 2017-04-30 ENCOUNTER — Ambulatory Visit: Payer: BC Managed Care – PPO | Admitting: Obstetrics and Gynecology

## 2017-04-30 ENCOUNTER — Telehealth (HOSPITAL_COMMUNITY): Payer: Self-pay

## 2017-04-30 ENCOUNTER — Other Ambulatory Visit: Payer: Self-pay

## 2017-04-30 ENCOUNTER — Encounter (HOSPITAL_COMMUNITY): Payer: BC Managed Care – PPO

## 2017-04-30 VITALS — BP 122/82 | HR 92 | Ht 64.0 in | Wt 198.0 lb

## 2017-04-30 DIAGNOSIS — Z113 Encounter for screening for infections with a predominantly sexual mode of transmission: Secondary | ICD-10-CM | POA: Diagnosis not present

## 2017-04-30 DIAGNOSIS — Z01818 Encounter for other preprocedural examination: Secondary | ICD-10-CM | POA: Diagnosis not present

## 2017-04-30 NOTE — Telephone Encounter (Signed)
Patient canceled do to bad weather °

## 2017-04-30 NOTE — Progress Notes (Signed)
Preoperative History and Physical  Deanna BayleyJodi H Friedman is a 41 y.o. [id number] here for surgical management of desire for sterilization, and dysmenorrhea.   No significant preoperative concerns. Pro's and Con's for salpingectomy vs tubal ligation by clips or ligation discussed, pt chooses salpingectomy with it's slight reduction in lifetime cancer risk of ovary/tube. Menses are described as currently absent due to depo provera, but pt wishes to d/c Depo as it  Results in weight gain. Pt lost 20 pounds in 1+ month when  Proposed surgery: 1.  Laparoscopic bilateral salpingectomy                                  2.  Hysteroscopy D&C endometrial ablation  Past Medical History:  Diagnosis Date  . ADHD (attention deficit hyperactivity disorder)   . Allergy    food, meds, mold, dust  . Anemia   . Asthma   . Blood transfusion without reported diagnosis    childbirth  . Body aches 11/29/2014  . BV (bacterial vaginosis) 10/14/2013  . Contraceptive management 03/31/2014  . Decreased sex drive 4/54/09818/03/2015  . Folliculitis 01/27/2015  . Heart murmur   . HTN (hypertension)   . Hypercholesteremia 10/09/2016  . Migraines   . Multiple allergies   . Obesity 10/14/2013  . Vaginal itching 12/30/2014   Past Surgical History:  Procedure Laterality Date  . ANTERIOR CRUCIATE LIGAMENT REPAIR Left 01/29/2017  . BREAST LUMPECTOMY Right    fibroadenoma  . DILATION AND CURETTAGE OF UTERUS    . KNEE ARTHROSCOPY     OB History  Gravida Para Term Preterm AB Living  3 3 3     3   SAB TAB Ectopic Multiple Live Births          3    # Outcome Date GA Lbr Len/2nd Weight Sex Delivery Anes PTL Lv  3 Term     M Vag-Spont   LIV  2 Term     F Vag-Spont   LIV  1 Term     F Vag-Spont   LIV    Patient denies any other pertinent gynecologic issues.   Current Outpatient Medications on File Prior to Visit  Medication Sig Dispense Refill  . albuterol (PROVENTIL HFA;VENTOLIN HFA) 108 (90 BASE) MCG/ACT inhaler Inhale 2 puffs into  the lungs every 6 (six) hours as needed for wheezing or shortness of breath. 1 Inhaler 0  . beclomethasone (QVAR) 40 MCG/ACT inhaler Inhale 1 puff into the lungs 3 times/day as needed-between meals & bedtime.    Marland Kitchen. Bioflavonoid Products (VITAMIN C) CHEW Chew by mouth daily.    . Black Cohosh 540 MG CAPS Take by mouth daily.     . Cholecalciferol (VITAMIN D PO) Take by mouth daily.    . Cyanocobalamin (VITAMIN B12 PO) Take by mouth daily.    . diphenhydrAMINE (BENADRYL) 25 MG tablet Take 25 mg by mouth every 6 (six) hours as needed.    Marland Kitchen. EPINEPHrine (EPIPEN) 0.3 mg/0.3 mL IJ SOAJ injection Inject 0.3 mLs (0.3 mg total) into the muscle once. 1 Device 0  . hydrochlorothiazide (MICROZIDE) 12.5 MG capsule TAKE 1 CAPSULE(12.5 MG) BY MOUTH DAILY 30 capsule 6  . ibuprofen (ADVIL,MOTRIN) 800 MG tablet Take 800 mg by mouth as needed.     . Loratadine 10 MG CAPS Take by mouth daily.     Marland Kitchen. losartan (COZAAR) 50 MG tablet TAKE 1 TABLET(50 MG) BY MOUTH DAILY 30  tablet 3  . medroxyPROGESTERone (DEPO-PROVERA) 150 MG/ML injection Inject 1 mL (150 mg total) into the muscle every 3 (three) months. 1 mL 4  . Melatonin 3 MG CAPS Take by mouth daily.     . meperidine (DEMEROL) 50 MG tablet 50 mg as needed.   0  . mometasone (ELOCON) 0.1 % cream Apply 1 application topically daily. (Patient taking differently: Apply 1 application topically as needed. ) 45 g 1  . nystatin-triamcinolone (MYCOLOG II) cream Apply 1 application topically 2 (two) times daily. (Patient taking differently: Apply 1 application topically as needed. ) 30 g 3  . traMADol (ULTRAM) 50 MG tablet Take 50 mg by mouth every 6 (six) hours as needed for pain.     Current Facility-Administered Medications on File Prior to Visit  Medication Dose Route Frequency Provider Last Rate Last Dose  . triamcinolone acetonide (KENALOG) 10 MG/ML injection 10 mg  10 mg Other Once Asencion IslamStover, Titorya, DPM       Allergies  Allergen Reactions  . Nutritional Supplements  Anaphylaxis    Pitted fruit, highly reactive to cherries  . Shellfish Allergy Hives  . Other Hives    Pitted Fruits  . Aloe Hives  . Cephalexin Other (See Comments)    Severe headache  . Coconut Fatty Acids Hives    Coconut eaten  . Dust Mite Extract Hives  . Food Hives    Tree nuts  . Percodan [Oxycodone-Aspirin] Other (See Comments)    Serious headache    Social History:   reports that she has quit smoking. Her smoking use included cigarettes. she has never used smokeless tobacco. She reports that she drinks alcohol. She reports that she does not use drugs.  Family History  Problem Relation Age of Onset  . Hypertension Mother   . Heart disease Mother   . Alcohol abuse Mother   . Arthritis Mother   . Asthma Mother   . Depression Mother   . Alcohol abuse Maternal Grandfather   . Hypertension Paternal Grandmother   . Diabetes Paternal Grandmother   . Cancer Paternal Grandfather        lung  . Graves' disease Sister   . Other Maternal Grandmother        was murdered  . Early death Maternal Grandmother   . Alcohol abuse Father   . Arthritis Father     Review of Systems: Noncontributory  PHYSICAL EXAM: Blood pressure 122/82, pulse 92, height 5\' 4"  (1.626 m), weight 198 lb (89.8 kg). General appearance - alert, well appearing, and in no distress Chest - clear to auscultation, no wheezes, rales or rhonchi, symmetric air entry Heart - normal rate and regular rhythm Abdomen - soft, nontender, nondistended, no masses or organomegaly                     Abd umbilicus has a 2 cm umbilical hernia that is tender , but reducible. Pelvic - examination EFG Normal, vagina:parous introitus, moderate rectocele, not symptomatic, uterus nomral sized adnexa neogatice Extremities - peripheral pulses normal, no pedal edema, no clubbing or cyanosis  Labs: No results found for this or any previous visit (from the past 336 hour(s)).  Imaging Studies: No results  found.  Assessment: Patient Active Problem List   Diagnosis Date Noted  . Hypercholesteremia 10/09/2016  . Immunization refused 09/11/2016  . Intermittent asthma without complication 09/11/2016  . Well woman exam with routine gynecological exam 01/13/2016  . Contraceptive management 03/31/2014  . Class 2  obesity due to excess calories without serious comorbidity with body mass index (BMI) of 36.0 to 36.9 in adult 10/14/2013  . Migraines   . HTN (hypertension)   . Multiple allergies   DESIRE FOR PERMANENT STERILIZATION, DYSMENORRHEA PELVIC RELAXATION  ASSYMPTOMATIC UMBILICAL HERN9A./  Plan: Patient will undergo surgical management with HYSTEROSCOPY, dILATON AND CURETTAGE, LAPAROSCOPIC BILATERAL SALPINGECTOMY, UMBILICAL HERNIORAPHY.  TO THE OR FOR ;;LAPAROSCOPY .    Marland Kitchenmec 04/30/2017 2:55 PM

## 2017-04-30 NOTE — Telephone Encounter (Signed)
Patient cancel her appt do to bad weather.

## 2017-04-30 NOTE — Patient Instructions (Signed)
Deanna Friedman  04/30/2017     @PREFPERIOPPHARMACY @   Your procedure is scheduled on  05/07/2017   Report to [facility] at   835   A.M.  Call this number if you have problems the morning of surgery:  [phone]   Remember:  Do not eat food or drink liquids after midnight.  Take these medicines the morning of surgery with A SIP OF WATER  Losartan, microzide, loratadine, demerol or ultram (if needed). Use your inhalers before you come and bring them with you.   Do not wear jewelry, make-up or nail polish.  Do not wear lotions, powders, or perfumes, or deodorant.  Do not shave 48 hours prior to surgery.  Men may shave face and neck.  Do not bring valuables to the hospital.  [facility] is not responsible for any belongings or valuables.  Contacts, dentures or bridgework may not be worn into surgery.  Leave your suitcase in the car.  After surgery it may be brought to your room.  For patients admitted to the hospital, discharge time will be determined by your treatment team.  Patients discharged the day of surgery will not be allowed to drive home.   Name and phone number of your driver:   family Special instructions:  None  Please read over the following fact sheets that you were given. Anesthesia Post-op Instructions and Care and Recovery After Surgery       Dilation and Curettage or Vacuum Curettage Dilation and curettage (D&C) and vacuum curettage are minor procedures. A D&C involves stretching (dilation) the cervix and scraping (curettage) the inside lining of the uterus (endometrium). During a D&C, tissue is gently scraped from the endometrium, starting from the top portion of the uterus down to the lowest part of the uterus (cervix). During a vacuum curettage, the lining and tissue in the uterus are removed with the use of gentle suction. Curettage may be performed to either diagnose or treat a problem. As a diagnostic procedure,  curettage is performed to examine tissues from the uterus. A diagnostic curettage may be done if you have:  Irregular bleeding in the uterus.  Bleeding with the development of clots.  Spotting between menstrual periods.  Prolonged menstrual periods or other abnormal bleeding.  Bleeding after menopause.  No menstrual period (amenorrhea).  A change in size and shape of the uterus.  Abnormal endometrial cells discovered during a Pap test.  As a treatment procedure, curettage may be performed for the following reasons:  Removal of an IUD (intrauterine device).  Removal of retained placenta after giving birth.  Abortion.  Miscarriage.  Removal of endometrial polyps.  Removal of uncommon types of noncancerous lumps (fibroids).  Tell a health care provider about:  Any allergies you have, including allergies to prescribed medicine or latex.  All medicines you are taking, including vitamins, herbs, eye drops, creams, and over-the-counter medicines. This is especially important if you take any blood-thinning medicine. Bring a list of all of your medicines to your appointment.  Any problems you or family members have had with anesthetic medicines.  Any blood disorders you have.  Any surgeries you have had.  Your medical history and any medical conditions you have.  Whether you are pregnant or may be pregnant.  Recent vaginal infections you have had.  Recent menstrual periods, bleeding problems you have had, and what form of birth  control (contraception) you use. What are the risks? Generally, this is a safe procedure. However, problems may occur, including:  Infection.  Heavy vaginal bleeding.  Allergic reactions to medicines.  Damage to the cervix or other structures or organs.  Development of scar tissue (adhesions) inside the uterus, which can cause abnormal amounts of menstrual bleeding. This may make it harder to get pregnant in the future.  A hole  (perforation) or puncture in the uterine wall. This is rare.  What happens before the procedure? Staying hydrated Follow instructions from your health care provider about hydration, which may include:  Up to 2 hours before the procedure - you may continue to drink clear liquids, such as water, clear fruit juice, black coffee, and plain tea.  Eating and drinking restrictions Follow instructions from your health care provider about eating and drinking, which may include:  8 hours before the procedure - stop eating heavy meals or foods such as meat, fried foods, or fatty foods.  6 hours before the procedure - stop eating light meals or foods, such as toast or cereal.  6 hours before the procedure - stop drinking milk or drinks that contain milk.  2 hours before the procedure - stop drinking clear liquids. If your health care provider told you to take your medicine(s) on the day of your procedure, take them with only a sip of water.  Medicines  Ask your health care provider about: ? Changing or stopping your regular medicines. This is especially important if you are taking diabetes medicines or blood thinners. ? Taking medicines such as aspirin and ibuprofen. These medicines can thin your blood. Do not take these medicines before your procedure if your health care provider instructs you not to.  You may be given antibiotic medicine to help prevent infection. General instructions  For 24 hours before your procedure, do not: ? Douche. ? Use tampons. ? Use medicines, creams, or suppositories in the vagina. ? Have sexual intercourse.  You may be given a pregnancy test on the day of the procedure.  Plan to have someone take you home from the hospital or clinic.  You may have a blood or urine sample taken.  If you will be going home right after the procedure, plan to have someone with you for 24 hours. What happens during the procedure?  To reduce your risk of infection: ? Your  health care team will wash or sanitize their hands. ? Your skin will be washed with soap.  An IV tube will be inserted into one of your veins.  You will be given one of the following: ? A medicine that numbs the area in and around the cervix (local anesthetic). ? A medicine to make you fall asleep (general anesthetic).  You will lie down on your back, with your feet in foot rests (stirrups).  The size and position of your uterus will be checked.  A lubricated instrument (speculum or Sims retractor) will be inserted into the back side of your vagina. The speculum will be used to hold apart the walls of your vagina so your health care provider can see your cervix.  A tool (tenaculum) will be attached to the lip of the cervix to stabilize it.  Your cervix will be softened and dilated. This may be done by: ? Taking a medicine. ? Having tapered dilators or thin rods (laminaria) or gradual widening instruments (tapered dilators) inserted into your cervix.  A small, sharp, curved instrument (curette) will be used to  scrape a small amount of tissue or cells from the endometrium or cervical canal. In some cases, gentle suction is applied with the curette. The curette will then be removed. The cells will be taken to a lab for testing. The procedure may vary among health care providers and hospitals. What happens after the procedure?  You may have mild cramping, backache, pain, and light bleeding or spotting. You may pass small blood clots from your vagina.  You may have to wear compression stockings. These stockings help to prevent blood clots and reduce swelling in your legs.  Your blood pressure, heart rate, breathing rate, and blood oxygen level will be monitored until the medicines you were given have worn off. Summary  Dilation and curettage (D&C) involves stretching (dilation) the cervix and scraping (curettage) the inside lining of the uterus (endometrium).  After the procedure, you  may have mild cramping, backache, pain, and light bleeding or spotting. You may pass small blood clots from your vagina.  Plan to have someone take you home from the hospital or clinic. This information is not intended to replace advice given to you by your health care provider. Make sure you discuss any questions you have with your health care provider. Document Released: 05/07/2005 Document Revised: 01/22/2016 Document Reviewed: 01/22/2016 Elsevier Interactive Patient Education  2018 [employer].  Dilation and Curettage or Vacuum Curettage, Care After These instructions give you information about caring for yourself after your procedure. Your doctor may also give you more specific instructions. Call your doctor if you have any problems or questions after your procedure. Follow these instructions at home: Activity  Do not drive or use heavy machinery while taking prescription pain medicine.  For 24 hours after your procedure, avoid driving.  Take short walks often, followed by rest periods. Ask your doctor what activities are safe for you. After one or two days, you may be able to return to your normal activities.  Do not lift anything that is heavier than 10 lb (4.5 kg) until your doctor approves.  For at least 2 weeks, or as long as told by your doctor: ? Do not douche. ? Do not use tampons. ? Do not have sex. General instructions  Take over-the-counter and prescription medicines only as told by your doctor. This is very important if you take blood thinning medicine.  Do not take baths, swim, or use a hot tub until your doctor approves. Take showers instead of baths.  Wear compression stockings as told by your doctor.  It is up to you to get the results of your procedure. Ask your doctor when your results will be ready.  Keep all follow-up visits as told by your doctor. This is important. Contact a doctor if:  You have very bad cramps that get worse or do not get better with  medicine.  You have very bad pain in your belly (abdomen).  You cannot drink fluids without throwing up (vomiting).  You get pain in a different part of the area between your belly and thighs (pelvis).  You have bad-smelling discharge from your vagina.  You have a rash. Get help right away if:  You are bleeding a lot from your vagina. A lot of bleeding means soaking more than one sanitary pad in an hour, for 2 hours in a row.  You have clumps of blood (blood clots) coming from your vagina.  You have a fever or chills.  Your belly feels very tender or hard.  You have chest  pain.  You have trouble breathing.  You cough up blood.  You feel dizzy.  You feel light-headed.  You pass out (faint).  You have pain in your neck or shoulder area. Summary  Take short walks often, followed by rest periods. Ask your doctor what activities are safe for you. After one or two days, you may be able to return to your normal activities.  Do not lift anything that is heavier than 10 lb (4.5 kg) until your doctor approves.  Do not take baths, swim, or use a hot tub until your doctor approves. Take showers instead of baths.  Contact your doctor if you have any symptoms of infection, like bad-smelling discharge from your vagina. This information is not intended to replace advice given to you by your health care provider. Make sure you discuss any questions you have with your health care provider. Document Released: 02/14/2008 Document Revised: 01/23/2016 Document Reviewed: 01/23/2016 Elsevier Interactive Patient Education  2017 [employer] Hysteroscopy Hysteroscopy is a procedure used for looking inside the womb (uterus). It may be done for various reasons, including:  To evaluate abnormal bleeding, fibroid (benign, noncancerous) tumors, polyps, scar tissue (adhesions), and possibly cancer of the uterus.  To look for lumps (tumors) and other uterine growths.  To look for causes of why  a woman cannot get pregnant (infertility), causes of recurrent loss of pregnancy (miscarriages), or a lost intrauterine device (IUD).  To perform a sterilization by blocking the fallopian tubes from inside the uterus.  In this procedure, a thin, flexible tube with a tiny light and camera on the end of it (hysteroscope) is used to look inside the uterus. A hysteroscopy should be done right after a menstrual period to be sure you are not pregnant. LET [facility] PROVIDER KNOW ABOUT:  Any allergies you have.  All medicines you are taking, including vitamins, herbs, eye drops, creams, and over-the-counter medicines.  Previous problems you or members of your family have had with the use of anesthetics.  Any blood disorders you have.  Previous surgeries you have had.  Medical conditions you have. RISKS AND COMPLICATIONS Generally, this is a safe procedure. However, as with any procedure, complications can occur. Possible complications include:  Putting a hole in the uterus.  Excessive bleeding.  Infection.  Damage to the cervix.  Injury to other organs.  Allergic reaction to medicines.  Too much fluid used in the uterus for the procedure.  BEFORE THE PROCEDURE  Ask your health care provider about changing or stopping any regular medicines.  Do not take aspirin or blood thinners for 1 week before the procedure, or as directed by your health care provider. These can cause bleeding.  If you smoke, do not smoke for 2 weeks before the procedure.  In some cases, a medicine is placed in the cervix the day before the procedure. This medicine makes the cervix have a larger opening (dilate). This makes it easier for the instrument to be inserted into the uterus during the procedure.  Do not eat or drink anything for at least 8 hours before the surgery.  Arrange for someone to take you home after the procedure. PROCEDURE  You may be given a medicine to relax you (sedative). You  may also be given one of the following: ? A medicine that numbs the area around the cervix (local anesthetic). ? A medicine that makes you sleep through the procedure (general anesthetic).  The hysteroscope is inserted through the vagina into the  uterus. The camera on the hysteroscope sends a picture to a TV screen. This gives the surgeon a good view inside the uterus.  During the procedure, air or a liquid is put into the uterus, which allows the surgeon to see better.  Sometimes, tissue is gently scraped from inside the uterus. These tissue samples are sent to a lab for testing. What to expect after the procedure  If you had a general anesthetic, you may be groggy for a couple hours after the procedure.  If you had a local anesthetic, you will be able to go home as soon as you are stable and feel ready.  You may have some cramping. This normally lasts for a couple days.  You may have bleeding, which varies from light spotting for a few days to menstrual-like bleeding for 3-7 days. This is normal.  If your test results are not back during the visit, make an appointment with your health care provider to find out the results. This information is not intended to replace advice given to you by your health care provider. Make sure you discuss any questions you have with your health care provider. Document Released: 08/13/2000 Document Revised: 10/13/2015 Document Reviewed: 12/04/2012 Elsevier Interactive Patient Education  2017 [employer] Inc. Hysteroscopy, Care After Refer to this sheet in the next few weeks. These instructions provide you with information on caring for yourself after your procedure. Your health care provider may also give you more specific instructions. Your treatment has been planned according to current medical practices, but problems sometimes occur. Call your health care provider if you have any problems or questions after your procedure. What can I expect after the  procedure? After your procedure, it is typical to have the following:  You may have some cramping. This normally lasts for a couple days.  You may have bleeding. This can vary from light spotting for a few days to menstrual-like bleeding for 3-7 days.  Follow these instructions at home:  Rest for the first 1-2 days after the procedure.  Only take over-the-counter or prescription medicines as directed by your health care provider. Do not take aspirin. It can increase the chances of bleeding.  Take showers instead of baths for 2 weeks or as directed by your health care provider.  Do not drive for 24 hours or as directed.  Do not drink alcohol while taking pain medicine.  Do not use tampons, douche, or have sexual intercourse for 2 weeks or until your health care provider says it is okay.  Take your temperature twice a day for 4-5 days. Write it down each time.  Follow your health care provider's advice about diet, exercise, and lifting.  If you develop constipation, you may: ? Take a mild laxative if your health care provider approves. ? Add bran foods to your diet. ? Drink enough fluids to keep your urine clear or pale yellow.  Try to have someone with you or available to you for the first 24-48 hours, especially if you were given a general anesthetic.  Follow up with your health care provider as directed. Contact a health care provider if:  You feel dizzy or lightheaded.  You feel sick to your stomach (nauseous).  You have abnormal vaginal discharge.  You have a rash.  You have pain that is not controlled with medicine. Get help right away if:  You have bleeding that is heavier than a normal menstrual period.  You have a fever.  You have increasing cramps or  pain, not controlled with medicine.  You have new belly (abdominal) pain.  You pass out.  You have pain in the tops of your shoulders (shoulder strap areas).  You have shortness of breath. This  information is not intended to replace advice given to you by your health care provider. Make sure you discuss any questions you have with your health care provider. Document Released: 02/25/2013 Document Revised: 10/13/2015 Document Reviewed: 12/04/2012 Elsevier Interactive Patient Education  2017 [employer]  Endometrial Ablation Endometrial ablation is a procedure that destroys the thin inner layer of the lining of the uterus (endometrium). This procedure may be done:  To stop heavy periods.  To stop bleeding that is causing anemia.  To control irregular bleeding.  To treat bleeding caused by small tumors (fibroids) in the endometrium.  This procedure is often an alternative to major surgery, such as removal of the uterus and cervix (hysterectomy). As a result of this procedure:  You may not be able to have children. However, if you are premenopausal (you have not gone through menopause): ? You may still have a small chance of getting pregnant. ? You will need to use a reliable method of birth control after the procedure to prevent pregnancy.  You may stop having a menstrual period, or you may have only a small amount of bleeding during your period. Menstruation may return several years after the procedure.  Tell a health care provider about:  Any allergies you have.  All medicines you are taking, including vitamins, herbs, eye drops, creams, and over-the-counter medicines.  Any problems you or family members have had with the use of anesthetic medicines.  Any blood disorders you have.  Any surgeries you have had.  Any medical conditions you have. What are the risks? Generally, this is a safe procedure. However, problems may occur, including:  A hole (perforation) in the uterus or bowel.  Infection of the uterus, bladder, or vagina.  Bleeding.  Damage to other structures or organs.  An air bubble in the lung (air embolus).  Problems with pregnancy after the  procedure.  Failure of the procedure.  Decreased ability to diagnose cancer in the endometrium.  What happens before the procedure?  You will have tests of your endometrium to make sure there are no pre-cancerous cells or cancer cells present.  You may have an ultrasound of the uterus.  You may be given medicines to thin the endometrium.  Ask your health care provider about: ? Changing or stopping your regular medicines. This is especially important if you take diabetes medicines or blood thinners. ? Taking medicines such as aspirin and ibuprofen. These medicines can thin your blood. Do not take these medicines before your procedure if your doctor tells you not to.  Plan to have someone take you home from the hospital or clinic. What happens during the procedure?  You will lie on an exam table with your feet and legs supported as in a pelvic exam.  To lower your risk of infection: ? Your health care team will wash or sanitize their hands and put on germ-free (sterile) gloves. ? Your genital area will be washed with soap.  An IV tube will be inserted into one of your veins.  You will be given a medicine to help you relax (sedative).  A surgical instrument with a light and camera (resectoscope) will be inserted into your vagina and moved into your uterus. This allows your surgeon to see inside your uterus.  Endometrial tissue  will be removed using one of the following methods: ? Radiofrequency. This method uses a radiofrequency-alternating electric current to remove the endometrium. ? Cryotherapy. This method uses extreme cold to freeze the endometrium. ? Heated-free liquid. This method uses a heated saltwater (saline) solution to remove the endometrium. ? Microwave. This method uses high-energy microwaves to heat up the endometrium and remove it. ? Thermal balloon. This method involves inserting a catheter with a balloon tip into the uterus. The balloon tip is filled with heated  fluid to remove the endometrium. The procedure may vary among health care providers and hospitals. What happens after the procedure?  Your blood pressure, heart rate, breathing rate, and blood oxygen level will be monitored until the medicines you were given have worn off.  As tissue healing occurs, you may notice vaginal bleeding for 4-6 weeks after the procedure. You may also experience: ? Cramps. ? Thin, watery vaginal discharge that is light pink or brown in color. ? A need to urinate more frequently than usual. ? Nausea.  Do not drive for 24 hours if you were given a sedative.  Do not have sex or insert anything into your vagina until your health care provider approves. Summary  Endometrial ablation is done to treat the many causes of heavy menstrual bleeding.  The procedure may be done only after medications have been tried to control the bleeding.  Plan to have someone take you home from the hospital or clinic. This information is not intended to replace advice given to you by your health care provider. Make sure you discuss any questions you have with your health care provider. Document Released: 03/16/2004 Document Revised: 05/24/2016 Document Reviewed: 05/24/2016 Elsevier Interactive Patient Education  2017 [employer]  Salpingectomy Salpingectomy, also called tubectomy, is the surgical removal of one of the fallopian tubes. The fallopian tubes are where eggs travel from the ovaries to the uterus. Removing one fallopian tube does not prevent you from becoming pregnant. It also does not cause problems with your menstrual periods. You may need a salpingectomy if you:  Have a fertilized egg that attaches to the fallopian tube (ectopic pregnancy), especially one that causes the tube to burst or tear (rupture).  Have an infected fallopian tube.  Have cancer of the fallopian tube or nearby organs.  Have had an ovary removed due to a cyst or tumor.  Have had your uterus  removed.  There are three different methods that can be used for a salpingectomy:  Open. This method involves making one large incision in your abdomen.  Laparoscopic. This method involves using a thin, lighted tube with a tiny camera on the end (laparoscope) to help perform the procedure. The laparoscope will allow your surgeon to make several small incisions in the abdomen instead of a large incision.  Robot-assisted: This method involves using a computer to control surgical instruments that are attached to robotic arms.  Tell a health care provider about:  Any allergies you have.  All medicines you are taking, including vitamins, herbs, eye drops, creams, and over-the-counter medicines.  Any problems you or family members have had with anesthetic medicines.  Any blood disorders you have.  Any surgeries you have had.  Any medical conditions you have.  Whether you are pregnant or may be pregnant. What are the risks? Generally, this is a safe procedure. However, problems may occur, including:  Infection.  Bleeding.  Allergic reactions to medicines.  Damage to other structures or organs.  Blood clots in the  legs or lungs.  What happens before the procedure? Staying hydrated Follow instructions from your health care provider about hydration, which may include:  Up to 2 hours before the procedure - you may continue to drink clear liquids, such as water, clear fruit juice, black coffee, and plain tea.  Eating and drinking restrictions Follow instructions from your health care provider about eating and drinking, which may include:  8 hours before the procedure - stop eating heavy meals or foods such as meat, fried foods, or fatty foods.  6 hours before the procedure - stop eating light meals or foods, such as toast or cereal.  6 hours before the procedure - stop drinking milk or drinks that contain milk.  2 hours before the procedure - stop drinking clear  liquids.  Medicines  Ask your health care provider about: ? Changing or stopping your regular medicines. This is especially important if you are taking diabetes medicines or blood thinners. ? Taking medicines such as aspirin and ibuprofen. These medicines can thin your blood. Do not take these medicines before your procedure if your health care provider instructs you not to.  You may be given antibiotic medicine to help prevent infection. General instructions  Do not smoke for at least 2 weeks before your procedure. If you need help quitting, ask your health care provider.  You may have an exam or tests, such as an electrocardiogram (ECG).  You may have a blood or urine sample taken.  Ask your health care provider: ? Whether you should stop removing hair from your surgical area. ? How your surgical site will be marked or identified.  You may be asked to shower with a germ-killing soap.  Plan to have someone take you home from the hospital or clinic.  If you will be going home right after the procedure, plan to have someone with you for 24 hours. What happens during the procedure?  To reduce your risk of infection: ? Your health care team will wash or sanitize their hands. ? Hair may be removed from the surgical area. ? Your skin will be washed with soap.  An IV tube will be inserted into one of your veins.  You will be given a medicine to make you fall asleep (general anesthetic). You may also be given a medicine to help you relax (sedative).  A thin tube (catheter) may be inserted through your urethra and into your bladder to drain urine during your procedure.  Depending on the type of procedure you are having, one incision or several small incisions will be made in your abdomen.  Your fallopian tube will be cut and removed from where it attaches to your uterus.  Your blood vessels will be clamped and tied to prevent excess bleeding.  The incision(s) in your abdomen will  be closed with stitches (sutures), staples, or skin glue.  A bandage (dressing) may be placed over your incision(s). The procedure may vary among health care providers and hospitals. What happens after the procedure?  Your blood pressure, heart rate, breathing rate, and blood oxygen level will be monitored until the medicines you were given have worn off.  You may continue to receive fluids and medicines through an IV tube.  You may continue to have a catheter draining your urine.  You may have to wear compression stockings. These stockings help to prevent blood clots and reduce swelling in your legs.  You will be given pain medicine as needed.  Do not drive for 24  hours if you received a sedative. Summary  Salpingectomy is a surgical procedure to remove one of the fallopian tubes.  The procedure may be done with an open incision, with a laparoscope, or with computer-controlled instruments.  Depending on the type of procedure you are having, one incision or several small incisions will be made in your abdomen.  Your blood pressure, heart rate, breathing rate, and blood oxygen level will be monitored until the medicines you were given have worn off.  Plan to have someone take you home from the hospital or clinic. This information is not intended to replace advice given to you by your health care provider. Make sure you discuss any questions you have with your health care provider. Document Released: 09/23/2008 Document Revised: 12/23/2015 Document Reviewed: 10/29/2012 Elsevier Interactive Patient Education  2018 [employer].  Salpingectomy, Care After This sheet gives you information about how to care for yourself after your procedure. Your health care provider may also give you more specific instructions. If you have problems or questions, contact your health care provider. What can I expect after the procedure? After your procedure, it is common to have:  Pain in your  abdomen.  Some occasional vaginal bleeding (spotting).  Tiredness.  Follow these instructions at home: Incision care   Keep your incision area and your bandage (dressing) clean and dry.  Follow instructions from your health care provider about how to take care of your incision. Make sure you: ? Wash your hands with soap and water before you change your dressing. If soap and water are not available, use hand sanitizer. ? Change your dressing astold by your health care provider. ? Leave stitches (sutures), staples, skin glue, or adhesive strips in place. These skin closures may need to stay in place for 2 weeks or longer. If adhesive strip edges start to loosen and curl up, you may trim the loose edges. Do not remove adhesive strips completely unless your health care provider tells you to do that.  Check your incision area every day for signs of infection. Check for: ? More redness, swelling, or pain. ? More fluid or blood. ? Warmth. ? Pus or a bad smell. Activity   Do not drive or use heavy machinery while taking prescription pain medicine.  Do not drive for 24 hours if you received a medicine to help you relax (sedative).  Rest as directed by your health care provider. Ask your health care provider what activities are safe for you. You should avoid: ? Lifting anything that is heavier than 10 lb (4.5 kg) until your health care provider approves. ? Activities that require a lot of energy.  Until your health care provider approves: ? Do not douche. ? Do not use tampons. ? Do not have sexual intercourse. General instructions  Take over-the-counter and prescription medicines only as told by your health care provider.  To prevent or treat constipation while you are taking prescription pain medicine, your health care provider may recommend that you: ? Drink enough fluid to keep your urine clear or pale yellow. ? Take over-the-counter or prescription medicines. ? Eat foods that  are high in fiber, such as fresh fruits and vegetables, whole grains, and beans. ? Limit foods that are high in fat and processed sugars, such as fried and sweet foods.  Do not take baths, swim, or use a hot tub until your health care provider approves. You may take showers.  Wear compression stockings as told by your health care provider. These  stockings help to prevent blood clots and reduce swelling in your legs.  Keep all follow-up visits as told by your health care provider. This is important. Contact a health care provider if:  You have: ? Pain when you urinate. ? More redness, swelling, or pain around your incision. ? More fluid or blood coming from your incision. ? Pus or a bad smell coming from your incision. ? A fever. ? Abdominal pain that gets worse or does not get better with medicine.  Your incision feels warm to the touch.  Your incision starts to break open.  You develop a rash.  You develop nausea and vomiting.  You feel light-headed. Get help right away if:  You develop pain in your chest or leg.  You develop shortness of breath.  You faint.  You have increased vaginal bleeding. This information is not intended to replace advice given to you by your health care provider. Make sure you discuss any questions you have with your health care provider. Document Released: 08/11/2010 Document Revised: 01/04/2016 Document Reviewed: 01/05/2016 Elsevier Interactive Patient Education  2018 [employer].  General Anesthesia, Adult General anesthesia is the use of medicines to make a person "go to sleep" (be unconscious) for a medical procedure. General anesthesia is often recommended when a procedure:  Is long.  Requires you to be still or in an unusual position.  Is major and can cause you to lose blood.  Is impossible to do without general anesthesia.  The medicines used for general anesthesia are called general anesthetics. In addition to making you sleep,  the medicines:  Prevent pain.  Control your blood pressure.  Relax your muscles.  Tell a health care provider about:  Any allergies you have.  All medicines you are taking, including vitamins, herbs, eye drops, creams, and over-the-counter medicines.  Any problems you or family members have had with anesthetic medicines.  Types of anesthetics you have had in the past.  Any bleeding disorders you have.  Any surgeries you have had.  Any medical conditions you have.  Any history of heart or lung conditions, such as heart failure, sleep apnea, or chronic obstructive pulmonary disease (COPD).  Whether you are pregnant or may be pregnant.  Whether you use tobacco, alcohol, marijuana, or street drugs.  Any history of [employer].  Any history of depression or anxiety. What are the risks? Generally, this is a safe procedure. However, problems may occur, including:  Allergic reaction to anesthetics.  Lung and heart problems.  Inhaling food or liquids from your stomach into your lungs (aspiration).  Injury to nerves.  Waking up during your procedure and being unable to move (rare).  Extreme agitation or a state of mental confusion (delirium) when you wake up from the anesthetic.  Air in the bloodstream, which can lead to stroke.  These problems are more likely to develop if you are having a major surgery or if you have an advanced medical condition. You can prevent some of these complications by answering all of your health care provider's questions thoroughly and by following all pre-procedure instructions. General anesthesia can cause side effects, including:  Nausea or vomiting  A sore throat from the breathing tube.  Feeling cold or shivery.  Feeling tired, washed out, or achy.  Sleepiness or drowsiness.  Confusion or agitation.  What happens before the procedure? Staying hydrated Follow instructions from your health care provider about hydration,  which may include:  Up to 2 hours before the procedure -  you may continue to drink clear liquids, such as water, clear fruit juice, black coffee, and plain tea.  Eating and drinking restrictions Follow instructions from your health care provider about eating and drinking, which may include:  8 hours before the procedure - stop eating heavy meals or foods such as meat, fried foods, or fatty foods.  6 hours before the procedure - stop eating light meals or foods, such as toast or cereal.  6 hours before the procedure - stop drinking milk or drinks that contain milk.  2 hours before the procedure - stop drinking clear liquids.  Medicines  Ask your health care provider about: ? Changing or stopping your regular medicines. This is especially important if you are taking diabetes medicines or blood thinners. ? Taking medicines such as aspirin and ibuprofen. These medicines can thin your blood. Do not take these medicines before your procedure if your health care provider instructs you not to. ? Taking new dietary supplements or medicines. Do not take these during the week before your procedure unless your health care provider approves them.  If you are told to take a medicine or to continue taking a medicine on the day of the procedure, take the medicine with sips of water. General instructions   Ask if you will be going home the same day, the following day, or after a longer hospital stay. ? Plan to have someone take you home. ? Plan to have someone stay with you for the first 24 hours after you leave the hospital or clinic.  For 3-6 weeks before the procedure, try not to use any tobacco products, such as cigarettes, chewing tobacco, and e-cigarettes.  You may brush your teeth on the morning of the procedure, but make sure to spit out the toothpaste. What happens during the procedure?  You will be given anesthetics through a mask and through an IV tube in one of your veins.  You may  receive medicine to help you relax (sedative).  As soon as you are asleep, a breathing tube may be used to help you breathe.  An anesthesia specialist will stay with you throughout the procedure. He or she will help keep you comfortable and safe by continuing to give you medicines and adjusting the amount of medicine that you get. He or she will also watch your blood pressure, pulse, and oxygen levels to make sure that the anesthetics do not cause any problems.  If a breathing tube was used to help you breathe, it will be removed before you wake up. The procedure may vary among health care providers and hospitals. What happens after the procedure?  You will wake up, often slowly, after the procedure is complete, usually in a recovery area.  Your blood pressure, heart rate, breathing rate, and blood oxygen level will be monitored until the medicines you were given have worn off.  You may be given medicine to help you calm down if you feel anxious or agitated.  If you will be going home the same day, your health care provider may check to make sure you can stand, drink, and urinate.  Your health care providers will treat your pain and side effects before you go home.  Do not drive for 24 hours if you received a sedative.  You may: ? Feel nauseous and vomit. ? Have a sore throat. ? Have mental slowness. ? Feel cold or shivery. ? Feel sleepy. ? Feel tired. ? Feel sore or achy, even in parts of  your body where you did not have surgery. This information is not intended to replace advice given to you by your health care provider. Make sure you discuss any questions you have with your health care provider. Document Released: 08/14/2007 Document Revised: 10/18/2015 Document Reviewed: 04/21/2015 Elsevier Interactive Patient Education  2018 [employer]. General Anesthesia, Adult, Care After These instructions provide you with information about caring for yourself after your procedure. Your  health care provider may also give you more specific instructions. Your treatment has been planned according to current medical practices, but problems sometimes occur. Call your health care provider if you have any problems or questions after your procedure. What can I expect after the procedure? After the procedure, it is common to have:  Vomiting.  A sore throat.  Mental slowness.  It is common to feel:  Nauseous.  Cold or shivery.  Sleepy.  Tired.  Sore or achy, even in parts of your body where you did not have surgery.  Follow these instructions at home: For at least 24 hours after the procedure:  Do not: ? Participate in activities where you could fall or become injured. ? Drive. ? Use heavy machinery. ? Drink alcohol. ? Take sleeping pills or medicines that cause drowsiness. ? Make important decisions or sign legal documents. ? Take care of children on your own.  Rest. Eating and drinking  If you vomit, drink water, juice, or soup when you can drink without vomiting.  Drink enough fluid to keep your urine clear or pale yellow.  Make sure you have little or no nausea before eating solid foods.  Follow the diet recommended by your health care provider. General instructions  Have a responsible adult stay with you until you are awake and alert.  Return to your normal activities as told by your health care provider. Ask your health care provider what activities are safe for you.  Take over-the-counter and prescription medicines only as told by your health care provider.  If you smoke, do not smoke without supervision.  Keep all follow-up visits as told by your health care provider. This is important. Contact a health care provider if:  You continue to have nausea or vomiting at home, and medicines are not helpful.  You cannot drink fluids or start eating again.  You cannot urinate after 8-12 hours.  You develop a skin rash.  You have fever.  You  have increasing redness at the site of your procedure. Get help right away if:  You have difficulty breathing.  You have chest pain.  You have unexpected bleeding.  You feel that you are having a life-threatening or urgent problem. This information is not intended to replace advice given to you by your health care provider. Make sure you discuss any questions you have with your health care provider. Document Released: 08/13/2000 Document Revised: 10/10/2015 Document Reviewed: 04/21/2015 Elsevier Interactive Patient Education  [employer].

## 2017-05-01 ENCOUNTER — Ambulatory Visit (HOSPITAL_COMMUNITY): Payer: BC Managed Care – PPO

## 2017-05-01 ENCOUNTER — Encounter: Payer: BC Managed Care – PPO | Admitting: Obstetrics and Gynecology

## 2017-05-01 ENCOUNTER — Telehealth (HOSPITAL_COMMUNITY): Payer: Self-pay

## 2017-05-01 NOTE — Telephone Encounter (Signed)
I called the patient's home number on file and spoke directly to her. I informed her we had openings this morning and afternoon for an earlier appointment time if she wanted to come in before her 4:45 PM appointment. She stated that she meant to call and cancel because she does not have a car with 4-wheel drive to get out of her driveway. She would like to cancel her appointment today. I reminded her of her next appointment scheduled for Friday 05/03/17 at 4:45 PM.  Valentino Saxonachel Quinn-Brown, PT, DPT Physical Therapist with [facility]  05/01/2017 8:59 AM

## 2017-05-02 ENCOUNTER — Other Ambulatory Visit: Payer: Self-pay | Admitting: Obstetrics and Gynecology

## 2017-05-02 ENCOUNTER — Encounter (HOSPITAL_COMMUNITY): Payer: Self-pay

## 2017-05-02 ENCOUNTER — Encounter (HOSPITAL_COMMUNITY)
Admission: RE | Admit: 2017-05-02 | Discharge: 2017-05-02 | Disposition: A | Discharge status: Home / Self Care | Payer: BC Managed Care – PPO | Source: Ambulatory Visit | Attending: Obstetrics and Gynecology | Admitting: Obstetrics and Gynecology

## 2017-05-02 ENCOUNTER — Other Ambulatory Visit: Payer: Self-pay

## 2017-05-02 DIAGNOSIS — Z01812 Encounter for preprocedural laboratory examination: Secondary | ICD-10-CM | POA: Diagnosis not present

## 2017-05-02 DIAGNOSIS — Z01810 Encounter for preprocedural cardiovascular examination: Secondary | ICD-10-CM | POA: Diagnosis present

## 2017-05-02 HISTORY — DX: Other complications of anesthesia, initial encounter: T88.59XA

## 2017-05-02 HISTORY — DX: Adverse effect of unspecified anesthetic, initial encounter: T41.45XA

## 2017-05-02 LAB — URINALYSIS, ROUTINE W REFLEX MICROSCOPIC
Bilirubin Urine: NEGATIVE
GLUCOSE, UA: NEGATIVE mg/dL
HGB URINE DIPSTICK: NEGATIVE
Ketones, ur: NEGATIVE mg/dL
LEUKOCYTES UA: NEGATIVE
Nitrite: NEGATIVE
PROTEIN: NEGATIVE mg/dL
SPECIFIC GRAVITY, URINE: 1.016 (ref 1.005–1.030)
pH: 6 (ref 5.0–8.0)

## 2017-05-02 LAB — CBC
HEMATOCRIT: 43.1 % (ref 36.0–46.0)
HEMOGLOBIN: 14 g/dL (ref 12.0–15.0)
MCH: 31.8 pg (ref 26.0–34.0)
MCHC: 32.5 g/dL (ref 30.0–36.0)
MCV: 98 fL (ref 78.0–100.0)
Platelets: 219 10*3/uL (ref 150–400)
RBC: 4.4 MIL/uL (ref 3.87–5.11)
RDW: 12.7 % (ref 11.5–15.5)
WBC: 6.7 10*3/uL (ref 4.0–10.5)

## 2017-05-02 LAB — BASIC METABOLIC PANEL
ANION GAP: 10 (ref 5–15)
BUN: 17 mg/dL (ref 6–20)
CHLORIDE: 106 mmol/L (ref 101–111)
CO2: 24 mmol/L (ref 22–32)
Calcium: 9.3 mg/dL (ref 8.9–10.3)
Creatinine, Ser: 0.69 mg/dL (ref 0.44–1.00)
GFR calc non Af Amer: >60 mL/min (ref >60)
GLUCOSE: 78 mg/dL (ref 65–99)
POTASSIUM: 3.2 mmol/L — AB (ref 3.5–5.1)
Sodium: 140 mmol/L (ref 135–145)

## 2017-05-02 LAB — HCG, QUANTITATIVE, PREGNANCY

## 2017-05-02 LAB — GC/CHLAMYDIA PROBE AMP
Chlamydia trachomatis, NAA: NEGATIVE
NEISSERIA GONORRHOEAE BY PCR: NEGATIVE

## 2017-05-02 NOTE — Pre-Procedure Instructions (Signed)
Patient has " a lot of allergies and I prefer not to wash with the hibiclens." Instructed patient to take the same night before and morning of showers and use dial soap instead. She verbalizes understanding of this.

## 2017-05-02 NOTE — Progress Notes (Signed)
Progress Notes by Tilda Burrow, MD at 04/30/2017 2:45 PM   Author: Tilda Burrow, MD Author Type: Physician Filed: 05/01/2017 1:21 PM  Note Status: Signed Cosign: Cosign Not Required Encounter Date: 04/30/2017  Editor: Tilda Burrow, MD (Physician)  Expand All Collapse All   Preoperative History and Physical  Deanna Friedman is a 41 y.o. [id number] here for surgical management of desire for sterilization, and dysmenorrhea.   No significant preoperative concerns. Pro's and Con's for salpingectomy vs tubal ligation by clips or ligation discussed, pt chooses salpingectomy with it's slight reduction in lifetime cancer risk of ovary/tube. Menses are described as currently absent due to depo provera, but pt wishes to d/c Depo as it  Results in weight gain. Pt lost 20 pounds in 1+ month when  Proposed surgery: 1.  Laparoscopic bilateral salpingectomy                                  2.  Hysteroscopy D&C endometrial ablation      Past Medical History:  Diagnosis Date  . ADHD (attention deficit hyperactivity disorder)   . Allergy    food, meds, mold, dust  . Anemia   . Asthma   . Blood transfusion without reported diagnosis    childbirth  . Body aches 11/29/2014  . BV (bacterial vaginosis) 10/14/2013  . Contraceptive management 03/31/2014  . Decreased sex drive 4/54/0981  . Folliculitis 01/27/2015  . Heart murmur   . HTN (hypertension)   . Hypercholesteremia 10/09/2016  . Migraines   . Multiple allergies   . Obesity 10/14/2013  . Vaginal itching 12/30/2014        Past Surgical History:  Procedure Laterality Date  . ANTERIOR CRUCIATE LIGAMENT REPAIR Left 01/29/2017  . BREAST LUMPECTOMY Right    fibroadenoma  . DILATION AND CURETTAGE OF UTERUS    . KNEE ARTHROSCOPY                     OB History  Gravida Para Term Preterm AB Living  3 3 3     3   SAB TAB Ectopic Multiple Live Births          3    # Outcome Date GA Lbr Len/2nd Weight Sex Delivery Anes  PTL Lv  3 Term     M Vag-Spont   LIV  2 Term     F Vag-Spont   LIV  1 Term     F Vag-Spont   LIV    Patient denies any other pertinent gynecologic issues.         Current Outpatient Medications on File Prior to Visit  Medication Sig Dispense Refill  . albuterol (PROVENTIL HFA;VENTOLIN HFA) 108 (90 BASE) MCG/ACT inhaler Inhale 2 puffs into the lungs every 6 (six) hours as needed for wheezing or shortness of breath. 1 Inhaler 0  . beclomethasone (QVAR) 40 MCG/ACT inhaler Inhale 1 puff into the lungs 3 times/day as needed-between meals & bedtime.    Marland Kitchen Bioflavonoid Products (VITAMIN C) CHEW Chew by mouth daily.    . Black Cohosh 540 MG CAPS Take by mouth daily.     . Cholecalciferol (VITAMIN D PO) Take by mouth daily.    . Cyanocobalamin (VITAMIN B12 PO) Take by mouth daily.    . diphenhydrAMINE (BENADRYL) 25 MG tablet Take 25 mg by mouth every 6 (six) hours as needed.    Marland Kitchen EPINEPHrine (EPIPEN)  0.3 mg/0.3 mL IJ SOAJ injection Inject 0.3 mLs (0.3 mg total) into the muscle once. 1 Device 0  . hydrochlorothiazide (MICROZIDE) 12.5 MG capsule TAKE 1 CAPSULE(12.5 MG) BY MOUTH DAILY 30 capsule 6  . ibuprofen (ADVIL,MOTRIN) 800 MG tablet Take 800 mg by mouth as needed.     . Loratadine 10 MG CAPS Take by mouth daily.     Marland Kitchen. losartan (COZAAR) 50 MG tablet TAKE 1 TABLET(50 MG) BY MOUTH DAILY 30 tablet 3  . medroxyPROGESTERone (DEPO-PROVERA) 150 MG/ML injection Inject 1 mL (150 mg total) into the muscle every 3 (three) months. 1 mL 4  . Melatonin 3 MG CAPS Take by mouth daily.     . meperidine (DEMEROL) 50 MG tablet 50 mg as needed.   0  . mometasone (ELOCON) 0.1 % cream Apply 1 application topically daily. (Patient taking differently: Apply 1 application topically as needed. ) 45 g 1  . nystatin-triamcinolone (MYCOLOG II) cream Apply 1 application topically 2 (two) times daily. (Patient taking differently: Apply 1 application topically as needed. ) 30 g 3  .  traMADol (ULTRAM) 50 MG tablet Take 50 mg by mouth every 6 (six) hours as needed for pain.              Current Facility-Administered Medications on File Prior to Visit  Medication Dose Route Frequency Provider Last Rate Last Dose  . triamcinolone acetonide (KENALOG) 10 MG/ML injection 10 mg  10 mg Other Once Asencion IslamStover, Titorya, DPM            Allergies  Allergen Reactions  . Nutritional Supplements Anaphylaxis    Pitted fruit, highly reactive to cherries  . Shellfish Allergy Hives  . Other Hives    Pitted Fruits  . Aloe Hives  . Cephalexin Other (See Comments)    Severe headache  . Coconut Fatty Acids Hives    Coconut eaten  . Dust Mite Extract Hives  . Food Hives    Tree nuts  . Percodan [Oxycodone-Aspirin] Other (See Comments)    Serious headache    Social History:   reports that she has quit smoking. Her smoking use included cigarettes. she has never used smokeless tobacco. She reports that she drinks alcohol. She reports that she does not use drugs.       Family History  Problem Relation Age of Onset  . Hypertension Mother   . Heart disease Mother   . Alcohol abuse Mother   . Arthritis Mother   . Asthma Mother   . Depression Mother   . Alcohol abuse Maternal Grandfather   . Hypertension Paternal Grandmother   . Diabetes Paternal Grandmother   . Cancer Paternal Grandfather        lung  . Graves' disease Sister   . Other Maternal Grandmother        was murdered  . Early death Maternal Grandmother   . Alcohol abuse Father   . Arthritis Father     Review of Systems: Noncontributory  PHYSICAL EXAM: Blood pressure 122/82, pulse 92, height 5\' 4"  (1.626 m), weight 198 lb (89.8 kg). General appearance - alert, well appearing, and in no distress Chest - clear to auscultation, no wheezes, rales or rhonchi, symmetric air entry Heart - normal rate and regular rhythm Abdomen - soft, nontender, nondistended, no masses or  organomegaly                     Abd umbilicus has a 2 cm umbilical hernia that is  tender , but reducible. Pelvic - examination EFG Normal, vagina:parous introitus, moderate rectocele, not symptomatic, uterus nomral sized adnexa neogatice Extremities - peripheral pulses normal, no pedal edema, no clubbing or cyanosis  Labs: No results found for this or any previous visit (from the past 336 hour(s)).  Imaging Studies: ImagingResults  No results found.    Assessment:     Patient Active Problem List   Diagnosis Date Noted  . Hypercholesteremia 10/09/2016  . Immunization refused 09/11/2016  . Intermittent asthma without complication 09/11/2016  . Well woman exam with routine gynecological exam 01/13/2016  . Contraceptive management 03/31/2014  . Class 2 obesity due to excess calories without serious comorbidity with body mass index (BMI) of 36.0 to 36.9 in adult 10/14/2013  . Migraines   . HTN (hypertension)   . Multiple allergies   DESIRE FOR PERMANENT STERILIZATION, DYSMENORRHEA PELVIC RELAXATION  ASSYMPTOMATIC UMBILICAL HERN9A./  Plan: Patient will undergo surgical management with HYSTEROSCOPY, dILATON AND CURETTAGE, LAPAROSCOPIC BILATERAL SALPINGECTOMY, UMBILICAL HERNIORAPHY.  TO THE OR FOR ;;LAPAROSCOPY .    Marland Kitchen.mec 04/30/2017 2:55 PM

## 2017-05-03 ENCOUNTER — Encounter (HOSPITAL_COMMUNITY): Payer: Self-pay

## 2017-05-03 ENCOUNTER — Other Ambulatory Visit: Payer: Self-pay

## 2017-05-03 ENCOUNTER — Ambulatory Visit (HOSPITAL_COMMUNITY): Payer: BC Managed Care – PPO

## 2017-05-03 DIAGNOSIS — R262 Difficulty in walking, not elsewhere classified: Secondary | ICD-10-CM

## 2017-05-03 DIAGNOSIS — M25562 Pain in left knee: Secondary | ICD-10-CM | POA: Diagnosis not present

## 2017-05-03 DIAGNOSIS — M6281 Muscle weakness (generalized): Secondary | ICD-10-CM

## 2017-05-03 DIAGNOSIS — M25662 Stiffness of left knee, not elsewhere classified: Secondary | ICD-10-CM

## 2017-05-03 DIAGNOSIS — R600 Localized edema: Secondary | ICD-10-CM

## 2017-05-03 NOTE — Therapy (Signed)
[place], [place], [address] Phone: [phone]   Fax:  [phone]  Physical Therapy Treatment/Reassessment  Patient Details  Name: Deanna Friedman MRN: [id number] Date of Birth: 11-19-1975 Referring Provider: Frederich Balding, MD   Encounter Date: 05/03/2017  PT End of Session - 05/03/17 0818    Visit Number  28    Number of Visits  37    Date for PT Re-Evaluation  04/05/17    Authorization Type  [place] Time Period  05/03/17 to 05/20/17    PT Start Time  0816    PT Stop Time  0857    PT Time Calculation (min)  41 min    Equipment Utilized During Treatment  Left knee immobilizer    Activity Tolerance  Patient tolerated treatment well    Behavior During Therapy  [facility] for tasks assessed/performed       Past Medical History:  Diagnosis Date  . ADHD (attention deficit hyperactivity disorder)   . Allergy    food, meds, mold, dust  . Anemia   . Asthma   . Blood transfusion without reported diagnosis    childbirth  . Body aches 11/29/2014  . BV (bacterial vaginosis) 10/14/2013  . Complication of anesthesia    'I wake up combative".  . Contraceptive management 03/31/2014  . Decreased sex drive 6/55/3748  . Folliculitis 06/27/784  . Heart murmur   . HTN (hypertension)   . Hypercholesteremia 10/09/2016  . Migraines   . Multiple allergies   . Obesity 10/14/2013  . Vaginal itching 12/30/2014    Past Surgical History:  Procedure Laterality Date  . ANTERIOR CRUCIATE LIGAMENT REPAIR Left 01/29/2017  . BREAST LUMPECTOMY Right    fibroadenoma  . DILATION AND CURETTAGE OF UTERUS  2011  . KNEE ARTHROSCOPY Bilateral     There were no vitals filed for this visit.  Subjective Assessment - 05/03/17 0818    Subjective  Pt reports that both of her knees have been killing her. She said that some stretches helped. It's just been really stiff when she goes to squat and stretch. She said that  the instability is still occurring. She said it happened last night when she went to sit and her knee went out to the side/gives but then it corrected itself.     Currently in Pain?  No/denies         [facility] PT Assessment - 05/03/17 0001      Functional Tests   Functional tests  Squat;Single Leg Squat;Sit to Stand      Squat   Comments  3/5 with great form, but increased pain and weakness      Single Leg Squat   Comments  3/5 completed, increased knee valgus and sharp pain at anterior knee Single leg      Sit to Stand   Comments  1/5, LOB onto table on 2nd rep, sharp pain at anterior knee single leg      AROM   Left Knee Extension  0    Left Knee Flexion  127      Strength   Left Hip Extension  4+/5 was 4+    Left Hip ABduction  4+/5 was 4+    Left Knee Flexion  4+/5 was 4-    Left Knee Extension  4+/5 was 4+      Special Tests    Special Tests  Knee Special Tests;Laxity/Instability Tests  Laxity/Instability   Anterior drawer test    Knee Special tests   other;other2      Anterior drawer test   Findings  Negative    Side  Left      other    Findings  Negative    Side   Left    Comments  varus stress      other   findings  Negative    Side  Left    Comments  pivot shift           [facility] Adult PT Treatment/Exercise - 05/03/17 0001      Knee/Hip Exercises: Standing   Other Standing Knee Exercises  single leg RDLs with 5# weight x20 reps    Other Standing Knee Exercises  lunge on 8" box + hip ER with BTB x20 reps      Knee/Hip Exercises: Seated   Long Arc Quad  Strengthening;Left;20 reps;Weights    Long Arc Quad Weight  3 lbs.      Knee/Hip Exercises: Supine   Other Supine Knee/Hip Exercises  eccentric HS sliders x5 reps          PT Education - 05/03/17 0857    Education provided  Yes    Education Details  will extend POC date to cover remaining approved visits until 12/31; updated HEP, no laxity or instabilty noted with ligament special tests     Person(s) Educated  Patient    Methods  Explanation;Demonstration;Handout    Comprehension  Verbalized understanding;Returned demonstration       PT Short Term Goals - 05/03/17 8916      PT SHORT TERM GOAL #1   Title  Pt will be independent with HEP and perform consistently in order to maximize return to PLOF.    Time  3    Period  Weeks    Status  Achieved      PT SHORT TERM GOAL #2   Title  Pt will have improved L knee AROM to 5-100 deg to decrease pain and maximize overall function.    Baseline  10/26: 0-112    Time  3    Period  Weeks    Status  Achieved      PT SHORT TERM GOAL #3   Title  Pt will have improved MMT of hip musculature to 5/5 to maximize gait.     Time  3    Period  Weeks    Status  Achieved      PT SHORT TERM GOAL #4   Title  Pt will have decreased swelling to at least 15" on the L at joint line to decrease pain and maximize ROM.    Baseline  10/26" 14.5" joint line    Time  3    Period  Weeks    Status  Achieved        PT Long Term Goals - 05/03/17 9450      PT LONG TERM GOAL #1   Title  Pt will have improved L knee AROM to at least 0-120 deg to demo improved overall function and maximize sitting tolerance, gait, and stair ambulation    Baseline  12/14: 0-127    Time  6    Period  Weeks    Status  Achieved      PT LONG TERM GOAL #2   Title  Pt will have 5/5 L knee MMT to maximize gait and return to PLOF.    Baseline  12/14: 4+/5 L knee  and hip    Time  6    Period  Weeks    Status  Partially Met      PT LONG TERM GOAL #3   Title  Pt will be able to perform SLS for at least 10 sec on LLE to demo improved function and maximize gait.     Baseline  10/26: 15 sec     Time  6    Period  Weeks    Status  Achieved      PT LONG TERM GOAL #4   Title  Pt will be able to perform squatting with no knee pain and proper mechanics, and be able to lift at leat 20# from floor to chest in order to maximize ability to perform work and house/yard duties.      Baseline  12/14: great form and able to lift 20# floor to chest, but had increased anterior knee pain; great form with squatting down to 18" box but had anterior knee pain    Time  6    Period  Weeks    Status  Partially Met      PT LONG TERM GOAL #5   Title  Pt will report being able to return to her regular gym/exercise routine with no knee pain in order to maximize return to PLOF.     Baseline  12/14: has not returned to the gym yet    Time  6    Period  Weeks    Status  On-going      PT LONG TERM GOAL #6   Title  Pt will be able to ambulate a flight of stairs with no handrail, good eccentric control, and no knee pain to demonstrate improved functional knee strength and maximize work and community access.     Time  6    Period  Weeks    Status  On-going      PT LONG TERM GOAL #7   Title  Pt will be able to perform L SL sit <> stand and L SL squat at least 3/5 times with no UE and no evidence of knee valgus to demo improved functional strength of LLE and decrese risk for reinjury.    Baseline  12/14: L SL sit <> stand: increased knee valgus and anterior knee pain but able to do 5 reps;  L SL squat: only able to perform 1/5 reps and had increased valgus and anterior knee pain    Time  6    Period  Weeks    Status  On-going            Plan - 05/03/17 0917    Clinical Impression Statement  PT reassessed pt's goals and outcome measures this date. Pt continues to c/o intermittent bouts of instability when going to sit or squat and continued anterior knee pain. PT performed ligament special tests (anterior drawer, pivot shift, varus stress test) and all were negative as she had no pain and evidence of unsteadiness. Pt's strength is 4+/5 throughout hip and knee. She is able to complete squats and single leg sit to stands but has anterior knee pain, decreased control, and increased valgus with the single leg activities. PT educated pt that the anterior knee pain and instability is  likely due to continued muscular weakness, especially quads, as her ligament tests were sound and her MD was pleased with her last check-up. PT inquired about her prior gym routine and pt stated that she typically cycles and will  try to get into some group fitness classes every now and then. Rest of session focused on quad, hip, and HS strengthening. Provided pt with updated HEP. PT extending pt's POC until 05/20/17 to cover remaining visits approved (pt does not want to continue after 05/20/17) in order to continue strengthening and provide pt with extensive HEP in order for her to continue to promote return to PLOF once discharged. Next session begin TM walking and light jogging as pt's protocol allowed for this at post-op week 12 and pt will be 14 weeks next week. Also continue to focus on single-leg activities such as single leg sit to stands, squats, RDLs, proprioceptive work, Social research officer, government. May also initiate lateral agility drills and plyometrics as her protocol allows this starting at week 14.    Rehab Potential  Good    PT Frequency  3x / week    PT Duration  3 weeks    PT Treatment/Interventions  ADLs/Self Care Home Management;Cryotherapy;Electrical Stimulation;Moist Heat;Ultrasound;DME Instruction;Gait training;Stair training;Functional mobility training;Therapeutic activities;Therapeutic exercise;Balance training;Neuromuscular re-education;Patient/family education;Manual techniques;Wheelchair mobility training;Passive range of motion;Scar mobilization;Dry needling;Energy conservation;Taping    PT Next Visit Plan  Follow [facility] Orthopedic ACL Allograft Reconstruction Protocol. begin walk/jog on treatmill; single leg activities (sit <> stands, squats, RDLs, proprioceptive work, etc), eccentric strengthening (HS, quads), may initiate lateral agility and plyometric drills    PT Home Exercise Plan  11/21: told pt to discontinue initial HEP but to continue TKE, SAQ and add heel taps on step, sidestep with  constant tension, wall squats and wall sits for strengthening; 12/14: LAQ, clams with BTB, lunge on 8" step + hip ER with BTB, eccentric HS sliders    Consulted and Agree with Plan of Care  Patient       Patient will benefit from skilled therapeutic intervention in order to improve the following deficits and impairments:  Abnormal gait, Decreased activity tolerance, Decreased balance, Decreased endurance, Decreased mobility, Decreased range of motion, Decreased strength, Difficulty walking, Hypomobility, Increased edema, Increased muscle spasms, Increased fascial restricitons, Impaired flexibility, Pain  Visit Diagnosis: Acute pain of left knee - Plan: PT plan of care cert/re-cert  Stiffness of left knee - Plan: PT plan of care cert/re-cert  Muscle weakness (generalized) - Plan: PT plan of care cert/re-cert  Localized edema - Plan: PT plan of care cert/re-cert  Difficulty in walking, not elsewhere classified - Plan: PT plan of care cert/re-cert     Problem List Patient Active Problem List   Diagnosis Date Noted  . Hypercholesteremia 10/09/2016  . Immunization refused 09/11/2016  . Intermittent asthma without complication 40/98/1191  . Well woman exam with routine gynecological exam 01/13/2016  . Contraceptive management 03/31/2014  . Class 2 obesity due to excess calories without serious comorbidity with body mass index (BMI) of 36.0 to 36.9 in adult 10/14/2013  . Migraines   . HTN (hypertension)   . Multiple allergies       Geraldine Solar PT, DPT  [place] [address] [place], [place], [address] Phone: [phone]   Fax:  [phone]  Name: AVERYANNA SAX MRN: [id number] Date of Birth: 10-09-1975

## 2017-05-03 NOTE — Patient Instructions (Addendum)
  LONG ARC QUAD - LAQ - HIGH SEAT  While seated with your knee in a bent position, slowly straighten your knee as you raise your foot upwards as shown.    ELASTIC BAND - SIDELYING CLAM - [place]   While lying on your side with your knees bent and an elastic band wrapped around your knees, draw up the top knee while keeping contact of your feet together as shown.   Do not let your pelvis roll back during the lifting movement.    Blue band   Eccentric Hamstring Curls with Sliders  Starting in bridge position with sliders under heels, lift hips up in the air. Next slowly lower body towards ground by sliding heels away from your body. Keeping glutes on the ground, bring heels back to starting position, bridge up lifting hips, then slowly lower, repeat. Only focusing on eccentric (negative) component.  Perform on vinyl floor with rags under heels

## 2017-05-06 ENCOUNTER — Other Ambulatory Visit: Payer: Self-pay

## 2017-05-06 ENCOUNTER — Encounter (HOSPITAL_COMMUNITY): Payer: Self-pay

## 2017-05-06 ENCOUNTER — Ambulatory Visit (HOSPITAL_COMMUNITY): Payer: BC Managed Care – PPO

## 2017-05-06 DIAGNOSIS — R262 Difficulty in walking, not elsewhere classified: Secondary | ICD-10-CM

## 2017-05-06 DIAGNOSIS — M25562 Pain in left knee: Secondary | ICD-10-CM

## 2017-05-06 DIAGNOSIS — R600 Localized edema: Secondary | ICD-10-CM

## 2017-05-06 DIAGNOSIS — M6281 Muscle weakness (generalized): Secondary | ICD-10-CM

## 2017-05-06 DIAGNOSIS — M25662 Stiffness of left knee, not elsewhere classified: Secondary | ICD-10-CM

## 2017-05-06 NOTE — H&P (Signed)
Preoperative History and Physical  Deanna Friedman is a 41 y.o. [phone] here for surgical management of desire for sterilization, and dysmenorrhea.   No significant preoperative concerns. Pro's and Con's for salpingectomy vs tubal ligation by clips or ligation discussed, pt chooses salpingectomy with it's slight reduction in lifetime cancer risk of ovary/tube. Menses are described as currently absent due to depo provera, but pt wishes to d/c Depo as it  Results in weight gain. Pt lost 20 pounds in 1+ month when  Proposed surgery: 1.  Laparoscopic bilateral salpingectomy                                  2.  Hysteroscopy D&C endometrial ablation      Past Medical History:  Diagnosis Date  . ADHD (attention deficit hyperactivity disorder)   . Allergy    food, meds, mold, dust  . Anemia   . Asthma   . Blood transfusion without reported diagnosis    childbirth  . Body aches 11/29/2014  . BV (bacterial vaginosis) 10/14/2013  . Contraceptive management 03/31/2014  . Decreased sex drive 4/54/0981  . Folliculitis 01/27/2015  . Heart murmur   . HTN (hypertension)   . Hypercholesteremia 10/09/2016  . Migraines   . Multiple allergies   . Obesity 10/14/2013  . Vaginal itching 12/30/2014        Past Surgical History:  Procedure Laterality Date  . ANTERIOR CRUCIATE LIGAMENT REPAIR Left 01/29/2017  . BREAST LUMPECTOMY Right    fibroadenoma  . DILATION AND CURETTAGE OF UTERUS    . KNEE ARTHROSCOPY                     OB History  Gravida Para Term Preterm AB Living  3 3 3     3   SAB TAB Ectopic Multiple Live Births          3    # Outcome Date GA Lbr Len/2nd Weight Sex Delivery Anes PTL Lv  3 Term     M Vag-Spont   LIV  2 Term     F Vag-Spont   LIV  1 Term     F Vag-Spont   LIV    Patient denies any other pertinent gynecologic issues.         Current Outpatient Medications on File Prior to Visit  Medication Sig Dispense Refill  . albuterol  (PROVENTIL HFA;VENTOLIN HFA) 108 (90 BASE) MCG/ACT inhaler Inhale 2 puffs into the lungs every 6 (six) hours as needed for wheezing or shortness of breath. 1 Inhaler 0  . beclomethasone (QVAR) 40 MCG/ACT inhaler Inhale 1 puff into the lungs 3 times/day as needed-between meals & bedtime.    Marland Kitchen Bioflavonoid Products (VITAMIN C) CHEW Chew by mouth daily.    . Black Cohosh 540 MG CAPS Take by mouth daily.     . Cholecalciferol (VITAMIN D PO) Take by mouth daily.    . Cyanocobalamin (VITAMIN B12 PO) Take by mouth daily.    . diphenhydrAMINE (BENADRYL) 25 MG tablet Take 25 mg by mouth every 6 (six) hours as needed.    Marland Kitchen EPINEPHrine (EPIPEN) 0.3 mg/0.3 mL IJ SOAJ injection Inject 0.3 mLs (0.3 mg total) into the muscle once. 1 Device 0  . hydrochlorothiazide (MICROZIDE) 12.5 MG capsule TAKE 1 CAPSULE(12.5 MG) BY MOUTH DAILY 30 capsule 6  . ibuprofen (ADVIL,MOTRIN) 800 MG tablet Take 800 mg by mouth as needed.     Marland Kitchen  Loratadine 10 MG CAPS Take by mouth daily.     Marland Kitchen. losartan (COZAAR) 50 MG tablet TAKE 1 TABLET(50 MG) BY MOUTH DAILY 30 tablet 3  . medroxyPROGESTERone (DEPO-PROVERA) 150 MG/ML injection Inject 1 mL (150 mg total) into the muscle every 3 (three) months. 1 mL 4  . Melatonin 3 MG CAPS Take by mouth daily.     . meperidine (DEMEROL) 50 MG tablet 50 mg as needed.   0  . mometasone (ELOCON) 0.1 % cream Apply 1 application topically daily. (Patient taking differently: Apply 1 application topically as needed. ) 45 g 1  . nystatin-triamcinolone (MYCOLOG II) cream Apply 1 application topically 2 (two) times daily. (Patient taking differently: Apply 1 application topically as needed. ) 30 g 3  . traMADol (ULTRAM) 50 MG tablet Take 50 mg by mouth every 6 (six) hours as needed for pain.              Current Facility-Administered Medications on File Prior to Visit  Medication Dose Route Frequency Provider Last Rate Last Dose  . triamcinolone acetonide (KENALOG) 10 MG/ML injection 10  mg  10 mg Other Once Asencion IslamStover, Titorya, DPM            Allergies  Allergen Reactions  . Nutritional Supplements Anaphylaxis    Pitted fruit, highly reactive to cherries  . Shellfish Allergy Hives  . Other Hives    Pitted Fruits  . Aloe Hives  . Cephalexin Other (See Comments)    Severe headache  . Coconut Fatty Acids Hives    Coconut eaten  . Dust Mite Extract Hives  . Food Hives    Tree nuts  . Percodan [Oxycodone-Aspirin] Other (See Comments)    Serious headache    Social History:   reports that she has quit smoking. Her smoking use included cigarettes. she has never used smokeless tobacco. She reports that she drinks alcohol. She reports that she does not use drugs.       Family History  Problem Relation Age of Onset  . Hypertension Mother   . Heart disease Mother   . Alcohol abuse Mother   . Arthritis Mother   . Asthma Mother   . Depression Mother   . Alcohol abuse Maternal Grandfather   . Hypertension Paternal Grandmother   . Diabetes Paternal Grandmother   . Cancer Paternal Grandfather        lung  . Graves' disease Sister   . Other Maternal Grandmother        was murdered  . Early death Maternal Grandmother   . Alcohol abuse Father   . Arthritis Father     Review of Systems: Noncontributory  PHYSICAL EXAM: Blood pressure 122/82, pulse 92, height 5\' 4"  (1.626 m), weight 198 lb (89.8 kg). General appearance - alert, well appearing, and in no distress Chest - clear to auscultation, no wheezes, rales or rhonchi, symmetric air entry Heart - normal rate and regular rhythm Abdomen - soft, nontender, nondistended, no masses or organomegaly                     Abd umbilicus has a 2 cm umbilical hernia that is tender , but reducible. Pelvic - examination EFG Normal, vagina:parous introitus, moderate rectocele, not symptomatic, uterus nomral sized adnexa neogatice Extremities - peripheral pulses normal, no pedal edema, no clubbing  or cyanosis  Labs: CBC    Component Value Date/Time   WBC 6.7 05/02/2017 1419   RBC 4.40 05/02/2017 1419  HGB 14.0 05/02/2017 1419   HGB 14.2 01/13/2016 1121   HCT 43.1 05/02/2017 1419   HCT 42.2 01/13/2016 1121   PLT 219 05/02/2017 1419   PLT 229 01/13/2016 1121   MCV 98.0 05/02/2017 1419   MCV 95 01/13/2016 1121   MCH 31.8 05/02/2017 1419   MCHC 32.5 05/02/2017 1419   RDW 12.7 05/02/2017 1419   RDW 13.2 01/13/2016 1121   LYMPHSABS 2.3 08/19/2015 1401   MONOABS 0.6 08/19/2015 1401   EOSABS 0.0 08/19/2015 1401   BASOSABS 0.0 08/19/2015 1401   BMET    Component Value Date/Time   NA 140 05/02/2017 1419   NA 141 01/13/2016 1121   K 3.2 (L) 05/02/2017 1419   CL 106 05/02/2017 1419   CO2 24 05/02/2017 1419   GLUCOSE 78 05/02/2017 1419   BUN 17 05/02/2017 1419   BUN 15 01/13/2016 1121   CREATININE 0.69 05/02/2017 1419   CREATININE 0.83 11/06/2016 1137   CALCIUM 9.3 05/02/2017 1419   GFRNONAA >60 05/02/2017 1419   GFRAA >60 05/02/2017 1419     Assessment:     Patient Active Problem List   Diagnosis Date Noted  . Hypercholesteremia 10/09/2016  . Immunization refused 09/11/2016  . Intermittent asthma without complication 09/11/2016  . Well woman exam with routine gynecological exam 01/13/2016  . Contraceptive management 03/31/2014  . Class 2 obesity due to excess calories without serious comorbidity with body mass index (BMI) of 36.0 to 36.9 in adult 10/14/2013  . Migraines   . HTN (hypertension)   . Multiple allergies   DESIRE FOR PERMANENT STERILIZATION, DYSMENORRHEA PELVIC RELAXATION  ASSYMPTOMATIC UMBILICAL HERN9A./  Plan: Patient will undergo surgical management with HYSTEROSCOPY, dILATON AND CURETTAGE, LAPAROSCOPIC BILATERAL SALPINGECTOMY, UMBILICAL HERNIORAPHY.  TO THE OR FOR  The above procedures on 05/07/17   Tilda BurrowJohn V Reason Helzer, MD  04/30/2017 2:55 PM

## 2017-05-06 NOTE — Therapy (Signed)
[facility] [address] [place], [place], [address] Phone: [phone]   Fax:  [phone]  Physical Therapy Treatment  Patient Details  Name: Deanna Friedman MRN: [id number] Date of Birth: 05/02/1976 Referring Provider: Robert A Collins, MD   Encounter Date: 05/06/2017  PT End of Session - 05/06/17 1735    Visit Number  29    Number of Visits  37    Date for PT Re-Evaluation  05/20/17    Authorization Type  [employer]    Authorization Time Period  05/03/17 to 05/20/17    PT Start Time  1648    PT Stop Time  1730    PT Time Calculation (min)  42 min    Equipment Utilized During Treatment  --    Activity Tolerance  Patient tolerated treatment well    Behavior During Therapy  WFL for tasks assessed/performed       Past Medical History:  Diagnosis Date  . ADHD (attention deficit hyperactivity disorder)   . Allergy    food, meds, mold, dust  . Anemia   . Asthma   . Blood transfusion without reported diagnosis    childbirth  . Body aches 11/29/2014  . BV (bacterial vaginosis) 10/14/2013  . Complication of anesthesia    'I wake up combative".  . Contraceptive management 03/31/2014  . Decreased sex drive 12/30/2014  . Folliculitis 01/27/2015  . Heart murmur   . HTN (hypertension)   . Hypercholesteremia 10/09/2016  . Migraines   . Multiple allergies   . Obesity 10/14/2013  . Vaginal itching 12/30/2014    Past Surgical History:  Procedure Laterality Date  . ANTERIOR CRUCIATE LIGAMENT REPAIR Left 01/29/2017  . BREAST LUMPECTOMY Right    fibroadenoma  . DILATION AND CURETTAGE OF UTERUS  2011  . KNEE ARTHROSCOPY Bilateral     There were no vitals filed for this visit.  Subjective Assessment - 05/06/17 1651    Subjective  Pt reports she is feeling oalright overall, she has abuot a 1/10 on left knee. She states her right knee bothers her more than her left lately. She has some pain with her HEp in left anterior knee,  particulary with squats. She has an elective OP surgery tomorrow for and does not think it will impact PT.  She still wants to be done with therapy at the end of December because of her deductable.    Currently in Pain?  Yes    Pain Score  1     Pain Location  Knee    Pain Orientation  Left    Pain Descriptors / Indicators  Aching    Pain Type  Surgical pain    Pain Onset  More than a month ago    Pain Frequency  Intermittent        [facility] Adult PT Treatment/Exercise - 05/06/17 0001      Knee/Hip Exercises: Aerobic   Tread Mill  5 mins walking at 3.0 mph, level      Knee/Hip Exercises: Standing   Forward Lunges  Both;15 reps;1 set    Forward Lunges Limitations  on BOSU, soft side up    Side Lunges  Both;1 set;20 reps    Side Lunges Limitations  with towel under contralateral foot for eccetnric control    Step Down  1 set;20 reps;Hand Hold: 2;Step Height: 4";Left    Functional Squat  1 set;20 reps;Limitations    Functional Squat Limitations  chair for tactile cue      Other Standing Knee Exercises  BLE; single leg RDLs with 5# weight 2x 10 reps    Other Standing Knee Exercises  reverse eccentric control with treadmill 1.0 mph; 2x 1 minute BLE      Knee/Hip Exercises: Supine   Other Supine Knee/Hip Exercises  eccentric hamstring bridge walkout 2 x 10 reps       PT Education - 05/06/17 1732    Education provided  Yes    Education Details  Edcuated on proper for squat and eccentric control exercises.    Person(s) Educated  Patient    Methods  Explanation    Comprehension  Verbalized understanding;Returned demonstration       PT Short Term Goals - 05/03/17 0823      PT SHORT TERM GOAL #1   Title  Pt will be independent with HEP and perform consistently in order to maximize return to PLOF.    Time  3    Period  Weeks    Status  Achieved      PT SHORT TERM GOAL #2   Title  Pt will have improved L knee AROM to 5-100 deg to decrease pain and maximize overall function.     Baseline  10/26: 0-112    Time  3    Period  Weeks    Status  Achieved      PT SHORT TERM GOAL #3   Title  Pt will have improved MMT of hip musculature to 5/5 to maximize gait.     Time  3    Period  Weeks    Status  Achieved      PT SHORT TERM GOAL #4   Title  Pt will have decreased swelling to at least 15" on the L at joint line to decrease pain and maximize ROM.    Baseline  10/26" 14.5" joint line    Time  3    Period  Weeks    Status  Achieved        PT Long Term Goals - 05/03/17 9179      PT LONG TERM GOAL #1   Title  Pt will have improved L knee AROM to at least 0-120 deg to demo improved overall function and maximize sitting tolerance, gait, and stair ambulation    Baseline  12/14: 0-127    Time  6    Period  Weeks    Status  Achieved      PT LONG TERM GOAL #2   Title  Pt will have 5/5 L knee MMT to maximize gait and return to PLOF.    Baseline  12/14: 4+/5 L knee and hip    Time  6    Period  Weeks    Status  Partially Met      PT LONG TERM GOAL #3   Title  Pt will be able to perform SLS for at least 10 sec on LLE to demo improved function and maximize gait.     Baseline  10/26: 15 sec     Time  6    Period  Weeks    Status  Achieved      PT LONG TERM GOAL #4   Title  Pt will be able to perform squatting with no knee pain and proper mechanics, and be able to lift at leat 20# from floor to chest in order to maximize ability to perform work and house/yard duties.     Baseline  12/14: great form and able to lift  20# floor to chest, but had increased anterior knee pain; great form with squatting down to 18" box but had anterior knee pain    Time  6    Period  Weeks    Status  Partially Met      PT LONG TERM GOAL #5   Title  Pt will report being able to return to her regular gym/exercise routine with no knee pain in order to maximize return to PLOF.     Baseline  12/14: has not returned to the gym yet    Time  6    Period  Weeks    Status  On-going       PT LONG TERM GOAL #6   Title  Pt will be able to ambulate a flight of stairs with no handrail, good eccentric control, and no knee pain to demonstrate improved functional knee strength and maximize work and community access.     Time  6    Period  Weeks    Status  On-going      PT LONG TERM GOAL #7   Title  Pt will be able to perform L SL sit <> stand and L SL squat at least 3/5 times with no UE and no evidence of knee valgus to demo improved functional strength of LLE and decrese risk for reinjury.    Baseline  12/14: L SL sit <> stand: increased knee valgus and anterior knee pain but able to do 5 reps;  L SL squat: only able to perform 1/5 reps and had increased valgus and anterior knee pain    Time  6    Period  Weeks    Status  On-going        Plan - 05/06/17 1736    Clinical Impression Statement  Patient continues to make excellent progress in therapy. She progressed functional strengthening today with a focus on eccentric control for her left quadriceps and hamstring. She continues to be limited by anterior knee pain with quad strengthening and knee valgus during left single limb stance strengthening. She will continue to benefit from skilled PT services to progress towards remaining goals and return to prior level of functioning.    Rehab Potential  Good    PT Frequency  3x / week    PT Duration  3 weeks    PT Treatment/Interventions  ADLs/Self Care Home Management;Cryotherapy;Electrical Stimulation;Moist Heat;Ultrasound;DME Instruction;Gait training;Stair training;Functional mobility training;Therapeutic activities;Therapeutic exercise;Balance training;Neuromuscular re-education;Patient/family education;Manual techniques;Wheelchair mobility training;Passive range of motion;Scar mobilization;Dry needling;Energy conservation;Taping    PT Next Visit Plan  Follow [facility] ACL Allograft Reconstruction Protocol. Continue walk/jog on treatmill; single leg activities with focus on  eccentric control (sit <> stands, squats, RDLs, proprioceptive work, etc), eccentric strengthening (HS, quads), may initiate lateral agility and plyometric drills    PT Home Exercise Plan  11/21: told pt to discontinue initial HEP but to continue TKE, SAQ and add heel taps on step, sidestep with constant tension, wall squats and wall sits for strengthening; 12/14: LAQ, clams with BTB, lunge on 8" step + hip ER with BTB, eccentric HS sliders    Consulted and Agree with Plan of Care  Patient       Patient will benefit from skilled therapeutic intervention in order to improve the following deficits and impairments:  Abnormal gait, Decreased activity tolerance, Decreased balance, Decreased endurance, Decreased mobility, Decreased range of motion, Decreased strength, Difficulty walking, Hypomobility, Increased edema, Increased muscle spasms, Increased fascial restricitons, Impaired flexibility, Pain  Visit   Diagnosis: Acute pain of left knee  Stiffness of left knee  Muscle weakness (generalized)  Localized edema  Difficulty in walking, not elsewhere classified     Problem List Patient Active Problem List   Diagnosis Date Noted  . Hypercholesteremia 10/09/2016  . Immunization refused 09/11/2016  . Intermittent asthma without complication 09/11/2016  . Well woman exam with routine gynecological exam 01/13/2016  . Contraceptive management 03/31/2014  . Class 2 obesity due to excess calories without serious comorbidity with body mass index (BMI) of 36.0 to 36.9 in adult 10/14/2013  . Migraines   . HTN (hypertension)   . Multiple allergies     Rachel Quinn-Brown, PT, DPT Physical Therapist with [facility]  05/06/2017 5:45 PM    [facility] [address] [place], , [address] Phone: [phone]   Fax:  [phone]  Name: Deanna Friedman MRN: [id number] Date of Birth: 05/08/1976   

## 2017-05-07 ENCOUNTER — Encounter (HOSPITAL_COMMUNITY)
Admission: RE | Disposition: A | Discharge status: Home / Self Care | Payer: Self-pay | Source: Ambulatory Visit | Attending: Obstetrics and Gynecology

## 2017-05-07 ENCOUNTER — Encounter (HOSPITAL_COMMUNITY): Payer: Self-pay | Admitting: *Deleted

## 2017-05-07 ENCOUNTER — Ambulatory Visit (HOSPITAL_COMMUNITY)
Admission: RE | Admit: 2017-05-07 | Discharge: 2017-05-07 | Disposition: A | Discharge status: Home / Self Care | Payer: BC Managed Care – PPO | Source: Ambulatory Visit | Attending: Obstetrics and Gynecology | Admitting: Obstetrics and Gynecology

## 2017-05-07 ENCOUNTER — Telehealth (HOSPITAL_COMMUNITY): Payer: Self-pay

## 2017-05-07 ENCOUNTER — Ambulatory Visit (HOSPITAL_COMMUNITY): Payer: BC Managed Care – PPO | Admitting: Anesthesiology

## 2017-05-07 DIAGNOSIS — Z87891 Personal history of nicotine dependence: Secondary | ICD-10-CM | POA: Insufficient documentation

## 2017-05-07 DIAGNOSIS — K429 Umbilical hernia without obstruction or gangrene: Secondary | ICD-10-CM | POA: Insufficient documentation

## 2017-05-07 DIAGNOSIS — J45909 Unspecified asthma, uncomplicated: Secondary | ICD-10-CM | POA: Diagnosis not present

## 2017-05-07 DIAGNOSIS — Z302 Encounter for sterilization: Secondary | ICD-10-CM | POA: Diagnosis not present

## 2017-05-07 DIAGNOSIS — N920 Excessive and frequent menstruation with regular cycle: Secondary | ICD-10-CM

## 2017-05-07 DIAGNOSIS — Y838 Other surgical procedures as the cause of abnormal reaction of the patient, or of later complication, without mention of misadventure at the time of the procedure: Secondary | ICD-10-CM | POA: Diagnosis not present

## 2017-05-07 DIAGNOSIS — Z79899 Other long term (current) drug therapy: Secondary | ICD-10-CM | POA: Diagnosis not present

## 2017-05-07 DIAGNOSIS — E7800 Pure hypercholesterolemia, unspecified: Secondary | ICD-10-CM | POA: Insufficient documentation

## 2017-05-07 DIAGNOSIS — N9972 Accidental puncture and laceration of a genitourinary system organ or structure during other procedure: Secondary | ICD-10-CM | POA: Insufficient documentation

## 2017-05-07 DIAGNOSIS — I10 Essential (primary) hypertension: Secondary | ICD-10-CM | POA: Insufficient documentation

## 2017-05-07 HISTORY — PX: LAPAROSCOPIC BILATERAL SALPINGECTOMY: SHX5889

## 2017-05-07 HISTORY — PX: UMBILICAL HERNIA REPAIR: SHX196

## 2017-05-07 HISTORY — PX: DILITATION & CURRETTAGE/HYSTROSCOPY WITH NOVASURE ABLATION: SHX5568

## 2017-05-07 LAB — CBC WITH DIFFERENTIAL/PLATELET
Basophils Absolute: 0 10*3/uL (ref 0.0–0.1)
Basophils Relative: 0 %
EOS PCT: 2 %
Eosinophils Absolute: 0.1 10*3/uL (ref 0.0–0.7)
HCT: 39.1 % (ref 36.0–46.0)
Hemoglobin: 12.7 g/dL (ref 12.0–15.0)
LYMPHS ABS: 0.9 10*3/uL (ref 0.7–4.0)
LYMPHS PCT: 19 %
MCH: 31.3 pg (ref 26.0–34.0)
MCHC: 32.5 g/dL (ref 30.0–36.0)
MCV: 96.3 fL (ref 78.0–100.0)
MONO ABS: 0.3 10*3/uL (ref 0.1–1.0)
Monocytes Relative: 6 %
Neutro Abs: 3.6 10*3/uL (ref 1.7–7.7)
Neutrophils Relative %: 73 %
PLATELETS: 172 10*3/uL (ref 150–400)
RBC: 4.06 MIL/uL (ref 3.87–5.11)
RDW: 12.8 % (ref 11.5–15.5)
WBC: 4.9 10*3/uL (ref 4.0–10.5)

## 2017-05-07 SURGERY — SALPINGECTOMY, BILATERAL, LAPAROSCOPIC
Anesthesia: General | Site: Uterus

## 2017-05-07 MED ORDER — FENTANYL CITRATE (PF) 250 MCG/5ML IJ SOLN
INTRAMUSCULAR | Status: AC
  Filled 2017-05-07: qty 5

## 2017-05-07 MED ORDER — BUPIVACAINE-EPINEPHRINE (PF) 0.5% -1:200000 IJ SOLN
INTRAMUSCULAR | Status: DC | PRN
  Administered 2017-05-07: 10 mL via PERINEURAL
  Administered 2017-05-07: 20 mL via PERINEURAL

## 2017-05-07 MED ORDER — ONDANSETRON HCL 4 MG/2ML IJ SOLN
INTRAMUSCULAR | Status: DC | PRN
  Administered 2017-05-07: 4 mg via INTRAVENOUS

## 2017-05-07 MED ORDER — LIDOCAINE HCL (PF) 1 % IJ SOLN
INTRAMUSCULAR | Status: AC
  Filled 2017-05-07: qty 5

## 2017-05-07 MED ORDER — MIDAZOLAM HCL 2 MG/2ML IJ SOLN
INTRAMUSCULAR | Status: AC
  Filled 2017-05-07: qty 2

## 2017-05-07 MED ORDER — ROCURONIUM BROMIDE 50 MG/5ML IV SOLN
INTRAVENOUS | Status: AC
  Filled 2017-05-07: qty 1

## 2017-05-07 MED ORDER — 0.9 % SODIUM CHLORIDE (POUR BTL) OPTIME
TOPICAL | Status: DC | PRN
  Administered 2017-05-07: 1000 mL

## 2017-05-07 MED ORDER — LIDOCAINE HCL (CARDIAC) 20 MG/ML IV SOLN
INTRAVENOUS | Status: DC | PRN
  Administered 2017-05-07: 40 mg via INTRAVENOUS

## 2017-05-07 MED ORDER — PROPOFOL 10 MG/ML IV BOLUS
INTRAVENOUS | Status: AC
  Filled 2017-05-07: qty 40

## 2017-05-07 MED ORDER — SUGAMMADEX SODIUM 500 MG/5ML IV SOLN
INTRAVENOUS | Status: AC
  Filled 2017-05-07: qty 5

## 2017-05-07 MED ORDER — SODIUM CHLORIDE 0.9 % IR SOLN
Status: DC | PRN
  Administered 2017-05-07: 3000 mL

## 2017-05-07 MED ORDER — LACTATED RINGERS IV SOLN
INTRAVENOUS | Status: DC
  Administered 2017-05-07 (×2): via INTRAVENOUS

## 2017-05-07 MED ORDER — PROPOFOL 10 MG/ML IV BOLUS
INTRAVENOUS | Status: DC | PRN
  Administered 2017-05-07: 150 mg via INTRAVENOUS

## 2017-05-07 MED ORDER — BUPIVACAINE HCL (PF) 0.25 % IJ SOLN
INTRAMUSCULAR | Status: AC
  Filled 2017-05-07: qty 30

## 2017-05-07 MED ORDER — ONDANSETRON HCL 4 MG/2ML IJ SOLN
INTRAMUSCULAR | Status: AC
  Filled 2017-05-07: qty 2

## 2017-05-07 MED ORDER — SUGAMMADEX SODIUM 500 MG/5ML IV SOLN
INTRAVENOUS | Status: DC | PRN
  Administered 2017-05-07: 179.6 mg via INTRAVENOUS

## 2017-05-07 MED ORDER — BUPIVACAINE-EPINEPHRINE (PF) 0.5% -1:200000 IJ SOLN
INTRAMUSCULAR | Status: AC
  Filled 2017-05-07: qty 30

## 2017-05-07 MED ORDER — MIDAZOLAM HCL 2 MG/2ML IJ SOLN
1.0000 mg | INTRAMUSCULAR | Status: AC
  Administered 2017-05-07: 2 mg via INTRAVENOUS

## 2017-05-07 MED ORDER — ONDANSETRON HCL 4 MG/2ML IJ SOLN
4.0000 mg | Freq: Once | INTRAMUSCULAR | Status: AC
  Administered 2017-05-07: 4 mg via INTRAVENOUS

## 2017-05-07 MED ORDER — ROCURONIUM BROMIDE 100 MG/10ML IV SOLN
INTRAVENOUS | Status: DC | PRN
  Administered 2017-05-07: 40 mg via INTRAVENOUS

## 2017-05-07 MED ORDER — FENTANYL CITRATE (PF) 100 MCG/2ML IJ SOLN
INTRAMUSCULAR | Status: DC | PRN
  Administered 2017-05-07 (×5): 50 ug via INTRAVENOUS

## 2017-05-07 MED ORDER — FENTANYL CITRATE (PF) 100 MCG/2ML IJ SOLN
25.0000 ug | INTRAMUSCULAR | Status: DC | PRN
  Administered 2017-05-07 (×2): 50 ug via INTRAVENOUS
  Filled 2017-05-07: qty 2

## 2017-05-07 MED ORDER — HYDROCODONE-ACETAMINOPHEN 5-325 MG PO TABS: 1.0000 | ORAL_TABLET | Freq: Four times a day (QID) | ORAL | 0 refills | Status: DC | PRN

## 2017-05-07 SURGICAL SUPPLY — 58 items
ABLATOR ENDOMETRIAL BIPOLAR (ABLATOR) ×4 IMPLANT
BAG HAMPER (MISCELLANEOUS) ×4 IMPLANT
BANDAGE STRIP 1X3 FLEXIBLE (GAUZE/BANDAGES/DRESSINGS) ×12 IMPLANT
BLADE SURG SZ11 CARB STEEL (BLADE) ×4 IMPLANT
CATH ROBINSON RED A/P 16FR (CATHETERS) ×4 IMPLANT
CLOSURE STERI-STRIP 1/4X4 (GAUZE/BANDAGES/DRESSINGS) ×4 IMPLANT
CLOTH BEACON ORANGE TIMEOUT ST (SAFETY) ×4 IMPLANT
COVER LIGHT HANDLE STERIS (MISCELLANEOUS) ×8 IMPLANT
DECANTER SPIKE VIAL GLASS SM (MISCELLANEOUS) ×4 IMPLANT
DURAPREP 26ML APPLICATOR (WOUND CARE) ×4 IMPLANT
ELECT REM PT RETURN 9FT ADLT (ELECTROSURGICAL) ×4
ELECTRODE REM PT RTRN 9FT ADLT (ELECTROSURGICAL) ×3 IMPLANT
FORMALIN 10 PREFIL 120ML (MISCELLANEOUS) ×4 IMPLANT
GLOVE BIOGEL M 7.0 STRL (GLOVE) ×1 IMPLANT
GLOVE BIOGEL PI IND STRL 7.0 (GLOVE) ×3 IMPLANT
GLOVE BIOGEL PI IND STRL 9 (GLOVE) ×3 IMPLANT
GLOVE BIOGEL PI INDICATOR 7.0 (GLOVE) ×3
GLOVE BIOGEL PI INDICATOR 9 (GLOVE) ×1
GLOVE ECLIPSE 9.0 STRL (GLOVE) ×8 IMPLANT
GOWN SPEC L3 XXLG W/TWL (GOWN DISPOSABLE) ×8 IMPLANT
GOWN STRL REUS W/TWL LRG LVL3 (GOWN DISPOSABLE) ×4 IMPLANT
INST SET HYSTEROSCOPY (KITS) ×4 IMPLANT
INST SET LAPROSCOPIC GYN AP (KITS) ×4 IMPLANT
INST SET MINOR GENERAL (KITS) ×4 IMPLANT
IV NS IRRIG 3000ML ARTHROMATIC (IV SOLUTION) ×4 IMPLANT
KIT ROOM TURNOVER AP CYSTO (KITS) ×4 IMPLANT
KIT ROOM TURNOVER APOR (KITS) ×4 IMPLANT
MANIFOLD NEPTUNE II (INSTRUMENTS) ×4 IMPLANT
NDL HYPO 25X1 1.5 SAFETY (NEEDLE) ×3 IMPLANT
NEEDLE HYPO 25X1 1.5 SAFETY (NEEDLE) ×4 IMPLANT
NEEDLE INSUFFLATION 120MM (ENDOMECHANICALS) ×4 IMPLANT
NS IRRIG 1000ML POUR BTL (IV SOLUTION) ×4 IMPLANT
PACK MINOR (CUSTOM PROCEDURE TRAY) ×4 IMPLANT
PACK PERI GYN (CUSTOM PROCEDURE TRAY) ×4 IMPLANT
PAD ARMBOARD 7.5X6 YLW CONV (MISCELLANEOUS) ×4 IMPLANT
PAD TELFA 3X4 1S STER (GAUZE/BANDAGES/DRESSINGS) ×4 IMPLANT
SET BASIN LINEN APH (SET/KITS/TRAYS/PACK) ×4 IMPLANT
SET IRRIG Y TYPE TUR BLADDER L (SET/KITS/TRAYS/PACK) ×4 IMPLANT
SHEARS HARMONIC ACE PLUS 36CM (ENDOMECHANICALS) ×4 IMPLANT
SLEEVE ENDOPATH XCEL 5M (ENDOMECHANICALS) ×8 IMPLANT
SOL PREP PROV IODINE SCRUB 4OZ (MISCELLANEOUS) ×4 IMPLANT
SOLUTION ANTI FOG 6CC (MISCELLANEOUS) ×4 IMPLANT
SPONGE GAUZE 2X2 8PLY STRL LF (GAUZE/BANDAGES/DRESSINGS) ×4 IMPLANT
STAPLER VISISTAT (STAPLE) ×4 IMPLANT
STRIP CLOSURE SKIN 1/4X3 (GAUZE/BANDAGES/DRESSINGS) ×1 IMPLANT
SUT ETHIBOND NAB MO 7 #0 18IN (SUTURE) ×4 IMPLANT
SUT PROLENE 2 0 SH 30 (SUTURE) ×1 IMPLANT
SUT VIC AB 2-0 CT2 27 (SUTURE) ×4 IMPLANT
SUT VIC AB 3-0 SH 27 (SUTURE) ×4
SUT VIC AB 3-0 SH 27X BRD (SUTURE) ×3 IMPLANT
SUT VIC AB 4-0 PS2 27 (SUTURE) ×4 IMPLANT
SYR 30ML LL (SYRINGE) ×4 IMPLANT
SYR BULB IRRIGATION 50ML (SYRINGE) ×1 IMPLANT
SYR CONTROL 10ML LL (SYRINGE) ×4 IMPLANT
SYRINGE 10CC LL (SYRINGE) ×4 IMPLANT
TROCAR XCEL NON-BLD 5MMX100MML (ENDOMECHANICALS) ×4 IMPLANT
TUBING INSUFFLATION (TUBING) ×4 IMPLANT
WARMER LAPAROSCOPE (MISCELLANEOUS) ×4 IMPLANT

## 2017-05-07 NOTE — Transfer of Care (Signed)
Immediate Anesthesia Transfer of Care Note  Patient: Deanna BayleyJodi H Friedman  Procedure(s) Performed: LAPAROSCOPIC BILATERAL SALPINGECTOMY (Bilateral Abdomen) DILATATION & CURETTAGE/HYSTEROSCOPY (N/A Uterus) HERNIA REPAIR UMBILICAL ADULT (N/A Abdomen)  Patient Location: PACU  Anesthesia Type:General  Level of Consciousness: awake, alert  and oriented  Airway & Oxygen Therapy: Patient Spontanous Breathing  Post-op Assessment: Report given to RN and Post -op Vital signs reviewed and stable  Post vital signs: Reviewed and stable  Last Vitals:  Vitals:   05/07/17 1035 05/07/17 1309  BP:    Pulse:    Resp: (!) 24   Temp:  36.5 C  SpO2: 99%     Last Pain:  Vitals:   05/07/17 1309  TempSrc:   PainSc: 5       Patients Stated Pain Goal: 8 (05/07/17 1309)  Complications: No apparent anesthesia complications

## 2017-05-07 NOTE — Anesthesia Preprocedure Evaluation (Signed)
Anesthesia Evaluation  Patient identified by MRN, date of birth, ID band Patient awake    Reviewed: Allergy & Precautions, NPO status , Patient's Chart, lab work & pertinent test results  History of Anesthesia Complications (+) Emergence Delirium and history of anesthetic complications  Airway Mallampati: II  TM Distance: >3 FB     Dental  (+) Teeth Intact   Pulmonary asthma , former smoker,    Pulmonary exam normal        Cardiovascular hypertension, Pt. on medications  Rhythm:Regular Rate:Normal     Neuro/Psych  Headaches, PSYCHIATRIC DISORDERS (ADHD)    GI/Hepatic negative GI ROS, Neg liver ROS,   Endo/Other  negative endocrine ROS  Renal/GU negative Renal ROS     Musculoskeletal negative musculoskeletal ROS (+)   Abdominal   Peds  Hematology negative hematology ROS (+) anemia ,   Anesthesia Other Findings   Reproductive/Obstetrics                             Anesthesia Physical Anesthesia Plan  ASA: II  Anesthesia Plan: General   Post-op Pain Management:    Induction: Intravenous  PONV Risk Score and Plan:   Airway Management Planned: Oral ETT  Additional Equipment:   Intra-op Plan:   Post-operative Plan: Extubation in OR  Informed Consent: I have reviewed the patients History and Physical, chart, labs and discussed the procedure including the risks, benefits and alternatives for the proposed anesthesia with the patient or authorized representative who has indicated his/her understanding and acceptance.     Plan Discussed with:   Anesthesia Plan Comments:         Anesthesia Quick Evaluation

## 2017-05-07 NOTE — Op Note (Signed)
Please see the brief OP Note for details of procedure.  Note: Ablation not able to be done due to perforation of the myometrium in the left cornual area during Hysteroscopy, dilation and Curettage. No suspected intraabdominal injury , felt most likely ocurring during dilation of cervix.

## 2017-05-07 NOTE — Brief Op Note (Addendum)
05/07/2017  1:35 PM  PATIENT:  Deanna Friedman  41 y.o. female  PRE-OPERATIVE DIAGNOSIS:  Sterilization, umbilical hernia, menorrhagia  POST-OPERATIVE DIAGNOSIS:  desires sterilization, umbilical hernia, Menorrhagia Uterine perforation, recognized :  PROCEDURE:  Procedure(s): LAPAROSCOPIC BILATERAL SALPINGECTOMY (Bilateral) DILATATION & CURETTAGE/HYSTEROSCOPY (N/A) HERNIA REPAIR UMBILICAL ADULT (N/A)  Note unable to complete endometrial ablation due to the COMPLICATION of PERFORATION OF THE LEFT CORNUAL AREA OF THE UTERUS DURING HYSTEROSCOPY, DILATION & CURETTAGE. This precludes proceeding with ablation at this time. SURGEON:  Surgeon(s) and Role:    * Tilda BurrowFerguson, Rebeccah Ivins V, MD - Primary  PHYSICIAN ASSISTANT:   ASSISTANTS: none   ANESTHESIA:   local and general  EBL:  10 mL   BLOOD ADMINISTERED:none  DRAINS: none   LOCAL MEDICATIONS USED:  MARCAINE    and Amount: 30 ml  SPECIMEN:  Source of Specimen:  1. fallopian tubes. 2. endometrial curettings.  DISPOSITION OF SPECIMEN:  PATHOLOGY  COUNTS:  YES  TOURNIQUET:  * No tourniquets in log *  DICTATION: .Dragon Dictation  PLAN OF CARE: Discharge to home after PACU  PATIENT DISPOSITION:  PACU - hemodynamically stable.   Delay start of Pharmacological VTE agent (>24hrs) due to surgical blood loss or risk of bleeding: not applicable Indications: 41 year old female undergoing sterilization with plans for endometrial ablation Details of procedure: Patient was taken to the operating room prepped and draped for a combined abdominal and vaginal procedure with timeout conducted, and procedure confirmed by surgical team.  A vertical 1.5 cm incision was made across the umbilicus with sharp dissection down to the backside of hernia tissue and then Veress needle used to achieve pneumoretroperitoneum by easily penetration through the hernia sac into the abdominal cavity with pneumoperitoneum under very low pressures, 5-10 mmHg pressure.  An  11 mm laparoscopic trocar was inserted through the umbilical site.  Suprapubic and right lower quadrant 5 mm trochars were placed under direct visualization.  The direct attention was directed to the pelvis.  The uterus was mobile anteverted normal in appearance.  It could be elevated up and was able to be mobile to the point that the left round ligament could be grasped elevated and the left fallopian tube fully identified.  Left ovary was grossly normal.  There were no adhesions in the pelvis.  Harmonic scalpel ACE 7 was then used to amputate the left fallopian tube off of the mesal salpinx.  Specimen was extracted through the suprapubic port.  The right fallopian tube was identified elevated and a similar procedure performed on the right side.  Saline was instilled in the abdomen times 120 cc, laparoscopic trochars having been removed, and  Umbilical herniorrhaphy followed Attention was directed to the umbilicus where the hernia sac could be identified elevated and trimmed of its redundant fatty tissue and membrane.  The upper and lower aspects to the  defect in the fascia could then be grasped with Coker clamps and pulled upward, and a series of interrupted 2-0 Prolene sutures x3 used to close the hernia in a transverse fashion the subcutaneous fatty tissue was reapproximated with 4-0 Vicryl and this stitch continued as a subcuticular closure of the skin suprapubic and right lower quadrant trochars were closed with 4-0 Vicryl as well  Hysteroscopy D&C: At this point the surgeon was repositioned into the vaginal area legs elevated slightly from the candycane support stirrups speculum inserted and cervix grasped on the anterior lip with single-tooth tenaculum.  Paracervical block was applied using 20 cc of Marcaine solution the  uterus was sounded in the anteflexed position to a depth of 8-9 cm, and then dilated serially beginning it 6213 JamaicaFrench and dilated to 23 [place] using a series of dilators upon  completion the 30 degree rigid hysteroscope was inserted carefully under direct visualization, and the endometrial cavity inspected.  There were a couple of dimples in the center where the fundus of the uterus and been contacted by the probe and there was a small area in the cornea where the endometrial tissues were separated by where the dilators had contacted the uterine fundus.  The smooth sharp curettage was then performed and a generous amount of tissue obtained.  At no time was any suspicion that the diet that the curette extended on the expected depth of the uterine cavity.  The hysteroscopy was then repeated to visualize the uterus without satisfactorily evacuated and photos were taken.  The temples were not suspected to represent penetration through the uterus.  We then activated the NovaSure endometrial ablation device and its testing sequence initiated the integrity of the endometrial cavity test indicated that there was a cavity integrity problem so the device was removed, hysteroscope again inserted into the endometrial cavity and the 3 dimples in the fundus check.  The 2 in the center portion were very superficial and there was no depth at all to the defect, but the left cornual area showed a defect that extended into the myometrium so the hysteroscope was inserted into an tract all the way to the depth and it feels that there was a small defect sufficient to allow the hysteroscope to pass into the cul-de-sac.  The cul-de-sac did not have any blood but did have some saline solution in it.  There is nothing to suggest active bleeding at this time.  Having confirmed the uterine perforation plans for endometrial ablation were discontinued.  The hysteroscope was extracted, confirmed that there was no bleeding noted and on the uterine side of the defect and then the procedure terminated.  The patient was allowed to awaken and go to recovery room in good condition.  She received 2 g Ancef.  Surgical  procedure was reviewed with the patient's family member as per her designation and then reviewed with her in the recovery room.  Plans are for her to continue to be discharged home with plans for a telephone contact in the morning.  She has been given my contact numbers both cell phone and call numbers for tonight

## 2017-05-07 NOTE — Anesthesia Procedure Notes (Signed)
Procedure Name: Intubation Date/Time: 05/07/2017 11:26 AM Performed by: Andree Elk, Amy A, CRNA Pre-anesthesia Checklist: Patient identified, Patient being monitored, Timeout performed, Emergency Drugs available and Suction available Patient Re-evaluated:Patient Re-evaluated prior to induction Oxygen Delivery Method: Circle System Utilized Preoxygenation: Pre-oxygenation with 100% oxygen Induction Type: IV induction Ventilation: Mask ventilation without difficulty Laryngoscope Size: Mac and 3 Grade View: Grade I Tube type: Oral Tube size: 7.0 mm Number of attempts: 1 Airway Equipment and Method: Stylet Placement Confirmation: ETT inserted through vocal cords under direct vision,  positive ETCO2 and breath sounds checked- equal and bilateral Secured at: 21 cm Tube secured with: Tape Dental Injury: Teeth and Oropharynx as per pre-operative assessment

## 2017-05-07 NOTE — Interval H&P Note (Signed)
History and Physical Interval Note:  05/07/2017 11:10 AM  Deanna BayleyJodi H Friedman  has presented today for surgery, with the diagnosis of Sterilization  The various methods of treatment have been discussed with the patient and family. After consideration of risks, benefits and other options for treatment, the patient has consented to  Procedure(s): LAPAROSCOPIC BILATERAL SALPINGECTOMY (Bilateral) DILATATION & CURETTAGE/HYSTEROSCOPY WITH NOVASURE ABLATION (N/A) HERNIA REPAIR UMBILICAL ADULT (N/A) as a surgical intervention .  The patient's history has been reviewed, patient examined, no change in status, stable for surgery.  I have reviewed the patient's chart and labs.  Questions were answered to the patient's satisfaction.   Pregnancy test less than 1, Negative. Reviewed procedure and postop expectations.  Tilda BurrowJohn V Marlo Goodrich

## 2017-05-07 NOTE — Anesthesia Postprocedure Evaluation (Signed)
Anesthesia Post Note  Patient: Margaretmary BayleyJodi H Rotert  Procedure(s) Performed: LAPAROSCOPIC BILATERAL SALPINGECTOMY (Bilateral Abdomen) DILATATION & CURETTAGE/HYSTEROSCOPY (N/A Uterus) HERNIA REPAIR UMBILICAL ADULT (N/A Abdomen)  Patient location during evaluation: PACU Anesthesia Type: General Level of consciousness: awake and alert, oriented and patient cooperative Pain management: pain level controlled Vital Signs Assessment: post-procedure vital signs reviewed and stable Respiratory status: spontaneous breathing and respiratory function stable Cardiovascular status: stable : Nausea resolved. Anesthetic complications: no     Last Vitals:  Vitals:   05/07/17 1035 05/07/17 1309  BP:    Pulse:    Resp: (!) 24   Temp:  36.5 C  SpO2: 99%     Last Pain:  Vitals:   05/07/17 1309  TempSrc:   PainSc: 5                  ADAMS, AMY A

## 2017-05-07 NOTE — Telephone Encounter (Signed)
Pt can not come this date and will see us Friday

## 2017-05-07 NOTE — Discharge Instructions (Signed)
There was a complication during the hysteroscopy resulting in a small perforation of the top of the uterus close to the site where the left tube enters the uterus. This prevents us from completing the ablation process today. we Please contact Dr Emelda FearFerguson or the [facility] promptly for severe abdominal pain , vomiting or abdominal distention.     Hysteroscopy Hysteroscopy is a procedure used for looking inside the womb (uterus). It may be done for various reasons, including:  To evaluate abnormal bleeding, fibroid (benign, noncancerous) tumors, polyps, scar tissue (adhesions), and possibly cancer of the uterus.  To look for lumps (tumors) and other uterine growths.  To look for causes of why a woman cannot get pregnant (infertility), causes of recurrent loss of pregnancy (miscarriages), or a lost intrauterine device (IUD).  To perform a sterilization by blocking the fallopian tubes from inside the uterus.  In this procedure, a thin, flexible tube with a tiny light and camera on the end of it (hysteroscope) is used to look inside the uterus. A hysteroscopy should be done right after a menstrual period to be sure you are not pregnant. LET [facility] KNOW ABOUT:  Any allergies you have.  All medicines you are taking, including vitamins, herbs, eye drops, creams, and over-the-counter medicines.  Previous problems you or members of your family have had with the use of anesthetics.  Any blood disorders you have.  Previous surgeries you have had.  Medical conditions you have. RISKS AND COMPLICATIONS Generally, this is a safe procedure. However, as with any procedure, complications can occur. Possible complications include:  Putting a hole in the uterus.  Excessive bleeding.  Infection.  Damage to the cervix.  Injury to other organs.  Allergic reaction to medicines.  Too much fluid used in the uterus for the procedure.  BEFORE THE  PROCEDURE  Ask your health care provider about changing or stopping any regular medicines.  Do not take aspirin or blood thinners for 1 week before the procedure, or as directed by your health care provider. These can cause bleeding.  If you smoke, do not smoke for 2 weeks before the procedure.  In some cases, a medicine is placed in the cervix the day before the procedure. This medicine makes the cervix have a larger opening (dilate). This makes it easier for the instrument to be inserted into the uterus during the procedure.  Do not eat or drink anything for at least 8 hours before the surgery.  Arrange for someone to take you home after the procedure. PROCEDURE  You may be given a medicine to relax you (sedative). You may also be given one of the following: ? A medicine that numbs the area around the cervix (local anesthetic). ? A medicine that makes you sleep through the procedure (general anesthetic).  The hysteroscope is inserted through the vagina into the uterus. The camera on the hysteroscope sends a picture to a TV screen. This gives the surgeon a good view inside the uterus.  During the procedure, air or a liquid is put into the uterus, which allows the surgeon to see better.  Sometimes, tissue is gently scraped from inside the uterus. These tissue samples are sent to a lab for testing. What to expect after the procedure  If you had a general anesthetic, you may be groggy for a couple hours after the procedure.  If you had a local anesthetic, you will be able to go home as soon as you are  stable and feel ready.  You may have some cramping. This normally lasts for a couple days.  You may have bleeding, which varies from light spotting for a few days to menstrual-like bleeding for 3-7 days. This is normal.  If your test results are not back during the visit, make an appointment with your health care provider to find out the results. This information is not intended to  replace advice given to you by your health care provider. Make sure you discuss any questions you have with your health care provider. Document Released: 08/13/2000 Document Revised: 10/13/2015 Document Reviewed: 12/04/2012 Elsevier Interactive Patient Education  2017 [employer]

## 2017-05-08 ENCOUNTER — Ambulatory Visit (HOSPITAL_COMMUNITY): Payer: BC Managed Care – PPO

## 2017-05-08 ENCOUNTER — Encounter (HOSPITAL_COMMUNITY): Payer: Self-pay | Admitting: Obstetrics and Gynecology

## 2017-05-09 ENCOUNTER — Encounter (HOSPITAL_COMMUNITY): Payer: BC Managed Care – PPO

## 2017-05-09 ENCOUNTER — Ambulatory Visit: Payer: BC Managed Care – PPO | Admitting: Family Medicine

## 2017-05-10 ENCOUNTER — Ambulatory Visit (HOSPITAL_COMMUNITY): Payer: BC Managed Care – PPO

## 2017-05-10 ENCOUNTER — Ambulatory Visit: Payer: BC Managed Care – PPO

## 2017-05-10 ENCOUNTER — Telehealth (HOSPITAL_COMMUNITY): Payer: Self-pay | Admitting: Family Medicine

## 2017-05-10 NOTE — Telephone Encounter (Signed)
05/10/17  pt called and cx but no reason given as to why

## 2017-05-13 ENCOUNTER — Ambulatory Visit (HOSPITAL_COMMUNITY): Payer: BC Managed Care – PPO

## 2017-05-13 ENCOUNTER — Telehealth (HOSPITAL_COMMUNITY): Payer: Self-pay | Admitting: Family Medicine

## 2017-05-13 NOTE — Telephone Encounter (Signed)
05/13/17  she called to say she doesn't have time to come in today, don't know what she was thinking scheduling for today

## 2017-05-15 ENCOUNTER — Encounter (HOSPITAL_COMMUNITY): Payer: Self-pay

## 2017-05-15 ENCOUNTER — Ambulatory Visit (HOSPITAL_COMMUNITY): Payer: BC Managed Care – PPO

## 2017-05-15 DIAGNOSIS — R262 Difficulty in walking, not elsewhere classified: Secondary | ICD-10-CM

## 2017-05-15 DIAGNOSIS — M25562 Pain in left knee: Secondary | ICD-10-CM

## 2017-05-15 DIAGNOSIS — R600 Localized edema: Secondary | ICD-10-CM

## 2017-05-15 DIAGNOSIS — M25662 Stiffness of left knee, not elsewhere classified: Secondary | ICD-10-CM

## 2017-05-15 DIAGNOSIS — M6281 Muscle weakness (generalized): Secondary | ICD-10-CM

## 2017-05-15 NOTE — Therapy (Signed)
[facility] [place], [place], [address] Phone: [phone]   Fax:  [phone]  Physical Therapy Treatment  Patient Details  Name: Deanna Friedman MRN: [id number] Date of Birth: [birth year] Referring Provider: Frederich Balding, MD   Encounter Date: 05/15/2017  PT End of Session - 05/15/17 1129    Visit Number  30    Number of Visits  37    Date for PT Re-Evaluation  05/20/17    Authorization Type  [place] Time Period  05/03/17 to 05/20/17    PT Start Time  1122    PT Stop Time  1205    PT Time Calculation (min)  43 min    Activity Tolerance  Patient tolerated treatment well;Patient limited by fatigue    Behavior During Therapy  [facility] for tasks assessed/performed       Past Medical History:  Diagnosis Date  . ADHD (attention deficit hyperactivity disorder)   . Allergy    food, meds, mold, dust  . Anemia   . Asthma   . Blood transfusion without reported diagnosis    childbirth  . Body aches 11/29/2014  . BV (bacterial vaginosis) 10/14/2013  . Complication of anesthesia    'I wake up combative".  . Contraceptive management 03/31/2014  . Decreased sex drive 2/40/9735  . Folliculitis 07/21/9922  . Heart murmur   . HTN (hypertension)   . Hypercholesteremia 10/09/2016  . Migraines   . Multiple allergies   . Obesity 10/14/2013  . Vaginal itching 12/30/2014    Past Surgical History:  Procedure Laterality Date  . ANTERIOR CRUCIATE LIGAMENT REPAIR Left 01/29/2017  . BREAST LUMPECTOMY Right    fibroadenoma  . DILATION AND CURETTAGE OF UTERUS  2011  . DILITATION & CURRETTAGE/HYSTROSCOPY WITH NOVASURE ABLATION N/A 05/07/2017   Procedure: DILATATION & CURETTAGE/HYSTEROSCOPY;  Surgeon: Jonnie Kind, MD;  Location: [facility];  Service: Gynecology;  Laterality: N/A;  . KNEE ARTHROSCOPY Bilateral   . LAPAROSCOPIC BILATERAL SALPINGECTOMY Bilateral 05/07/2017   Procedure: LAPAROSCOPIC BILATERAL  SALPINGECTOMY;  Surgeon: Jonnie Kind, MD;  Location: [facility];  Service: Gynecology;  Laterality: Bilateral;  . UMBILICAL HERNIA REPAIR N/A 05/07/2017   Procedure: HERNIA REPAIR UMBILICAL ADULT;  Surgeon: Jonnie Kind, MD;  Location: [facility];  Service: Gynecology;  Laterality: N/A;    There were no vitals filed for this visit.  Subjective Assessment - 05/15/17 1127    Subjective  Pt stated she is feeling good today.  Reports she has reduced compliance with HEP due to surgery (had tubes removed).      Patient Stated Goals  get back mobility to where I was before    Currently in Pain?  No/denies                      [facility] Adult PT Treatment/Exercise - 05/15/17 0001      Knee/Hip Exercises: Aerobic   Tread Mill  5 mins walking at 3.0 mph 2% elevation, increase to light jog at 3.5      Knee/Hip Exercises: Plyometrics   Other Plyometric Exercises  lateral bounding on BOSU 2x 10      Knee/Hip Exercises: Standing   Functional Squat  2 sets;10 reps    Functional Squat Limitations  split stance    Lunge Walking - Round Trips  2RT    Rebounder  SLS squat 10x yellow ball    Other Standing Knee Exercises  Nortic 10x on foam    Other Standing Knee Exercises  star gazer       Knee/Hip Exercises: Supine   Bridges  Both;2 sets;10 reps    Other Supine Knee/Hip Exercises  eccentric hamstring bridge walkout 2 x 10 reps               PT Short Term Goals - 05/03/17 5956      PT SHORT TERM GOAL #1   Title  Pt will be independent with HEP and perform consistently in order to maximize return to PLOF.    Time  3    Period  Weeks    Status  Achieved      PT SHORT TERM GOAL #2   Title  Pt will have improved L knee AROM to 5-100 deg to decrease pain and maximize overall function.    Baseline  10/26: 0-112    Time  3    Period  Weeks    Status  Achieved      PT SHORT TERM GOAL #3   Title  Pt will have improved MMT of hip musculature to 5/5 to maximize gait.      Time  3    Period  Weeks    Status  Achieved      PT SHORT TERM GOAL #4   Title  Pt will have decreased swelling to at least 15" on the L at joint line to decrease pain and maximize ROM.    Baseline  10/26" 14.5" joint line    Time  3    Period  Weeks    Status  Achieved        PT Long Term Goals - 05/03/17 3875      PT LONG TERM GOAL #1   Title  Pt will have improved L knee AROM to at least 0-120 deg to demo improved overall function and maximize sitting tolerance, gait, and stair ambulation    Baseline  12/14: 0-127    Time  6    Period  Weeks    Status  Achieved      PT LONG TERM GOAL #2   Title  Pt will have 5/5 L knee MMT to maximize gait and return to PLOF.    Baseline  12/14: 4+/5 L knee and hip    Time  6    Period  Weeks    Status  Partially Met      PT LONG TERM GOAL #3   Title  Pt will be able to perform SLS for at least 10 sec on LLE to demo improved function and maximize gait.     Baseline  10/26: 15 sec     Time  6    Period  Weeks    Status  Achieved      PT LONG TERM GOAL #4   Title  Pt will be able to perform squatting with no knee pain and proper mechanics, and be able to lift at leat 20# from floor to chest in order to maximize ability to perform work and house/yard duties.     Baseline  12/14: great form and able to lift 20# floor to chest, but had increased anterior knee pain; great form with squatting down to 18" box but had anterior knee pain    Time  6    Period  Weeks    Status  Partially Met      PT LONG TERM GOAL #5   Title  Pt  will report being able to return to her regular gym/exercise routine with no knee pain in order to maximize return to PLOF.     Baseline  12/14: has not returned to the gym yet    Time  6    Period  Weeks    Status  On-going      PT LONG TERM GOAL #6   Title  Pt will be able to ambulate a flight of stairs with no handrail, good eccentric control, and no knee pain to demonstrate improved functional knee strength and  maximize work and community access.     Time  6    Period  Weeks    Status  On-going      PT LONG TERM GOAL #7   Title  Pt will be able to perform L SL sit <> stand and L SL squat at least 3/5 times with no UE and no evidence of knee valgus to demo improved functional strength of LLE and decrese risk for reinjury.    Baseline  12/14: L SL sit <> stand: increased knee valgus and anterior knee pain but able to do 5 reps;  L SL squat: only able to perform 1/5 reps and had increased valgus and anterior knee pain    Time  6    Period  Weeks    Status  On-going            Plan - 05/15/17 1208    Clinical Impression Statement  Pt continues to make excellent progress in therapy.  Progressed this session with beginning of light job with cueing to improve mechanics.  Progressed eccentric hamstring strengthening this session with visible muscle fatigue with task, cueing to improve mechanics.  No reports of pain thorugh session.      Rehab Potential  Good    PT Frequency  3x / week    PT Duration  3 weeks    PT Treatment/Interventions  ADLs/Self Care Home Management;Cryotherapy;Electrical Stimulation;Moist Heat;Ultrasound;DME Instruction;Gait training;Stair training;Functional mobility training;Therapeutic activities;Therapeutic exercise;Balance training;Neuromuscular re-education;Patient/family education;Manual techniques;Wheelchair mobility training;Passive range of motion;Scar mobilization;Dry needling;Energy conservation;Taping    PT Next Visit Plan  Reassess next session with advanced HEP.      PT Home Exercise Plan  11/21: told pt to discontinue initial HEP but to continue TKE, SAQ and add heel taps on step, sidestep with constant tension, wall squats and wall sits for strengthening; 12/14: LAQ, clams with BTB, lunge on 8" step + hip ER with BTB, eccentric HS sliders       Patient will benefit from skilled therapeutic intervention in order to improve the following deficits and impairments:   Abnormal gait, Decreased activity tolerance, Decreased balance, Decreased endurance, Decreased mobility, Decreased range of motion, Decreased strength, Difficulty walking, Hypomobility, Increased edema, Increased muscle spasms, Increased fascial restricitons, Impaired flexibility, Pain  Visit Diagnosis: Acute pain of left knee  Stiffness of left knee  Muscle weakness (generalized)  Localized edema  Difficulty in walking, not elsewhere classified     Problem List Patient Active Problem List   Diagnosis Date Noted  . Hypercholesteremia 10/09/2016  . Immunization refused 09/11/2016  . Intermittent asthma without complication 16/05/930  . Well woman exam with routine gynecological exam 01/13/2016  . Contraceptive management 03/31/2014  . Class 2 obesity due to excess calories without serious comorbidity with body mass index (BMI) of 36.0 to 36.9 in adult 10/14/2013  . Migraines   . HTN (hypertension)   . Multiple allergies    Ihor Austin, LPTA;  CBIS [phone]  Aldona Lento 05/15/2017, 12:12 PM  [facility] [address] [place], [place], [address] Phone: [phone]   Fax:  [phone]  Name: Deanna Friedman MRN: [id number] Date of Birth: 1975-12-23

## 2017-05-16 ENCOUNTER — Encounter: Payer: Self-pay | Admitting: Obstetrics and Gynecology

## 2017-05-16 ENCOUNTER — Ambulatory Visit: Payer: BC Managed Care – PPO | Admitting: Adult Health

## 2017-05-16 ENCOUNTER — Ambulatory Visit (INDEPENDENT_AMBULATORY_CARE_PROVIDER_SITE_OTHER): Payer: BC Managed Care – PPO | Admitting: Obstetrics and Gynecology

## 2017-05-16 ENCOUNTER — Ambulatory Visit (HOSPITAL_COMMUNITY): Payer: BC Managed Care – PPO

## 2017-05-16 VITALS — BP 120/64 | HR 84 | Ht 64.0 in | Wt 202.6 lb

## 2017-05-16 DIAGNOSIS — M25562 Pain in left knee: Secondary | ICD-10-CM | POA: Diagnosis not present

## 2017-05-16 DIAGNOSIS — R600 Localized edema: Secondary | ICD-10-CM

## 2017-05-16 DIAGNOSIS — Z09 Encounter for follow-up examination after completed treatment for conditions other than malignant neoplasm: Secondary | ICD-10-CM

## 2017-05-16 DIAGNOSIS — R262 Difficulty in walking, not elsewhere classified: Secondary | ICD-10-CM

## 2017-05-16 DIAGNOSIS — Z9889 Other specified postprocedural states: Secondary | ICD-10-CM

## 2017-05-16 DIAGNOSIS — M6281 Muscle weakness (generalized): Secondary | ICD-10-CM

## 2017-05-16 DIAGNOSIS — M25662 Stiffness of left knee, not elsewhere classified: Secondary | ICD-10-CM

## 2017-05-16 NOTE — Patient Instructions (Signed)
  SIT TO STAND - SINGLE LEG SQUAT - NO HANDS  Start by sitting in a chair. Next, using only one leg, raise up to standing without using your hands for support.   Single Leg Squat  Stand in front of a plyobox.  Standing on one foot lower the buttocks down and back to touch the box.  Return to standing by driving the stance leg down with the upper thigh and buttocks.  Stance leg should not rotated inward, nor the foot turn outward.   Walking Lunge  Keep arms at your sides and step forward with one leg. Drop into a lunge, then stand up and hold runners stance for one second. Then switch to lunge with the other leg. Keeps stomach muscles tight during the exercise.   Split Squat  Take a step forward with 1 leg and hold this position. Drop your back knee straight down, ending in a lunge position.    INVERTED BOSU - SQUAT  While standing and maintaining your balance on an inverted Bosu Ball squat and return to a standing position.    Knees should bend in line with the 2nd toe and not pass the front of the foot.    Lateral Bounding  Standing on your left leg, jump to your right and land on your right leg. Try to take as big of a leap as you can as long as you can stick the landing. Repeat.  Make sure to not let your knee cave in -- keep it to the outside of your big toe   Can also do this on the BOSU with the same concept, it will just be more difficult

## 2017-05-16 NOTE — Therapy (Signed)
[facility] [address] [place], [place], [address] Phone: [phone]   Fax:  [phone]  Physical Therapy Treatment/Discharge Summary   PHYSICAL THERAPY DISCHARGE SUMMARY  Visits from Start of Care: 31  Current functional level related to goals / functional outcomes: See below   Remaining deficits: See below   Education / Equipment: HEP Plan: Patient agrees to discharge.  Patient goals were partially met. Patient is being discharged due to the patient's request.  ?????       Patient Details  Name: TASHEKA HOUSEMAN MRN: [id number] Date of Birth: 1975-06-12 Referring Provider: Frederich Balding, MD   Encounter Date: 05/16/2017  PT End of Session - 05/16/17 1028    Visit Number  31    Number of Visits  37    Date for PT Re-Evaluation  05/20/17    Authorization Type  [place] Time Period  05/03/17 to 05/20/17    PT Start Time  1030    Activity Tolerance  Patient tolerated treatment well;Patient limited by fatigue    Behavior During Therapy  [facility] for tasks assessed/performed       Past Medical History:  Diagnosis Date  . ADHD (attention deficit hyperactivity disorder)   . Allergy    food, meds, mold, dust  . Anemia   . Asthma   . Blood transfusion without reported diagnosis    childbirth  . Body aches 11/29/2014  . BV (bacterial vaginosis) 10/14/2013  . Complication of anesthesia    'I wake up combative".  . Contraceptive management 03/31/2014  . Decreased sex drive 3/64/6803  . Folliculitis 06/21/2246  . Heart murmur   . HTN (hypertension)   . Hypercholesteremia 10/09/2016  . Migraines   . Multiple allergies   . Obesity 10/14/2013  . Vaginal itching 12/30/2014    Past Surgical History:  Procedure Laterality Date  . ANTERIOR CRUCIATE LIGAMENT REPAIR Left 01/29/2017  . BREAST LUMPECTOMY Right    fibroadenoma  . DILATION AND CURETTAGE OF UTERUS  2011  . DILITATION &  CURRETTAGE/HYSTROSCOPY WITH NOVASURE ABLATION N/A 05/07/2017   Procedure: DILATATION & CURETTAGE/HYSTEROSCOPY;  Surgeon: Jonnie Kind, MD;  Location: [facility];  Service: Gynecology;  Laterality: N/A;  . KNEE ARTHROSCOPY Bilateral   . LAPAROSCOPIC BILATERAL SALPINGECTOMY Bilateral 05/07/2017   Procedure: LAPAROSCOPIC BILATERAL SALPINGECTOMY;  Surgeon: Jonnie Kind, MD;  Location: [facility];  Service: Gynecology;  Laterality: Bilateral;  . UMBILICAL HERNIA REPAIR N/A 05/07/2017   Procedure: HERNIA REPAIR UMBILICAL ADULT;  Surgeon: Jonnie Kind, MD;  Location: [facility];  Service: Gynecology;  Laterality: N/A;      There were no vitals filed for this visit.  Subjective Assessment - 05/16/17 1029    Subjective  Pt states she was a little sore following yesterday, but not as bad as she expected.     Patient Stated Goals  get back mobility to where I was before    Currently in Pain?  No/denies         [facility] PT Assessment - 05/16/17 0001      Squat   Comments  5/5 with great form, 2-3/10 anterior knee pain      Single Leg Squat   Comments  able to complete 5/5 reps, however, increased knee valgus throughout and 2-3/10 anterior knee pain single leg      Sit to Stand   Comments  able to complete 5/5 reps with no LOB, however, inreased knee valgus  and unsteadiness, 2-3/10 anterior knee pain single leg      Strength   Left Hip Extension  4+/5 was 4+    Left Hip ABduction  4+/5 was 4+    Left Knee Flexion  5/5 was 4+    Left Knee Extension  5/5 was 4+          PT Education - 05/16/17 1028    Education provided  Yes    Education Details  discharge plans, HEP    Person(s) Educated  Patient    Methods  Explanation;Demonstration;Handout    Comprehension  Verbalized understanding;Returned demonstration       PT Short Term Goals - 05/16/17 1030      PT SHORT TERM GOAL #1   Title  Pt will be independent with HEP and perform consistently in order to maximize return to PLOF.     Time  3    Period  Weeks    Status  Achieved      PT SHORT TERM GOAL #2   Title  Pt will have improved L knee AROM to 5-100 deg to decrease pain and maximize overall function.    Baseline  10/26: 0-112    Time  3    Period  Weeks    Status  Achieved      PT SHORT TERM GOAL #3   Title  Pt will have improved MMT of hip musculature to 5/5 to maximize gait.     Time  3    Period  Weeks    Status  Achieved      PT SHORT TERM GOAL #4   Title  Pt will have decreased swelling to at least 15" on the L at joint line to decrease pain and maximize ROM.    Baseline  10/26" 14.5" joint line    Time  3    Period  Weeks    Status  Achieved        PT Long Term Goals - 05/16/17 1031      PT LONG TERM GOAL #1   Title  Pt will have improved L knee AROM to at least 0-120 deg to demo improved overall function and maximize sitting tolerance, gait, and stair ambulation    Baseline  12/14: 0-127    Time  6    Period  Weeks    Status  Achieved      PT LONG TERM GOAL #2   Title  Pt will have 5/5 L knee MMT to maximize gait and return to PLOF.    Baseline  12/27: 5/5 L knee    Time  6    Period  Weeks    Status  Achieved      PT LONG TERM GOAL #3   Title  Pt will be able to perform SLS for at least 10 sec on LLE to demo improved function and maximize gait.     Baseline  10/26: 15 sec     Time  6    Period  Weeks    Status  Achieved      PT LONG TERM GOAL #4   Title  Pt will be able to perform squatting with no knee pain and proper mechanics, and be able to lift at leat 20# from floor to chest in order to maximize ability to perform work and house/yard duties.     Baseline  12/27: great form with squatting, 2-3/10 anterior knee pain during, much improved from last reassessment  Time  6    Period  Weeks    Status  Partially Met      PT LONG TERM GOAL #5   Title  Pt will report being able to return to her regular gym/exercise routine with no knee pain in order to maximize return to PLOF.      Baseline  12/27: plans on starting back next week    Time  6    Period  Weeks    Status  On-going      PT LONG TERM GOAL #6   Title  Pt will be able to ambulate a flight of stairs with no handrail, good eccentric control, and no knee pain to demonstrate improved functional knee strength and maximize work and community access.     Time  6    Period  Weeks    Status  Achieved      PT LONG TERM GOAL #7   Title  Pt will be able to perform L SL sit <> stand and L SL squat at least 3/5 times with no UE and no evidence of knee valgus to demo improved functional strength of LLE and decrese risk for reinjury.    Baseline  12/27: L SL sit <> stand: increased knee valgus and anterior knee pain (much less than last reassessment) but able to do 5 reps;  L SL squat: able to perform 5/5 reps and had increased valgus and anterior knee pain (much improved from last reassessment)    Time  6    Period  Weeks    Status  On-going            Plan - 05/16/17 1103    Clinical Impression Statement  PT reassessed pt's goals and outcome measures as this was her last scheduled appointment. Pt has made great progress since starting therapy. Her main limiting factors that remain are control and strength during single leg activities ad mild anterior knee pain with squatting and single leg activities. Pt stated she had some increased anterior knee pain during jogging last session and asked if she could wear her brace when she tries this again and PT educated her that as long as it isn't locked and preventing her ROM then it was fine to wear it during jogging. Pt is planning on returning to the gym next week so PT provided pt with updated HEP for her to work on. PT educated pt to continue to work on single leg activities and strengthening and she verbalized understanding. Due to the pt's request to be d/c due to insurance coverage reasons, pt will be discharged this date.     Rehab Potential  Good    PT Frequency   3x / week    PT Duration  3 weeks    PT Treatment/Interventions  ADLs/Self Care Home Management;Cryotherapy;Electrical Stimulation;Moist Heat;Ultrasound;DME Instruction;Gait training;Stair training;Functional mobility training;Therapeutic activities;Therapeutic exercise;Balance training;Neuromuscular re-education;Patient/family education;Manual techniques;Wheelchair mobility training;Passive range of motion;Scar mobilization;Dry needling;Energy conservation;Taping    PT Next Visit Plan  discharged    PT Home Exercise Plan  11/21: told pt to discontinue initial HEP but to continue TKE, SAQ and add heel taps on step, sidestep with constant tension, wall squats and wall sits for strengthening; 12/14: LAQ, clams with BTB, lunge on 8" step + hip ER with BTB, eccentric HS sliders; 12/27: single leg sit to stands, single leg squats, squats on BOSU, walking lunging, split squats, lateral bounding on firm surface and on BOSU     Consulted and  Agree with Plan of Care  Patient       Patient will benefit from skilled therapeutic intervention in order to improve the following deficits and impairments:  Abnormal gait, Decreased activity tolerance, Decreased balance, Decreased endurance, Decreased mobility, Decreased range of motion, Decreased strength, Difficulty walking, Hypomobility, Increased edema, Increased muscle spasms, Increased fascial restricitons, Impaired flexibility, Pain  Visit Diagnosis: Acute pain of left knee  Stiffness of left knee  Muscle weakness (generalized)  Localized edema  Difficulty in walking, not elsewhere classified     Problem List Patient Active Problem List   Diagnosis Date Noted  . Hypercholesteremia 10/09/2016  . Immunization refused 09/11/2016  . Intermittent asthma without complication 32/54/9826  . Well woman exam with routine gynecological exam 01/13/2016  . Contraceptive management 03/31/2014  . Class 2 obesity due to excess calories without serious  comorbidity with body mass index (BMI) of 36.0 to 36.9 in adult 10/14/2013  . Migraines   . HTN (hypertension)   . Multiple allergies        Geraldine Solar PT, DPT  [facility] [address] [place], [place], [address] Phone: [phone]   Fax:  [phone]  Name: ZAIRA IACOVELLI MRN: [id number] Date of Birth: [birth year]

## 2017-05-16 NOTE — Progress Notes (Addendum)
Patient ID: Deanna Friedman, female   DOB: [birth year], 41 y.o.   MRN: [id number]    Subjective:  Deanna Friedman is a 41 y.o. female now 9 days status post bilateral laparoscopic salpingectomy, D&C/ hysteroscopy, and umbilical hernia repair THAT WAS COMPLICATED BY UTERINE PERFORATION IN THE LEFT CORNUAL AREA BY THE UTERINE DILATOR(s).  She reports waxing and waning, moderate-severe, abdominal cramping that is slowly improving since surgery. SHE Considers herself fully recovered.    Review of Systems Negative   Diet:   normal   Bowel movements : normal.  Pain is controlled without any medications.  Objective:  BP 120/64 (BP Location: Right Arm, Patient Position: Sitting, Cuff Size: Normal)   Pulse 84   Ht 5\' 4"  (1.626 m)   Wt 202 lb 9.6 oz (91.9 kg)   BMI 34.78 kg/m  General:Well developed, well nourished.  No acute distress. Abdomen: Bowel sounds normal, soft, non-tender. Pelvic Exam: deferred  Incision(s):   Healing well, no drainage, no erythema, no hernia, no swelling, no dehiscence,  Assessment:  Post-Op 9 days s/p bilateral laparoscopic salpingectomy, D&C/ hysteroscopy, and umbilical hernia repair. With complication of uterine perforation with dilator.  Doing well postoperatively.   Plan:  1.Wound care discussed  At length s o as to explain procedure and complication. PHOTOS of case reviewed with the patient. 2. . current medications. 3. Activity restrictions: none 4. return to work: 1-2 weeks. 5. Follow up PRN .  By signing my name below, I, Diona BrownerJennifer Gorman, attest that this documentation has been prepared under the direction and in the presence of Tilda BurrowFerguson, Durrel Mcnee V, MD. Electronically Signed: Diona BrownerJennifer Gorman, Medical Scribe. 05/16/17. 1:33 PM.  I personally performed the services described in this documentation, which was SCRIBED in my presence. The recorded information has been reviewed and considered accurate. It has been edited as necessary during review. Tilda BurrowJohn V  Saifullah Jolley, MD

## 2017-05-17 ENCOUNTER — Ambulatory Visit: Payer: BC Managed Care – PPO | Admitting: Family Medicine

## 2017-05-21 HISTORY — PX: EYE SURGERY: SHX253

## 2017-05-29 ENCOUNTER — Telehealth: Payer: Self-pay | Admitting: Obstetrics and Gynecology

## 2017-06-06 ENCOUNTER — Encounter: Payer: Self-pay | Admitting: Family Medicine

## 2017-06-06 ENCOUNTER — Ambulatory Visit: Payer: BC Managed Care – PPO | Admitting: Family Medicine

## 2017-06-06 ENCOUNTER — Other Ambulatory Visit: Payer: Self-pay

## 2017-06-06 VITALS — BP 132/86 | HR 92 | Temp 98.5°F | Resp 18 | Ht 64.0 in | Wt 204.1 lb

## 2017-06-06 DIAGNOSIS — E6609 Other obesity due to excess calories: Secondary | ICD-10-CM

## 2017-06-06 DIAGNOSIS — I10 Essential (primary) hypertension: Secondary | ICD-10-CM | POA: Diagnosis not present

## 2017-06-06 DIAGNOSIS — Z6836 Body mass index (BMI) 36.0-36.9, adult: Secondary | ICD-10-CM

## 2017-06-06 NOTE — Progress Notes (Signed)
Chief Complaint  Patient presents with  . Follow-up    6 month  Patient is here for routine follow-up. She is compliant with her blood pressure medication and is well controlled.  No headaches dizzy spells chest pain shortness of breath or symptoms. She is overweight and trying to lose weight.  She was doing well and then had a knee injury, tore her ACL.  She had  surgery and rehab and was unable to exercise.  She is just now getting back on a treadmill.  She states she feels like she knows what she needs to do to lose weight.  She is trying to stay on a healthy diet. She had a tubal ligation while she was off. She has had intermittent asthma, no recent illness and no need for inhalers. Patient Active Problem List   Diagnosis Date Noted  . Hypercholesteremia 10/09/2016  . Intermittent asthma without complication 09/11/2016  . Well woman exam with routine gynecological exam 01/13/2016  . Contraceptive management 03/31/2014  . Class 2 obesity due to excess calories without serious comorbidity with body mass index (BMI) of 36.0 to 36.9 in adult 10/14/2013  . Migraines   . HTN (hypertension)   . Multiple allergies     Outpatient Encounter Medications as of 06/06/2017  Medication Sig  . albuterol (PROVENTIL HFA;VENTOLIN HFA) 108 (90 BASE) MCG/ACT inhaler Inhale 2 puffs into the lungs every 6 (six) hours as needed for wheezing or shortness of breath.  Marland Kitchen Bioflavonoid Products (VITAMIN C) CHEW Chew by mouth daily.  . cetirizine (ZYRTEC) 10 MG tablet Take 10 mg by mouth daily.  . Cholecalciferol (VITAMIN D PO) Take by mouth daily.  . Cyanocobalamin (VITAMIN B12 PO) Take by mouth daily.  . diphenhydrAMINE (BENADRYL) 25 MG tablet Take 25 mg by mouth every 6 (six) hours as needed.  Marland Kitchen EPINEPHrine (EPIPEN) 0.3 mg/0.3 mL IJ SOAJ injection Inject 0.3 mLs (0.3 mg total) into the muscle once.  . hydrochlorothiazide (MICROZIDE) 12.5 MG capsule TAKE 1 CAPSULE(12.5 MG) BY MOUTH DAILY  . losartan  (COZAAR) 50 MG tablet TAKE 1 TABLET(50 MG) BY MOUTH DAILY  . mometasone (ELOCON) 0.1 % cream Apply 1 application topically daily.  Marland Kitchen nystatin-triamcinolone (MYCOLOG II) cream Apply 1 application topically 2 (two) times daily.  . traMADol (ULTRAM) 50 MG tablet Take 50 mg by mouth every 6 (six) hours as needed for pain.  . [DISCONTINUED] HYDROcodone-acetaminophen (NORCO/VICODIN) 5-325 MG tablet Take 1 tablet by mouth every 6 (six) hours as needed for moderate pain. May take with ibuprofen  . [DISCONTINUED] medroxyPROGESTERone (DEPO-PROVERA) 150 MG/ML injection Inject 1 mL (150 mg total) into the muscle every 3 (three) months. (Patient not taking: Reported on 06/06/2017)   Facility-Administered Encounter Medications as of 06/06/2017  Medication  . triamcinolone acetonide (KENALOG) 10 MG/ML injection 10 mg    Allergies  Allergen Reactions  . Nutritional Supplements Anaphylaxis    Pitted fruit, highly reactive to cherries  . Shellfish Allergy Hives  . Other Hives    Pitted Fruits  . Aloe Hives  . Cephalexin Other (See Comments)    Severe headache  . Coconut Fatty Acids Hives    Coconut eaten  . Dust Mite Extract Hives  . Food Hives    Tree nuts  . Percodan [Oxycodone-Aspirin] Other (See Comments)    Serious headache    Review of Systems  Constitutional: Negative for activity change, appetite change and unexpected weight change.  HENT: Negative for congestion, dental problem, postnasal drip and rhinorrhea.  Eyes: Negative for redness and visual disturbance.  Respiratory: Negative for cough and shortness of breath.   Cardiovascular: Negative for chest pain, palpitations and leg swelling.  Gastrointestinal: Negative for abdominal pain, constipation and diarrhea.  Genitourinary: Negative for difficulty urinating, frequency and menstrual problem.  Musculoskeletal: Negative for arthralgias and back pain.  Neurological: Negative for dizziness and headaches.  Psychiatric/Behavioral:  Negative for dysphoric mood and sleep disturbance. The patient is not nervous/anxious.     BP 132/86 (BP Location: Left Arm, Patient Position: Sitting, Cuff Size: Normal)   Pulse 92   Temp 98.5 F (36.9 C) (Temporal)   Resp 18   Ht 5\' 4"  (1.626 m)   Wt 204 lb 1.9 oz (92.6 kg)   SpO2 98%   BMI 35.04 kg/m   Physical Exam  Constitutional: She is oriented to person, place, and time. She appears well-developed and well-nourished. No distress.  HENT:  Head: Normocephalic and atraumatic.  Eyes: Conjunctivae are normal. Pupils are equal, round, and reactive to light.  Cardiovascular: Normal rate, regular rhythm and normal heart sounds.  Pulmonary/Chest: Effort normal and breath sounds normal. No respiratory distress.  Musculoskeletal:  Mild antalgic gait.  Mild quad quad atrophy on left.  Lacks full extension on left leg.  Lymphadenopathy:    She has no cervical adenopathy.  Neurological: She is alert and oriented to person, place, and time.  Psychiatric: She has a normal mood and affect. Her behavior is normal. Thought content normal.    ASSESSMENT/PLAN:  1. Essential hypertension Controlled  2. Class 2 obesity due to excess calories without serious comorbidity with body mass index (BMI) of 36.0 to 36.9 in adult Weight reduction as discussed.  Diet and exercise recommended.   Patient Instructions  Try to exercise more Follow the low carb diet  See me in six months Call sooner for problems   Eustace MooreYvonne Sue Donda Friedli, MD

## 2017-06-06 NOTE — Patient Instructions (Signed)
Try to exercise more Follow the low carb diet  See me in six months Call sooner for problems

## 2017-06-18 NOTE — Telephone Encounter (Signed)
Chart entered in eror

## 2017-07-18 ENCOUNTER — Other Ambulatory Visit: Payer: Self-pay | Admitting: Adult Health

## 2017-07-18 MED ORDER — LOSARTAN POTASSIUM 50 MG PO TABS: ORAL_TABLET | ORAL | 3 refills | Status: DC

## 2017-07-18 NOTE — Progress Notes (Signed)
Refilled losartan 

## 2017-07-24 ENCOUNTER — Encounter: Payer: Self-pay | Admitting: Family Medicine

## 2017-07-24 ENCOUNTER — Ambulatory Visit (INDEPENDENT_AMBULATORY_CARE_PROVIDER_SITE_OTHER): Payer: BC Managed Care – PPO | Admitting: Family Medicine

## 2017-07-24 VITALS — BP 124/82 | HR 75 | Temp 98.5°F | Ht 64.0 in | Wt 205.8 lb

## 2017-07-24 DIAGNOSIS — I10 Essential (primary) hypertension: Secondary | ICD-10-CM | POA: Diagnosis not present

## 2017-07-24 DIAGNOSIS — E6609 Other obesity due to excess calories: Secondary | ICD-10-CM | POA: Diagnosis not present

## 2017-07-24 DIAGNOSIS — Z6836 Body mass index (BMI) 36.0-36.9, adult: Secondary | ICD-10-CM

## 2017-07-24 MED ORDER — TOPIRAMATE 25 MG PO CPSP: 25.0000 mg | ORAL_CAPSULE | Freq: Every day | ORAL | 0 refills | Status: DC

## 2017-07-24 MED ORDER — PHENTERMINE HCL 15 MG PO CAPS: 15.0000 mg | ORAL_CAPSULE | ORAL | 0 refills | Status: DC

## 2017-07-24 NOTE — Patient Instructions (Signed)
Take one of each pills together every morning Moderate activity to as tolerated Try a little every day Ice to knees after exercise Do not be afraid to take the ibuprofen  See me in one month

## 2017-07-24 NOTE — Progress Notes (Signed)
Chief Complaint  Patient presents with  . MyChart Visit    had tubes tied Dec 2018, stopped birth control continued weight gain   Patient is here for a routine follow-up.  She has had a difficult winter with her knee surgery, than her GYN surgery for a tubal ligation.  She has decreased her activity level.  She does not feel like she has appreciably increased her food intake, but continues to gain weight.  She is very unhappy to be back up over 200 pounds.  She feels like she is having some hormonal fluctuations going off of her Depo shots and having her tubes tied.  She is just now starting to have menstrual periods again.  She has times when she feels bloated and like she has fluid overload.  She is compliant with her medication.  Her blood pressure is well controlled.  She previously saw Dr. Fransico Him for weight loss and he had her on Qsymia.  She states this worked well for her.  She cannot afford it because of the name brand medicine.  She would like to try the medicine again, in the generic form.  I told her that the dose would not be the same, but she would get one prescription for Topamax and one prescription for phentermine which are both generic medicines and should be less expensive.  She must agree to exercise daily, reduce her calorie intake, and come in monthly while on this medicine.  I told her I would leave her on this medicine long enough to lose 15 pounds.  She will see me in 1 month.  She is cautioned against side effects, sedation with the Topamax and increased blood pressure palpitations with the phentermine.  Patient Active Problem List   Diagnosis Date Noted  . Hypercholesteremia 10/09/2016  . Intermittent asthma without complication 09/11/2016  . Well woman exam with routine gynecological exam 01/13/2016  . Contraceptive management 03/31/2014  . Class 2 obesity due to excess calories without serious comorbidity with body mass index (BMI) of 36.0 to 36.9 in adult 10/14/2013  .  Migraines   . HTN (hypertension)   . Multiple allergies     Outpatient Encounter Medications as of 07/24/2017  Medication Sig  . albuterol (PROVENTIL HFA;VENTOLIN HFA) 108 (90 BASE) MCG/ACT inhaler Inhale 2 puffs into the lungs every 6 (six) hours as needed for wheezing or shortness of breath.  Marland Kitchen Bioflavonoid Products (VITAMIN C) CHEW Chew by mouth daily.  . cetirizine (ZYRTEC) 10 MG tablet Take 10 mg by mouth daily.  . Cholecalciferol (VITAMIN D PO) Take by mouth daily.  . diphenhydrAMINE (BENADRYL) 25 MG tablet Take 25 mg by mouth every 6 (six) hours as needed.  Marland Kitchen EPINEPHrine (EPIPEN) 0.3 mg/0.3 mL IJ SOAJ injection Inject 0.3 mLs (0.3 mg total) into the muscle once.  . hydrochlorothiazide (MICROZIDE) 12.5 MG capsule TAKE 1 CAPSULE(12.5 MG) BY MOUTH DAILY  . losartan (COZAAR) 50 MG tablet TAKE 1 TABLET(50 MG) BY MOUTH DAILY  . mometasone (ELOCON) 0.1 % cream Apply 1 application topically daily.  . Cyanocobalamin (VITAMIN B12 PO) Take by mouth daily.  . phentermine 15 MG capsule Take 1 capsule (15 mg total) by mouth every morning.  . topiramate (TOPAMAX) 25 MG capsule Take 1 capsule (25 mg total) by mouth daily.   No facility-administered encounter medications on file as of 07/24/2017.     Allergies  Allergen Reactions  . Nutritional Supplements Anaphylaxis    Pitted fruit, highly reactive to cherries  .  Shellfish Allergy Hives  . Other Hives    Pitted Fruits  . Aloe Hives  . Cephalexin Other (See Comments)    Severe headache  . Coconut Fatty Acids Hives    Coconut eaten  . Dust Mite Extract Hives  . Food Hives    Tree nuts  . Percodan [Oxycodone-Aspirin] Other (See Comments)    Serious headache    Review of Systems  Constitutional: Positive for unexpected weight change. Negative for activity change and appetite change.  HENT: Negative for congestion, dental problem, postnasal drip and rhinorrhea.   Eyes: Negative for redness and visual disturbance.  Respiratory: Negative  for cough and shortness of breath.   Cardiovascular: Positive for leg swelling. Negative for chest pain and palpitations.       Puffiness, edema "all over"  Gastrointestinal: Negative for abdominal pain, constipation and diarrhea.  Genitourinary: Negative for difficulty urinating, frequency and menstrual problem.  Musculoskeletal: Negative for arthralgias and back pain.  Neurological: Negative for dizziness and headaches.  Psychiatric/Behavioral: Negative for dysphoric mood and sleep disturbance. The patient is not nervous/anxious.     BP 124/82 (BP Location: Left Arm, Patient Position: Sitting, Cuff Size: Normal)   Pulse 75   Temp 98.5 F (36.9 C) (Temporal)   Ht 5\' 4"  (1.626 m)   Wt 205 lb 12 oz (93.3 kg)   LMP 07/22/2017   SpO2 98%   BMI 35.32 kg/m   Physical Exam  Constitutional: She is oriented to person, place, and time. She appears well-developed and well-nourished. No distress.  HENT:  Head: Normocephalic and atraumatic.  Eyes: Conjunctivae are normal. Pupils are equal, round, and reactive to light.  Cardiovascular: Normal rate, regular rhythm and normal heart sounds.  Pulmonary/Chest: Effort normal and breath sounds normal. No respiratory distress.  Musculoskeletal:  Both knees with mild crepitus and tenderness.  Full range of motion  Lymphadenopathy:    She has no cervical adenopathy.  Neurological: She is alert and oriented to person, place, and time.  Psychiatric: She has a normal mood and affect. Her behavior is normal. Thought content normal.    ASSESSMENT/PLAN:  1. Essential hypertension Well-controlled  2. Class 2 obesity due to excess calories without serious comorbidity with body mass index (BMI) of 36.0 to 36.9 in adult Discussed diet.  Exercise.  Medications.  She has been to a nutritionist.  She knows what the diet should be like.  Discussed low carbs, fat reduced, avoiding snacking.  We discussed portions.  She has knee pain with exercise.  She should  exercise to tolerance every day.  Take an anti-inflammatory before exercise.  Ice her knees after exercise.   Patient Instructions  Take one of each pills together every morning Moderate activity to as tolerated Try a little every day Ice to knees after exercise Do not be afraid to take the ibuprofen  See me in one month    Eustace MooreYvonne Sue Milbert Bixler, MD

## 2017-08-21 ENCOUNTER — Encounter: Payer: Self-pay | Admitting: Family Medicine

## 2017-08-21 ENCOUNTER — Ambulatory Visit: Payer: BC Managed Care – PPO | Admitting: Family Medicine

## 2017-08-21 ENCOUNTER — Other Ambulatory Visit: Payer: Self-pay

## 2017-08-21 VITALS — BP 108/60 | HR 80 | Temp 98.5°F | Ht 64.0 in | Wt 202.1 lb

## 2017-08-21 DIAGNOSIS — Z6836 Body mass index (BMI) 36.0-36.9, adult: Secondary | ICD-10-CM

## 2017-08-21 DIAGNOSIS — E6609 Other obesity due to excess calories: Secondary | ICD-10-CM

## 2017-08-21 MED ORDER — TOPIRAMATE 25 MG PO CPSP: 25.0000 mg | ORAL_CAPSULE | Freq: Every day | ORAL | 3 refills | Status: DC

## 2017-08-21 MED ORDER — PHENTERMINE HCL 30 MG PO CAPS: 30.0000 mg | ORAL_CAPSULE | ORAL | 0 refills | Status: DC

## 2017-08-21 NOTE — Progress Notes (Signed)
Chief Complaint  Patient presents with  . Follow-up    weight loss   Patient is here for routine follow-up. She was seen last month and we discussed her obesity.  She previously was on Qsymia and was successful in losing some weight.  This is not covered by her insurance so I gave her prescription of Topamax and a prescription for phentermine to take together to see if it would assist.  We discussed diet, snacking, fried foods, carbohydrates, and calorie counting.  We discussed the importance of daily exercise.  She is here 1 month later having lost 3 pounds. She is discouraged to feel like she is working so hard to lose weight, and to feel like she is eating so little, to not see more success. I offered to increase her phentermine from 15-30 mg a day.  She is not having any hypertension, stimulation, or side effects from the medication. Understands that she will come back in a month and see Aliene Beams, MD, for follow-up.  I did explain to her that there is no guarantee that Dr. Tracie Harrier will continue her on appetite suppressants.  I did offer her a referral to Dr. Dalbert Garnet in medical bariatrics to assist with her weight loss journey.  She declined the referral today, wants to give 1 more month to try.  She will consider referral if not successful.  Patient Active Problem List   Diagnosis Date Noted  . Hypercholesteremia 10/09/2016  . Intermittent asthma without complication 09/11/2016  . Well woman exam with routine gynecological exam 01/13/2016  . Contraceptive management 03/31/2014  . Class 2 obesity due to excess calories without serious comorbidity with body mass index (BMI) of 36.0 to 36.9 in adult 10/14/2013  . Migraines   . HTN (hypertension)   . Multiple allergies     Outpatient Encounter Medications as of 08/21/2017  Medication Sig  . albuterol (PROVENTIL HFA;VENTOLIN HFA) 108 (90 BASE) MCG/ACT inhaler Inhale 2 puffs into the lungs every 6 (six) hours as needed for wheezing or  shortness of breath.  Marland Kitchen Bioflavonoid Products (VITAMIN C) CHEW Chew by mouth daily.  . cetirizine (ZYRTEC) 10 MG tablet Take 10 mg by mouth daily.  . Cholecalciferol (VITAMIN D PO) Take by mouth daily.  . Cyanocobalamin (VITAMIN B12 PO) Take by mouth daily.  . diphenhydrAMINE (BENADRYL) 25 MG tablet Take 25 mg by mouth every 6 (six) hours as needed.  Marland Kitchen EPINEPHrine (EPIPEN) 0.3 mg/0.3 mL IJ SOAJ injection Inject 0.3 mLs (0.3 mg total) into the muscle once.  . hydrochlorothiazide (MICROZIDE) 12.5 MG capsule TAKE 1 CAPSULE(12.5 MG) BY MOUTH DAILY  . losartan (COZAAR) 50 MG tablet TAKE 1 TABLET(50 MG) BY MOUTH DAILY  . mometasone (ELOCON) 0.1 % cream Apply 1 application topically daily.  Marland Kitchen topiramate (TOPAMAX) 25 MG capsule Take 1 capsule (25 mg total) by mouth daily.  . [DISCONTINUED] phentermine 15 MG capsule Take 1 capsule (15 mg total) by mouth every morning.  . [DISCONTINUED] topiramate (TOPAMAX) 25 MG capsule Take 1 capsule (25 mg total) by mouth daily.  . phentermine 30 MG capsule Take 1 capsule (30 mg total) by mouth every morning.   No facility-administered encounter medications on file as of 08/21/2017.     Allergies  Allergen Reactions  . Nutritional Supplements Anaphylaxis    Pitted fruit, highly reactive to cherries  . Shellfish Allergy Hives  . Other Hives    Pitted Fruits  . Aloe Hives  . Cephalexin Other (See Comments)  Severe headache  . Coconut Fatty Acids Hives    Coconut eaten  . Dust Mite Extract Hives  . Food Hives    Tree nuts  . Percodan [Oxycodone-Aspirin] Other (See Comments)    Serious headache    Review of Systems  Constitutional: Negative for activity change, appetite change and unexpected weight change.  HENT: Negative for congestion, dental problem, postnasal drip and rhinorrhea.   Eyes: Negative for redness and visual disturbance.  Respiratory: Negative for cough and shortness of breath.   Cardiovascular: Negative for chest pain, palpitations and  leg swelling.  Gastrointestinal: Negative for abdominal pain, constipation and diarrhea.  Genitourinary: Negative for difficulty urinating, frequency and menstrual problem.  Musculoskeletal: Negative for arthralgias and back pain.  Neurological: Negative for dizziness and headaches.  Psychiatric/Behavioral: Negative for dysphoric mood and sleep disturbance. The patient is not nervous/anxious.        Discouraged    BP 108/60   Pulse 80   Temp 98.5 F (36.9 C)   Ht 5\' 4"  (1.626 m)   Wt 202 lb 1.3 oz (91.7 kg)   LMP 07/22/2017   SpO2 97%   BMI 34.69 kg/m   Physical Exam  Constitutional: She is oriented to person, place, and time. She appears well-developed and well-nourished. No distress.  HENT:  Head: Normocephalic and atraumatic.  Eyes: Pupils are equal, round, and reactive to light. Conjunctivae are normal.  Cardiovascular: Normal rate, regular rhythm and normal heart sounds.  Pulmonary/Chest: Effort normal and breath sounds normal. No respiratory distress.  Lymphadenopathy:    She has no cervical adenopathy.  Neurological: She is alert and oriented to person, place, and time.  Psychiatric: She has a normal mood and affect. Her behavior is normal. Thought content normal.    ASSESSMENT/PLAN:  1. Class 2 obesity due to excess calories without serious comorbidity with body mass index (BMI) of 36.0 to 36.9 in adult    Patient Instructions  Continue to work on the food choices and activity I am increasing the phentermine to 30 mg a day Call if this causes jittery feelings or side effects  You will see Dr Tracie HarrierHagler in follow up   Eustace MooreYvonne Sue Jaaziah Schulke, MD

## 2017-08-21 NOTE — Patient Instructions (Signed)
Continue to work on the food choices and activity I am increasing the phentermine to 30 mg a day Call if this causes jittery feelings or side effects  You will see Dr Tracie HarrierHagler in follow up

## 2017-09-12 ENCOUNTER — Encounter: Payer: Self-pay | Admitting: Family Medicine

## 2017-09-12 ENCOUNTER — Other Ambulatory Visit: Payer: Self-pay

## 2017-09-12 ENCOUNTER — Ambulatory Visit: Payer: BC Managed Care – PPO | Admitting: Family Medicine

## 2017-09-12 VITALS — BP 114/80 | HR 85 | Temp 98.4°F | Resp 16 | Ht 64.0 in | Wt 195.0 lb

## 2017-09-12 DIAGNOSIS — I10 Essential (primary) hypertension: Secondary | ICD-10-CM

## 2017-09-12 DIAGNOSIS — Z23 Encounter for immunization: Secondary | ICD-10-CM | POA: Diagnosis not present

## 2017-09-12 DIAGNOSIS — E669 Obesity, unspecified: Secondary | ICD-10-CM

## 2017-09-12 DIAGNOSIS — E7800 Pure hypercholesterolemia, unspecified: Secondary | ICD-10-CM | POA: Diagnosis not present

## 2017-09-12 MED ORDER — PHENTERMINE HCL 30 MG PO CAPS: 30.0000 mg | ORAL_CAPSULE | ORAL | 0 refills | Status: DC

## 2017-09-12 NOTE — Patient Instructions (Addendum)
Weight watchers Beach body on demand 10 minutes a day-EXERCISE  Drink WATER!!!     Obesity, Adult Obesity is having too much body fat. If you have a BMI of 30 or more, you are obese. BMI is a number that explains how much body fat you have. Obesity is often caused by taking in (consuming) more calories than your body uses. Obesity can cause serious health problems. Changing your lifestyle can help to treat obesity. Follow these instructions at home: Eating and drinking   Follow advice from your doctor about what to eat and drink. Your doctor may tell you to: ? Cut down on (limit) fast foods, sweets, and processed snack foods. ? Choose low-fat options. For example, choose low-fat milk instead of whole milk. ? Eat 5 or more servings of fruits or vegetables every day. ? Eat at home more often. This gives you more control over what you eat. ? Choose healthy foods when you eat out. ? Learn what a healthy portion size is. A portion size is the amount of a certain food that is healthy for you to eat at one time. This is different for each person. ? Keep low-fat snacks available. ? Avoid sugary drinks. These include soda, fruit juice, iced tea that is sweetened with sugar, and flavored milk. ? Eat a healthy breakfast.  Drink enough water to keep your pee (urine) clear or pale yellow.  Do not go without eating for long periods of time (do not fast).  Do not go on popular or trendy diets (fad diets). Physical Activity  Exercise often, as told by your doctor. Ask your doctor: ? What types of exercise are safe for you. ? How often you should exercise.  Warm up and stretch before being active.  Do slow stretching after being active (cool down).  Rest between times of being active. Lifestyle  Limit how much time you spend in front of your TV, computer, or video game system (be less sedentary).  Find ways to reward yourself that do not involve food.  Limit alcohol intake to no more  than 1 drink a day for nonpregnant women and 2 drinks a day for men. One drink equals 12 oz of beer, 5 oz of wine, or 1 oz of hard liquor. General instructions  Keep a weight loss journal. This can help you keep track of: ? The food that you eat. ? The exercise that you do.  Take over-the-counter and prescription medicines only as told by your doctor.  Take vitamins and supplements only as told by your doctor.  Think about joining a support group. Your doctor may be able to help with this.  Keep all follow-up visits as told by your doctor. This is important. Contact a doctor if:  You cannot meet your weight loss goal after you have changed your diet and lifestyle for 6 weeks. This information is not intended to replace advice given to you by your health care provider. Make sure you discuss any questions you have with your health care provider. Document Released: 07/30/2011 Document Revised: 10/13/2015 Document Reviewed: 02/23/2015 Elsevier Interactive Patient Education  2018 [employer]

## 2017-09-12 NOTE — Progress Notes (Signed)
Patient ID: Deanna Friedman, female    DOB: 1975/09/08, 42 y.o.   MRN: [id number]  Chief Complaint  Patient presents with  . Obesity    follow up. Phentermine increased last visit to 30mg     Allergies Nutritional supplements; Shellfish allergy; Other; Aloe; Cephalexin; Coconut fatty acids; Dust mite extract; Food; and Percodan [oxycodone-aspirin]  Subjective:   Deanna Friedman is a 42 y.o. female who presents to [facility] today.  HPI Here to establish care as a new patient visit and get a refill on her phentermine.  Is he been seen by Dr. Delton See and is transferring her care to me as her PCP.  She reports that she has been battling her weight for quite some time.  She reports that her weight usually ran around 130 pounds until about 11 years ago.  She reports that 11 years ago had a severe allergic reaction to dust mites and food. Was placed on prednisone on and off for 18 months. Ended up gaining weight, about 65 pounds during this time.  She reports that she has been on phentermine by Dr. Delton See for two months. Was initially on 15 mg for a month and it was increased to 30 mg last month. Denies any side effects. Was seen by Endocrine in the past, Dr. Fransico Him, for weight loss.  Was placed on Qsymia at that time and lost weight.  Eventually stop being seen at that office and decided to follow-up with her PCP for weight loss.  Over the past couple months she has lost 18 pounds.  Current goal weight is 150 pounds.  Reports that she eats a healthy diet.  Eats 3 meals a day and healthy snacks.  Does not get as much exercise as she wishes.  She does report frustration and irritability associated with her weight.  She is committed to losing weight.  She is currently married and has 3 children. 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  Denies any personal history of heart attack or stroke.  No family history of  sudden cardiac death.  Has hypertension which is controlled.  Denies any chest pain, shortness of breath, palpitations.  Denies any edema in her lower extremities.  Wishes to get a refill on phentermine at this time.  Has been to diet and nutritional counseling in the past.  Is tried weight watchers and Doylene Bode in the past.  Does not wish to have bariatric surgery.  Reports that she takes all her medications as directed.  No side effects.  Has never been told she was diabetic.   Past Medical History:  Diagnosis Date  . ADHD (attention deficit hyperactivity disorder)   . Allergy    food, meds, mold, dust  . Anemia   . Asthma   . Blood transfusion without reported diagnosis    childbirth  . Body aches 11/29/2014  . BV (bacterial vaginosis) 10/14/2013  . Complication of anesthesia    'I wake up combative".  . Contraceptive management 03/31/2014  . Decreased sex drive 08/27/8117  . Folliculitis 01/27/2015  . Heart murmur   . HTN (hypertension)   . Hypercholesteremia 10/09/2016  . Migraines   . Multiple allergies   . Obesity 10/14/2013  . Vaginal itching 12/30/2014    Past Surgical History:  Procedure Laterality Date  . ANTERIOR CRUCIATE LIGAMENT REPAIR Left 01/29/2017  . BREAST LUMPECTOMY Right  fibroadenoma  . DILATION AND CURETTAGE OF UTERUS  2011  . DILITATION & CURRETTAGE/HYSTROSCOPY WITH NOVASURE ABLATION N/A 05/07/2017   Procedure: DILATATION & CURETTAGE/HYSTEROSCOPY;  Surgeon: Tilda Burrow, MD;  Location: [facility];  Service: Gynecology;  Laterality: N/A;  . KNEE ARTHROSCOPY Bilateral   . LAPAROSCOPIC BILATERAL SALPINGECTOMY Bilateral 05/07/2017   Procedure: LAPAROSCOPIC BILATERAL SALPINGECTOMY;  Surgeon: Tilda Burrow, MD;  Location: [facility];  Service: Gynecology;  Laterality: Bilateral;  . UMBILICAL HERNIA REPAIR N/A 05/07/2017   Procedure: HERNIA REPAIR UMBILICAL ADULT;  Surgeon: Tilda Burrow, MD;  Location: [facility];  Service: Gynecology;  Laterality: N/A;     Family History  Problem Relation Age of Onset  . Hypertension Mother   . Heart disease Mother   . Alcohol abuse Mother   . Arthritis Mother   . Asthma Mother   . Depression Mother   . Alcohol abuse Maternal Grandfather   . Hypertension Paternal Grandmother   . Diabetes Paternal Grandmother   . Cancer Paternal Grandfather        lung  . Graves' disease Sister   . Other Maternal Grandmother        was murdered  . Early death Maternal Grandmother   . Alcohol abuse Father   . Arthritis Father      Social History   Socioeconomic History  . Marital status: Married    Spouse name: Asher Muir  . Number of children: 3  . Years of education: 40  . Highest education level: Not on file  Occupational History  . Occupation: Runner, broadcasting/film/video    Comment: [employer]  . Financial resource strain: Not on file  . Food insecurity:    Worry: Not on file    Inability: Not on file  . Transportation needs:    Medical: Not on file    Non-medical: Not on file  Tobacco Use  . Smoking status: Former Smoker    Packs/day: 0.25    Years: 5.00    Pack years: 1.25    Types: Cigarettes    Last attempt to quit: 05/03/1999    Years since quitting: 18.3  . Smokeless tobacco: Never Used  Substance and Sexual Activity  . Alcohol use: Yes    Comment: social  . Drug use: No  . Sexual activity: Yes    Birth control/protection: Injection  Lifestyle  . Physical activity:    Days per week: Not on file    Minutes per session: Not on file  . Stress: Not on file  Relationships  . Social connections:    Talks on phone: Not on file    Gets together: Not on file    Attends religious service: Not on file    Active member of club or organization: Not on file    Attends meetings of clubs or organizations: Not on file    Relationship status: Not on file  Other Topics Concern  . Not on file  Social History Narrative   Art teacher   Lives with [place]   Yoga/exercises.     Review of  Systems  Constitutional: Negative for activity change, appetite change and fever.  HENT: Negative for trouble swallowing and voice change.   Eyes: Negative for visual disturbance.  Respiratory: Negative for cough, chest tightness, shortness of breath, wheezing and stridor.   Cardiovascular: Negative for chest pain, palpitations and leg swelling.  Gastrointestinal: Negative for abdominal pain, nausea and vomiting.  Genitourinary: Negative for dysuria, frequency and urgency.  Skin:  Negative for rash.  Neurological: Negative for dizziness, tremors, syncope and light-headedness.  Hematological: Negative for adenopathy.  Psychiatric/Behavioral: Negative for dysphoric mood and suicidal ideas. The patient is not nervous/anxious.    Current Outpatient Medications on File Prior to Visit  Medication Sig Dispense Refill  . albuterol (PROVENTIL HFA;VENTOLIN HFA) 108 (90 BASE) MCG/ACT inhaler Inhale 2 puffs into the lungs every 6 (six) hours as needed for wheezing or shortness of breath. 1 Inhaler 0  . Bioflavonoid Products (VITAMIN C) CHEW Chew by mouth daily.    . cetirizine (ZYRTEC) 10 MG tablet Take 10 mg by mouth daily.    . Cholecalciferol (VITAMIN D PO) Take by mouth daily.    . Cyanocobalamin (VITAMIN B12 PO) Take by mouth daily.    . diphenhydrAMINE (BENADRYL) 25 MG tablet Take 25 mg by mouth every 6 (six) hours as needed.    Marland Kitchen EPINEPHrine (EPIPEN) 0.3 mg/0.3 mL IJ SOAJ injection Inject 0.3 mLs (0.3 mg total) into the muscle once. 1 Device 0  . hydrochlorothiazide (MICROZIDE) 12.5 MG capsule TAKE 1 CAPSULE(12.5 MG) BY MOUTH DAILY 30 capsule 6  . losartan (COZAAR) 50 MG tablet TAKE 1 TABLET(50 MG) BY MOUTH DAILY 30 tablet 3  . mometasone (ELOCON) 0.1 % cream Apply 1 application topically daily. 45 g 1  . topiramate (TOPAMAX) 25 MG capsule Take 1 capsule (25 mg total) by mouth daily. 30 capsule 3   No current facility-administered medications on file prior to visit.      Objective:   BP  114/80 (BP Location: Left Arm, Patient Position: Sitting, Cuff Size: Large)   Pulse 85   Temp 98.4 F (36.9 C) (Oral)   Resp 16   Ht 5\' 4"  (1.626 m)   Wt 195 lb 0.6 oz (88.5 kg)   SpO2 98%   BMI 33.48 kg/m   Physical Exam  Constitutional: She is oriented to person, place, and time. She appears well-developed and well-nourished. No distress.  HENT:  Head: Normocephalic and atraumatic.  Eyes: Pupils are equal, round, and reactive to light.  Neck: Normal range of motion. Neck supple. No thyromegaly present.  Cardiovascular: Normal rate, regular rhythm and normal heart sounds.  Pulmonary/Chest: Effort normal and breath sounds normal. No respiratory distress.  Neurological: She is alert and oriented to person, place, and time. No cranial nerve deficit.  Skin: Skin is warm and dry.  Nursing note and vitals reviewed.    Assessment and Plan  1. Obesity (BMI 30.0-34.9) Patient counseled in detail regarding the risks of medication. Told to call or return to clinic if develop any worrisome signs or symptoms. Patient voiced understanding.  Diet, exercise, and weight loss modifications discussed. We discussed that she does not need to count her calories but all the calories that she takes and do count to her overall weight loss and/or weight gain. We discussed the types of foods that she should eat and we discussed getting some regular exercise and strength training.  We discussed the side effects and risks of phentermine in detail.  We discussed the risks of cardiovascular and psychiatric complications.  We discussed the psychological and habit-forming dependence of this medication.  We discussed that he can elevate blood pressure and cause pulmonary hypertension and cardiac manifestations that could not be reversed upon discontinuation of the medicine.  She voiced understanding of this. - phentermine 30 MG capsule; Take 1 capsule (30 mg total) by mouth every morning.  Dispense: 30 capsule;  Refill: 0   2. Essential hypertension  Well-controlled.  Continue current medications as directed. Lifestyle modifications discussed with patient including a diet emphasizing vegetables, fruits, and whole grains. Limiting intake of sodium to less than 2,400 mg per day.  Recommendations discussed include consuming low-fat dairy products, poultry, fish, legumes, non-tropical vegetable oils, and nuts; and limiting intake of sweets, sugar-sweetened beverages, and red meat. Discussed following a plan such as the Dietary Approaches to Stop Hypertension (DASH) diet. Patient to read up on this diet.    3. Hypercholesteremia Diet Terry and cholesterol goals discussed.  We will plan on rechecking cholesterol in the next several months.  She is due for complete physical examination in June.  Tetanus shot given today. Patient told to call with any questions, concerns, or side effects with the medication.  She was told at any point if she develops side effects with medication that she should discontinue medication and contact our office or go to the emergency department.  She voiced understanding. Office visit today was over 45 minutes.  Greater than 50% of office visit was spent counseling and coordinating care. Return in about 1 month (around 10/10/2017) for Follow up. Aliene Beamsachel Giah Fickett, MD 09/12/2017

## 2017-10-11 ENCOUNTER — Ambulatory Visit: Payer: BC Managed Care – PPO | Admitting: Family Medicine

## 2017-10-11 ENCOUNTER — Encounter: Payer: Self-pay | Admitting: Family Medicine

## 2017-10-11 VITALS — BP 118/82 | HR 93 | Resp 16 | Ht 64.0 in | Wt 192.0 lb

## 2017-10-11 DIAGNOSIS — E669 Obesity, unspecified: Secondary | ICD-10-CM

## 2017-10-11 DIAGNOSIS — E559 Vitamin D deficiency, unspecified: Secondary | ICD-10-CM

## 2017-10-11 DIAGNOSIS — R5383 Other fatigue: Secondary | ICD-10-CM

## 2017-10-11 DIAGNOSIS — I10 Essential (primary) hypertension: Secondary | ICD-10-CM | POA: Diagnosis not present

## 2017-10-11 DIAGNOSIS — E7800 Pure hypercholesterolemia, unspecified: Secondary | ICD-10-CM

## 2017-10-11 MED ORDER — PHENTERMINE HCL 30 MG PO CAPS: 30.0000 mg | ORAL_CAPSULE | ORAL | 0 refills | Status: DC

## 2017-10-11 NOTE — Progress Notes (Signed)
Patient ID: Deanna Friedman, female    DOB: [birth year], 41 y.o.   MRN: [id number]  No chief complaint on file.   Allergies Nutritional supplements; Shellfish allergy; Other; Aloe; Cephalexin; Coconut fatty acids; Dust mite extract; Food; and Percodan [oxycodone-aspirin]  Subjective:   Deanna Friedman is a 42 y.o. female who presents to [facility] today.  HPI Deanna Friedman presentshere for follow up and to get a refill of her phentermine.  She has only lost 3 pounds. Reports that for the past month has been driving to [facility] and back several days a week due to the fact that her uncle was in a motorcycle accident.  She reports he did pass away this week.  She reports that is been very stressful for her.  She has tried to continue eating healthy diet but has had to eat more on the road.  She is also been spending her free time in the car driving and is not has as much time to exercise.  She is looking forward to having time off in the summer.  She denies any side effects with the medication.  Denies any chest pain, shortness of breath, or swelling in her extremities.  Her blood pressure has been running well.  No side effects with her medication.  Trying to eat healthy.  Motivated to lose weight.  Denies any side effects with medication.  No reported  problems with her mood.  Is due to get her physical in June or July.  Would like to get the labs done before she comes in.   Past Medical History:  Diagnosis Date  . ADHD (attention deficit hyperactivity disorder)   . Allergy    food, meds, mold, dust  . Anemia   . Asthma   . Blood transfusion without reported diagnosis    childbirth  . Body aches 11/29/2014  . BV (bacterial vaginosis) 10/14/2013  . Complication of anesthesia    'I wake up combative".  . Contraceptive management 03/31/2014  . Decreased sex drive 08/27/8117  . Folliculitis 01/27/2015  . Heart murmur   . HTN (hypertension)   . Hypercholesteremia 10/09/2016  . Migraines     . Multiple allergies   . Obesity 10/14/2013  . Vaginal itching 12/30/2014    Past Surgical History:  Procedure Laterality Date  . ANTERIOR CRUCIATE LIGAMENT REPAIR Left 01/29/2017  . BREAST LUMPECTOMY Right    fibroadenoma  . DILATION AND CURETTAGE OF UTERUS  2011  . DILITATION & CURRETTAGE/HYSTROSCOPY WITH NOVASURE ABLATION N/A 05/07/2017   Procedure: DILATATION & CURETTAGE/HYSTEROSCOPY;  Surgeon: Tilda Burrow, MD;  Location: [facility];  Service: Gynecology;  Laterality: N/A;  . KNEE ARTHROSCOPY Bilateral   . LAPAROSCOPIC BILATERAL SALPINGECTOMY Bilateral 05/07/2017   Procedure: LAPAROSCOPIC BILATERAL SALPINGECTOMY;  Surgeon: Tilda Burrow, MD;  Location: [facility];  Service: Gynecology;  Laterality: Bilateral;  . UMBILICAL HERNIA REPAIR N/A 05/07/2017   Procedure: HERNIA REPAIR UMBILICAL ADULT;  Surgeon: Tilda Burrow, MD;  Location: [facility];  Service: Gynecology;  Laterality: N/A;    Family History  Problem Relation Age of Onset  . Hypertension Mother   . Heart disease Mother   . Alcohol abuse Mother   . Arthritis Mother   . Asthma Mother   . Depression Mother   . Alcohol abuse Maternal Grandfather   . Hypertension Paternal Grandmother   . Diabetes Paternal Grandmother   . Cancer Paternal Grandfather        lung  . Graves' disease  Sister   . Other Maternal Grandmother        was murdered  . Early death Maternal Grandmother   . Alcohol abuse Father   . Arthritis Father      Social History   Socioeconomic History  . Marital status: Married    Spouse name: Asher Muir  . Number of children: 3  . Years of education: 28  . Highest education level: Not on file  Occupational History  . Occupation: Runner, broadcasting/film/video    Comment: [employer]  . Financial resource strain: Not on file  . Food insecurity:    Worry: Not on file    Inability: Not on file  . Transportation needs:    Medical: Not on file    Non-medical: Not on file  Tobacco Use  . Smoking status:  Former Smoker    Packs/day: 0.25    Years: 5.00    Pack years: 1.25    Types: Cigarettes    Last attempt to quit: 05/03/1999    Years since quitting: 18.4  . Smokeless tobacco: Never Used  Substance and Sexual Activity  . Alcohol use: Yes    Comment: social  . Drug use: No  . Sexual activity: Yes    Birth control/protection: Injection  Lifestyle  . Physical activity:    Days per week: Not on file    Minutes per session: Not on file  . Stress: Not on file  Relationships  . Social connections:    Talks on phone: Not on file    Gets together: Not on file    Attends religious service: Not on file    Active member of club or organization: Not on file    Attends meetings of clubs or organizations: Not on file    Relationship status: Not on file  Other Topics Concern  . Not on file  Social History Narrative   Art teacher   Lives with [place]   Yoga/exercises.    Children, 3 biological.    Current Outpatient Medications on File Prior to Visit  Medication Sig Dispense Refill  . albuterol (PROVENTIL HFA;VENTOLIN HFA) 108 (90 BASE) MCG/ACT inhaler Inhale 2 puffs into the lungs every 6 (six) hours as needed for wheezing or shortness of breath. 1 Inhaler 0  . Bioflavonoid Products (VITAMIN C) CHEW Chew by mouth daily.    . cetirizine (ZYRTEC) 10 MG tablet Take 10 mg by mouth daily.    . Cholecalciferol (VITAMIN D PO) Take by mouth daily.    . Cyanocobalamin (VITAMIN B12 PO) Take by mouth daily.    . diphenhydrAMINE (BENADRYL) 25 MG tablet Take 25 mg by mouth every 6 (six) hours as needed.    Marland Kitchen EPINEPHrine (EPIPEN) 0.3 mg/0.3 mL IJ SOAJ injection Inject 0.3 mLs (0.3 mg total) into the muscle once. 1 Device 0  . hydrochlorothiazide (MICROZIDE) 12.5 MG capsule TAKE 1 CAPSULE(12.5 MG) BY MOUTH DAILY 30 capsule 6  . losartan (COZAAR) 50 MG tablet TAKE 1 TABLET(50 MG) BY MOUTH DAILY 30 tablet 3  . mometasone (ELOCON) 0.1 % cream Apply 1 application topically daily. 45 g 1  . topiramate  (TOPAMAX) 25 MG capsule Take 1 capsule (25 mg total) by mouth daily. 30 capsule 3   No current facility-administered medications on file prior to visit.     Review of Systems  Constitutional: Positive for fatigue. Negative for activity change, appetite change and fever.       Report occasional fatigue.  Eyes: Negative for visual  disturbance.  Respiratory: Negative for cough, chest tightness and shortness of breath.   Cardiovascular: Negative for chest pain, palpitations and leg swelling.  Gastrointestinal: Negative for abdominal pain, nausea and vomiting.  Skin: Negative for rash.  Neurological: Negative for dizziness, syncope and light-headedness.  Hematological: Negative for adenopathy.  Psychiatric/Behavioral: Negative for decreased concentration, dysphoric mood, self-injury, sleep disturbance and suicidal ideas.     Objective:   BP 118/82   Pulse 93   Resp 16   Ht  (1.626 m)   Wt 192 lb (87.1 kg)   SpO2 96%   BMI 32.96 kg/m   Physical Exam Alert and oriented.  No acute distress.  Heart regular rate and rhythm.  Lungs clear to auscultation bilaterally.  Cranial nerves II through XII grossly intact.  No clubbing, cyanosis, or edema in lower extremities bilaterally.  Mood euthymic.  Affect congruent with mood.  Assessment and Plan   1. Essential hypertension Continue blood pressure medication.  Well-controlled. Lifestyle modifications discussed with patient including a diet emphasizing vegetables, fruits, and whole grains. Limiting intake of sodium to less than 2,400 mg per day.  Recommendations discussed include consuming low-fat dairy products, poultry, fish, legumes, non-tropical vegetable oils, and nuts; and limiting intake of sweets, sugar-sweetened beverages, and red meat. Discussed following a plan such as the Dietary Approaches to Stop Hypertension (DASH) diet. Patient to read up on this diet.   - Hemoglobin A1c - COMPLETE METABOLIC PANEL WITH GFR  2. Obesity  (BMI 30.0-34.9) Diet, exercise, and weight loss modifications discussed. Patient understands risk of this medication.  They were discussed at last visit.  Voiced and reiterated again. - phentermine 30 MG capsule; Take 1 capsule (30 mg total) by mouth every morning.  Dispense: 30 capsule; Refill: 0 -Weight loss medication and side effects discussed. Patient was told that she may have a refill of this medication in 1 month.  I will refill this medication without her coming in since she will be coming in in the next 4 to 6 weeks for her complete physical. -Worsening control, decreasing sugar, adequate water intake, and maintaining good physical activity was discussed today 3. Hypercholesteremia We will check labs in anticipation of upcoming physical. - Lipid panel  4. Fatigue, unspecified type Intermittent, sporadic fatigue.  Suspect secondary to lifestyle/busy schedule. - CBC with Differential/Platelet - Vitamin B12 - TSH  5. Vitamin D deficiency History of deficiency.  Check for improvement. - VITAMIN D 25 Hydroxy (Vit-D Deficiency, Fractures) -Office visit was greater than 25 minutes.  Greater than 50% spent counseling and coordinating care. Patient defers testing for HIV, syphilis, or hepatitis.    Return for follow up CPE in 1-5months. Aliene Beams, MD 10/11/2017

## 2017-10-25 ENCOUNTER — Encounter: Payer: Self-pay | Admitting: Family Medicine

## 2017-11-03 ENCOUNTER — Encounter: Payer: Self-pay | Admitting: Adult Health

## 2017-11-04 ENCOUNTER — Other Ambulatory Visit: Payer: Self-pay | Admitting: Adult Health

## 2017-12-05 ENCOUNTER — Ambulatory Visit: Payer: BC Managed Care – PPO | Admitting: Family Medicine

## 2017-12-11 ENCOUNTER — Encounter: Payer: BC Managed Care – PPO | Admitting: Family Medicine

## 2018-02-05 ENCOUNTER — Ambulatory Visit: Payer: BC Managed Care – PPO | Admitting: Adult Health

## 2018-02-05 ENCOUNTER — Encounter: Payer: Self-pay | Admitting: Adult Health

## 2018-02-05 VITALS — BP 133/90 | HR 77 | Ht 64.0 in | Wt 201.2 lb

## 2018-02-05 DIAGNOSIS — B9689 Other specified bacterial agents as the cause of diseases classified elsewhere: Secondary | ICD-10-CM | POA: Diagnosis not present

## 2018-02-05 DIAGNOSIS — B379 Candidiasis, unspecified: Secondary | ICD-10-CM | POA: Diagnosis not present

## 2018-02-05 DIAGNOSIS — N898 Other specified noninflammatory disorders of vagina: Secondary | ICD-10-CM

## 2018-02-05 DIAGNOSIS — N760 Acute vaginitis: Secondary | ICD-10-CM | POA: Diagnosis not present

## 2018-02-05 LAB — POCT WET PREP (WET MOUNT)
Clue Cells Wet Prep Whiff POC: NEGATIVE
WBC, Wet Prep HPF POC: POSITIVE

## 2018-02-05 MED ORDER — FLUCONAZOLE 150 MG PO TABS: ORAL_TABLET | ORAL | 3 refills | Status: DC

## 2018-02-05 MED ORDER — NYSTATIN-TRIAMCINOLONE 100000-0.1 UNIT/GM-% EX CREA: 1.0000 "application " | TOPICAL_CREAM | Freq: Two times a day (BID) | CUTANEOUS | 3 refills | Status: DC

## 2018-02-05 MED ORDER — METRONIDAZOLE 500 MG PO TABS: 500.0000 mg | ORAL_TABLET | Freq: Two times a day (BID) | ORAL | 0 refills | Status: DC

## 2018-02-05 NOTE — Progress Notes (Signed)
  Subjective:     Patient ID: Deanna Friedman, female   DOB: May 26, 1975, 42 y.o.   MRN: [id number]  HPI Lennox LaityJodi is a 42 year old white female in complaining of vaginal discharge and itching esp around period time. She had tubal in December, and had perforation of uterus and could not have ablation.  PCP is Dr Margo AyeHall.   Review of Systems + vaginal discharge and itching, esp with periods Sex drive is better, since off contraception, but periods irregular  Reviewed past medical,surgical, social and family history. Reviewed medications and allergies.     Objective:   Physical Exam BP 133/90 (BP Location: Left Arm, Patient Position: Sitting, Cuff Size: Normal)   Pulse 77   Ht 5\' 4"  (1.626 m)   Wt 201 lb 3.2 oz (91.3 kg)   LMP 01/26/2018 (Exact Date)   BMI 34.54 kg/m   Skin warm and dry.Pelvic: external genitalia is slightly red and swollen, vagina: white discharge without odor,urethra has no lesions or masses noted, cervix:smooth and bulbous, uterus: normal size, shape and contour, non tender, no masses felt, adnexa: no masses or tenderness noted. Bladder is non tender and no masses felt. Wet prep: + for clue cells and yeast and +WBCs. Painted vulva and vagina with gentian violet. And will Rx diflucan with periods and flagyl now.     Assessment:     1. Yeast infection   2. Vaginal itching   3. BV (bacterial vaginosis)   4. Vaginal discharge       Plan:     Meds ordered this encounter  Medications  . fluconazole (DIFLUCAN) 150 MG tablet    Sig: Take 1 tablet before period and 1 during period every month    Dispense:  6 tablet    Refill:  3    Order Specific Question:   Supervising Provider    Answer:   EURE, LUTHER H [[id number]]  . metroNIDAZOLE (FLAGYL) 500 MG tablet    Sig: Take 1 tablet (500 mg total) by mouth 2 (two) times daily.    Dispense:  14 tablet    Refill:  0    Order Specific Question:   Supervising Provider    Answer:   Despina HiddenEURE, LUTHER H [[id number]]  . nystatin-triamcinolone  (MYCOLOG II) cream    Sig: Apply 1 application topically 2 (two) times daily.    Dispense:  30 g    Refill:  3    Order Specific Question:   Supervising Provider    Answer:   Duane LopeEURE, LUTHER H [[id number]]  F/U prn

## 2018-02-21 ENCOUNTER — Encounter (HOSPITAL_COMMUNITY): Payer: Self-pay

## 2018-11-07 ENCOUNTER — Other Ambulatory Visit: Payer: Self-pay | Admitting: Adult Health

## 2018-11-20 ENCOUNTER — Other Ambulatory Visit: Payer: Self-pay | Admitting: Adult Health

## 2019-01-11 ENCOUNTER — Telehealth: Payer: BC Managed Care – PPO | Admitting: Family

## 2019-01-11 DIAGNOSIS — T7840XA Allergy, unspecified, initial encounter: Secondary | ICD-10-CM | POA: Diagnosis not present

## 2019-01-11 MED ORDER — PREDNISONE 10 MG (21) PO TBPK: ORAL_TABLET | ORAL | 0 refills | Status: DC

## 2019-01-11 NOTE — Progress Notes (Signed)
E Visit for Rash  We are sorry that you are not feeling well. Here is how we plan to help!  Based on what you shared with me you may have a virus or an allergic reactio  I recommend you take Benadryl 25 mg - 50 mg every 4 hours to control the symptoms but if they last over 24 hours it is best that you see an office based provider for follow up.    Prednisone 10 mg daily for 6 days (see taper instructions below)  Directions for 6 day taper: Day 1: 2 tablets before breakfast, 1 after both lunch & dinner and 2 at bedtime Day 2: 1 tab before breakfast, 1 after both lunch & dinner and 2 at bedtime Day 3: 1 tab at each meal & 1 at bedtime Day 4: 1 tab at breakfast, 1 at lunch, 1 at bedtime Day 5: 1 tab at breakfast & 1 tab at bedtime Day 6: 1 tab at breakfast   Approximately 5 minutes was spent documenting and reviewing patient's chart.     HOME CARE:   Take cool showers and avoid direct sunlight.  Apply cool compress or wet dressings.  Take a bath in an oatmeal bath.  Sprinkle content of one Aveeno packet under running faucet with comfortably warm water.  Bathe for 15-20 minutes, 1-2 times daily.  Pat dry with a towel. Do not rub the rash.  Use hydrocortisone cream.  Take an antihistamine like Benadryl for widespread rashes that itch.  The adult dose of Benadryl is 25-50 mg by mouth 4 times daily.  Caution:  This type of medication may cause sleepiness.  Do not drink alcohol, drive, or operate dangerous machinery while taking antihistamines.  Do not take these medications if you have prostate enlargement.  Read package instructions thoroughly on all medications that you take.  GET HELP RIGHT AWAY IF:   Symptoms don't go away after treatment.  Severe itching that persists.  If you rash spreads or swells.  If you rash begins to smell.  If it blisters and opens or develops a yellow-brown crust.  You develop a fever.  You have a sore throat.  You become short of  breath.  MAKE SURE YOU:  Understand these instructions. Will watch your condition. Will get help right away if you are not doing well or get worse.  Thank you for choosing an e-visit. Your e-visit answers were reviewed by a board certified advanced clinical practitioner to complete your personal care plan. Depending upon the condition, your plan could have included both over the counter or prescription medications. Please review your pharmacy choice. Be sure that the pharmacy you have chosen is open so that you can pick up your prescription now.  If there is a problem you may message your provider in [place] to have the prescription routed to another pharmacy. Your safety is important to [place]. If you have drug allergies check your prescription carefully.  For the next 24 hours, you can use MyChart to ask questions about today's visit, request a non-urgent call back, or ask for a work or school excuse from your e-visit provider. You will get an email in the next two days asking about your experience. I hope that your e-visit has been valuable and will speed your recovery.

## 2019-02-05 ENCOUNTER — Other Ambulatory Visit (HOSPITAL_COMMUNITY): Payer: Self-pay | Admitting: Internal Medicine

## 2019-02-05 DIAGNOSIS — Z1231 Encounter for screening mammogram for malignant neoplasm of breast: Secondary | ICD-10-CM

## 2019-02-25 ENCOUNTER — Ambulatory Visit: Payer: BC Managed Care – PPO | Admitting: Allergy & Immunology

## 2019-05-20 ENCOUNTER — Ambulatory Visit: Payer: BC Managed Care – PPO | Admitting: Allergy & Immunology

## 2019-07-19 ENCOUNTER — Ambulatory Visit: Payer: BC Managed Care – PPO | Attending: Internal Medicine

## 2019-07-19 DIAGNOSIS — Z23 Encounter for immunization: Secondary | ICD-10-CM | POA: Insufficient documentation

## 2019-07-19 NOTE — Progress Notes (Signed)
   Covid-19 Vaccination Clinic  Name:  Deanna Friedman    MRN: [id number] DOB: [birth year]  07/19/2019  Deanna Friedman was observed post Covid-19 immunization for 30 minutes based on pre-vaccination screening without incidence. She was provided with Vaccine Information Sheet and instruction to access the V-Safe system.   Deanna Friedman was instructed to call 911 with any severe reactions post vaccine: Marland Kitchen Difficulty breathing  . Swelling of your face and throat  . A fast heartbeat  . A bad rash all over your body  . Dizziness and weakness    Immunizations Administered    Name Date Dose VIS Date Route   Moderna COVID-19 Vaccine 07/19/2019  9:26 AM 0.5 mL 04/21/2019 Intramuscular   Manufacturer: Moderna   Lot: [id number]   NDC: 73428-768-11

## 2019-08-22 ENCOUNTER — Ambulatory Visit: Payer: BC Managed Care – PPO | Attending: Internal Medicine

## 2019-08-22 DIAGNOSIS — Z23 Encounter for immunization: Secondary | ICD-10-CM

## 2019-08-22 NOTE — Progress Notes (Signed)
   Covid-19 Vaccination Clinic  Name:  LEIGHTON LUSTER    MRN: [id number] DOB: [birth year]  08/22/2019  Ms. Kneebone was observed post Covid-19 immunization for 30 minutes based on pre-vaccination screening without incident. She was provided with Vaccine Information Sheet and instruction to access the V-Safe system.   Ms. Riebe was instructed to call 911 with any severe reactions post vaccine: Marland Kitchen Difficulty breathing  . Swelling of face and throat  . A fast heartbeat  . A bad rash all over body  . Dizziness and weakness   Immunizations Administered    Name Date Dose VIS Date Route   Moderna COVID-19 Vaccine 08/22/2019  9:46 AM 0.5 mL 04/21/2019 Intramuscular   Manufacturer: Moderna   Lot: [id number]   NDC: 38101-751-02

## 2019-08-24 ENCOUNTER — Other Ambulatory Visit: Payer: Self-pay

## 2019-08-24 ENCOUNTER — Encounter: Payer: Self-pay | Admitting: Adult Health

## 2019-08-24 ENCOUNTER — Ambulatory Visit (INDEPENDENT_AMBULATORY_CARE_PROVIDER_SITE_OTHER): Payer: BC Managed Care – PPO | Admitting: Adult Health

## 2019-08-24 VITALS — BP 129/90 | HR 94 | Ht 64.0 in | Wt 202.5 lb

## 2019-08-24 DIAGNOSIS — R52 Pain, unspecified: Secondary | ICD-10-CM

## 2019-08-24 DIAGNOSIS — N926 Irregular menstruation, unspecified: Secondary | ICD-10-CM | POA: Diagnosis not present

## 2019-08-24 DIAGNOSIS — N951 Menopausal and female climacteric states: Secondary | ICD-10-CM | POA: Diagnosis not present

## 2019-08-24 MED ORDER — NORETHINDRONE ACETATE 5 MG PO TABS: 5.0000 mg | ORAL_TABLET | Freq: Every day | ORAL | 3 refills | Status: DC

## 2019-08-24 MED ORDER — NYSTATIN-TRIAMCINOLONE 100000-0.1 UNIT/GM-% EX OINT: 1.0000 "application " | TOPICAL_OINTMENT | Freq: Two times a day (BID) | CUTANEOUS | 2 refills | Status: DC

## 2019-08-24 NOTE — Patient Instructions (Signed)
Menopause Menopause is the normal time of life when menstrual periods stop completely. It is usually confirmed by 12 months without a menstrual period. The transition to menopause (perimenopause) most often happens between the ages of 45 and 55. During perimenopause, hormone levels change in your body, which can cause symptoms and affect your health. Menopause may increase your risk for:  Loss of bone (osteoporosis), which causes bone breaks (fractures).  Depression.  Hardening and narrowing of the arteries (atherosclerosis), which can cause heart attacks and strokes. What are the causes? This condition is usually caused by a natural change in hormone levels that happens as you get older. The condition may also be caused by surgery to remove both ovaries (bilateral oophorectomy). What increases the risk? This condition is more likely to start at an earlier age if you have certain medical conditions or treatments, including:  A tumor of the pituitary gland in the brain.  A disease that affects the ovaries and hormone production.  Radiation treatment for cancer.  Certain cancer treatments, such as chemotherapy or hormone (anti-estrogen) therapy.  Heavy smoking and excessive alcohol use.  Family history of early menopause. This condition is also more likely to develop earlier in women who are very thin. What are the signs or symptoms? Symptoms of this condition include:  Hot flashes.  Irregular menstrual periods.  Night sweats.  Changes in feelings about sex. This could be a decrease in sex drive or an increased comfort around your sexuality.  Vaginal dryness and thinning of the vaginal walls. This may cause painful intercourse.  Dryness of the skin and development of wrinkles.  Headaches.  Problems sleeping (insomnia).  Mood swings or irritability.  Memory problems.  Weight gain.  Hair growth on the face and chest.  Bladder infections or problems with urinating. How  is this diagnosed? This condition is diagnosed based on your medical history, a physical exam, your age, your menstrual history, and your symptoms. Hormone tests may also be done. How is this treated? In some cases, no treatment is needed. You and your health care provider should make a decision together about whether treatment is necessary. Treatment will be based on your individual condition and preferences. Treatment for this condition focuses on managing symptoms. Treatment may include:  Menopausal hormone therapy (MHT).  Medicines to treat specific symptoms or complications.  Acupuncture.  Vitamin or herbal supplements. Before starting treatment, make sure to let your health care provider know if you have a personal or family history of:  Heart disease.  Breast cancer.  Blood clots.  Diabetes.  Osteoporosis. Follow these instructions at home: Lifestyle  Do not use any products that contain nicotine or tobacco, such as cigarettes and e-cigarettes. If you need help quitting, ask your health care provider.  Get at least 30 minutes of physical activity on 5 or more days each week.  Avoid alcoholic and caffeinated beverages, as well as spicy foods. This may help prevent hot flashes.  Get 7-8 hours of sleep each night.  If you have hot flashes, try: ? Dressing in layers. ? Avoiding things that may trigger hot flashes, such as spicy food, warm places, or stress. ? Taking slow, deep breaths when a hot flash starts. ? Keeping a fan in your home and office.  Find ways to manage stress, such as deep breathing, meditation, or journaling.  Consider going to group therapy with other women who are having menopause symptoms. Ask your health care provider about recommended group therapy meetings. Eating and   drinking  Eat a healthy, balanced diet that contains whole grains, lean protein, low-fat dairy, and plenty of fruits and vegetables.  Your health care provider may recommend  adding more soy to your diet. Foods that contain soy include tofu, tempeh, and soy milk.  Eat plenty of foods that contain calcium and vitamin D for bone health. Items that are rich in calcium include low-fat milk, yogurt, beans, almonds, sardines, broccoli, and kale. Medicines  Take over-the-counter and prescription medicines only as told by your health care provider.  Talk with your health care provider before starting any herbal supplements. If prescribed, take vitamins and supplements as told by your health care provider. These may include: ? Calcium. Women age 51 and older should get 1,200 mg (milligrams) of calcium every day. ? Vitamin D. Women need 600-800 International Units of vitamin D each day. ? Vitamins B12 and B6. Aim for 50 micrograms of B12 and 1.5 mg of B6 each day. General instructions  Keep track of your menstrual periods, including: ? When they occur. ? How heavy they are and how long they last. ? How much time passes between periods.  Keep track of your symptoms, noting when they start, how often you have them, and how long they last.  Use vaginal lubricants or moisturizers to help with vaginal dryness and improve comfort during sex.  Keep all follow-up visits as told by your health care provider. This is important. This includes any group therapy or counseling. Contact a health care provider if:  You are still having menstrual periods after age 55.  You have pain during sex.  You have not had a period for 12 months and you develop vaginal bleeding. Get help right away if:  You have: ? Severe depression. ? Excessive vaginal bleeding. ? Pain when you urinate. ? A fast or irregular heart beat (palpitations). ? Severe headaches. ? Abdomen (abdominal) pain or severe indigestion.  You fell and you think you have a broken bone.  You develop leg or chest pain.  You develop vision problems.  You feel a lump in your breast. Summary  Menopause is the normal  time of life when menstrual periods stop completely. It is usually confirmed by 12 months without a menstrual period.  The transition to menopause (perimenopause) most often happens between the ages of 45 and 55.  Symptoms can be managed through medicines, lifestyle changes, and complementary therapies such as acupuncture.  Eat a balanced diet that is rich in nutrients to promote bone health and heart health and to manage symptoms during menopause. This information is not intended to replace advice given to you by your health care provider. Make sure you discuss any questions you have with your health care provider. Document Revised: 04/19/2017 Document Reviewed: 06/09/2016 Elsevier Patient Education  2020 Elsevier Inc.  

## 2019-08-24 NOTE — Progress Notes (Signed)
  Subjective:     Patient ID: Deanna Friedman, female   DOB: Nov 19, 1975, 44 y.o.   MRN: [id number]  HPI Deanna Friedman is a 44 year old white female,married, G3P3 sp tubal, in complaining of ?peri menopause symptoms, like irregular periods and body aches.She says she had normal pap with PCP.  PCP is Dr Margo Aye.   Review of Systems Irregular periods, will skip period, then when has has odor and bad cramps Vulva feels dry at times Not sleeping well  Has boy aches, esp hands and toes, like arthritis flare Some hot flashes +foggy  Has migraines with aura Reviewed past medical,surgical, social and family history. Reviewed medications and allergies.      Objective:   Physical Exam BP 129/90 (BP Location: Left Arm, Patient Position: Sitting, Cuff Size: Large)   Pulse 94   Ht 5\' 4"  (1.626 m)   Wt 202 lb 8 oz (91.9 kg)   BMI 34.76 kg/m   Skin warm and dry. Lungs: clear to ausculation bilaterally. Cardiovascular: regular rate and rhythm.    Assessment:     1. Perimenopause Review handout on menopause   2. Irregular periods Will Rx aygestin Will rx mytrex to apply to vulva  Meds ordered this encounter  Medications  . norethindrone (AYGESTIN) 5 MG tablet    Sig: Take 1 tablet (5 mg total) by mouth daily.    Dispense:  30 tablet    Refill:  3    Order Specific Question:   Supervising Provider    Answer:   , LUTHER H [[id number]]  . nystatin-triamcinolone ointment (MYCOLOG)    Sig: Apply 1 application topically 2 (two) times daily.    Dispense:  30 g    Refill:  2    Order Specific Question:   Supervising Provider    Answer:   Despina Hidden, LUTHER H [[id number]]    3. Body aches Try OTC Voltaren gel     Plan:     Follow up in in 8 weeks

## 2019-10-20 ENCOUNTER — Ambulatory Visit: Payer: BC Managed Care – PPO | Admitting: Adult Health

## 2019-11-05 ENCOUNTER — Other Ambulatory Visit (HOSPITAL_COMMUNITY): Payer: Self-pay | Admitting: Internal Medicine

## 2019-11-05 ENCOUNTER — Other Ambulatory Visit: Payer: Self-pay | Admitting: Internal Medicine

## 2019-11-05 DIAGNOSIS — R748 Abnormal levels of other serum enzymes: Secondary | ICD-10-CM

## 2019-11-27 ENCOUNTER — Other Ambulatory Visit: Payer: Self-pay | Admitting: Adult Health

## 2020-01-13 ENCOUNTER — Ambulatory Visit (INDEPENDENT_AMBULATORY_CARE_PROVIDER_SITE_OTHER): Payer: BC Managed Care – PPO | Admitting: Internal Medicine

## 2020-01-13 ENCOUNTER — Encounter (INDEPENDENT_AMBULATORY_CARE_PROVIDER_SITE_OTHER): Payer: Self-pay | Admitting: Internal Medicine

## 2020-01-13 ENCOUNTER — Other Ambulatory Visit: Payer: Self-pay

## 2020-01-13 VITALS — BP 130/71 | HR 72 | Ht 63.0 in | Wt 202.8 lb

## 2020-01-13 DIAGNOSIS — N951 Menopausal and female climacteric states: Secondary | ICD-10-CM

## 2020-01-13 DIAGNOSIS — E559 Vitamin D deficiency, unspecified: Secondary | ICD-10-CM

## 2020-01-13 DIAGNOSIS — I10 Essential (primary) hypertension: Secondary | ICD-10-CM

## 2020-01-13 DIAGNOSIS — R5381 Other malaise: Secondary | ICD-10-CM | POA: Diagnosis not present

## 2020-01-13 DIAGNOSIS — M2550 Pain in unspecified joint: Secondary | ICD-10-CM

## 2020-01-13 DIAGNOSIS — R5383 Other fatigue: Secondary | ICD-10-CM

## 2020-01-13 NOTE — Progress Notes (Signed)
Metrics: Intervention Frequency ACO  Documented Smoking Status Yearly  Screened one or more times in 24 months  Cessation Counseling or  Active cessation medication Past 24 months  Past 24 months   Guideline developer: UpToDate (See UpToDate for funding source) Date Released: 2014       Wellness Office Visit  Subjective:  Patient ID: Deanna Friedman, female    DOB: Jan 02, 1976  Age: 44 y.o. 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 HPI  She has many symptoms that she is concerned about.  She seems to have hives all over her body and she is not sure why.  I am not sure she has seen a dermatologist. She feels constantly tired and has body aches most of the time. She also describes joint pains although I do not think she describes joint swelling. She describes irregular cycles.  She says that she is called all the time and has cold intolerance. She does not sleep well most nights. She has been seen by OB/GYN and felt to be in the perimenopause. She is hypertensive and on medications. Past Medical History:  Diagnosis Date  . ADHD (attention deficit hyperactivity disorder)   . Allergy    food, meds, mold, dust  . Anemia   . Asthma   . Blood transfusion without reported diagnosis    childbirth  . Body aches 11/29/2014  . BV (bacterial vaginosis) 10/14/2013  . Complication of anesthesia    'I wake up combative".  . Contraceptive management 03/31/2014  . Decreased sex drive 9/51/8841  . Folliculitis 01/27/2015  . Heart murmur   . HTN (hypertension)   . Hypercholesteremia 10/09/2016  . Migraines   . Multiple allergies   . Obesity 10/14/2013  . Vaginal itching 12/30/2014   Past Surgical History:  Procedure Laterality Date  . ANTERIOR CRUCIATE LIGAMENT REPAIR Left 01/29/2017  . BREAST LUMPECTOMY Right    fibroadenoma  . DILATION AND CURETTAGE OF UTERUS  2011  . DILITATION & CURRETTAGE/HYSTROSCOPY WITH NOVASURE  ABLATION N/A 05/07/2017   Procedure: DILATATION & CURETTAGE/HYSTEROSCOPY;  Surgeon: Tilda Burrow, MD;  Location: [facility];  Service: Gynecology;  Laterality: N/A;  . KNEE ARTHROSCOPY Bilateral   . LAPAROSCOPIC BILATERAL SALPINGECTOMY Bilateral 05/07/2017   Procedure: LAPAROSCOPIC BILATERAL SALPINGECTOMY;  Surgeon: Tilda Burrow, MD;  Location: [facility];  Service: Gynecology;  Laterality: Bilateral;  . UMBILICAL HERNIA REPAIR N/A 05/07/2017   Procedure: HERNIA REPAIR UMBILICAL ADULT;  Surgeon: Tilda Burrow, MD;  Location: [facility];  Service: Gynecology;  Laterality: N/A;     Family History  Problem Relation Age of Onset  . Hypertension Mother   . Heart disease Mother   . Alcohol abuse Mother   . Arthritis Mother   . Asthma Mother   . Depression Mother   . Alcohol abuse Maternal Grandfather   . Hypertension Paternal Grandmother   . Diabetes Paternal Grandmother   . Cancer Paternal Grandfather        lung  . Graves' disease Sister   . Other Maternal Grandmother        was murdered  . Early death Maternal Grandmother   . Alcohol abuse Father   . 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   Lives with Asher Muir   Yoga/exercises.    Children, 3 biological.  Social History   Tobacco Use  . Smoking status: Former Smoker    Packs/day: 0.25    Years: 5.00    Pack years: 1.25    Types: Cigarettes    Quit date: 05/03/1999    Years since quitting: 20.7  . Smokeless tobacco: Never Used  Substance Use Topics  . Alcohol use: Yes    Comment: social    Current Meds  Medication Sig  . albuterol (PROVENTIL HFA;VENTOLIN HFA) 108 (90 BASE) MCG/ACT inhaler Inhale 2 puffs into the lungs every 6 (six) hours as needed for wheezing or shortness of breath.  Marland Kitchen Bioflavonoid Products (VITAMIN C) CHEW Chew by mouth daily.  . cetirizine (ZYRTEC) 10 MG tablet Take 10 mg by  mouth daily.  . Cholecalciferol (VITAMIN D PO) Take by mouth daily.  . diphenhydrAMINE (BENADRYL) 25 MG tablet Take 25 mg by mouth every 6 (six) hours as needed.  Marland Kitchen EPINEPHrine (EPIPEN) 0.3 mg/0.3 mL IJ SOAJ injection Inject 0.3 mLs (0.3 mg total) into the muscle once.  . hydrochlorothiazide (MICROZIDE) 12.5 MG capsule TAKE 1 CAPSULE(12.5 MG) BY MOUTH DAILY  . losartan (COZAAR) 50 MG tablet TAKE 1 TABLET(50 MG) BY MOUTH DAILY  . mometasone (ELOCON) 0.1 % cream Apply 1 application topically daily.  Marland Kitchen nystatin-triamcinolone ointment (MYCOLOG) Apply 1 application topically 2 (two) times daily.  Marland Kitchen OVER THE COUNTER MEDICATION Take 240 mg by mouth in the morning and at bedtime. For migraines.       Depression screen [facility] [place] 2/9 01/13/2020 10/11/2017 09/12/2017 06/06/2017 10/23/2016  Decreased Interest 0 0 0 0 0  Down, Depressed, Hopeless 1 0 0 0 0  PHQ - 2 Score 1 0 0 0 0     Objective:   Today's Vitals: BP 130/71   Pulse 72   Ht 5\' 3"  (1.6 m)   Wt 202 lb 12.8 oz (92 kg)   BMI 35.92 kg/m  Vitals with BMI 01/13/2020 08/24/2019 02/05/2018  Height 5\' 3"  5\' 4"  5\' 4"   Weight 202 lbs 13 oz 202 lbs 8 oz 201 lbs 3 oz  BMI 35.93 34.74 34.52  Systolic 130 129 02/07/2018  Diastolic 71 90 90  Pulse 72 94 77     Physical Exam  She looks systemically well and is obese.  Blood pressure is well controlled.     Assessment   1. Essential hypertension   2. Perimenopause   3. Malaise and fatigue   4. Vitamin D deficiency disease   5. Pain in joint, multiple sites       Tests ordered Orders Placed This Encounter  Procedures  . VITAMIN D 25 Hydroxy (Vit-D Deficiency, Fractures)  . T3, free  . T4  . TSH  . Follicle stimulating hormone  . Luteinizing hormone  . Progesterone  . LUPUS(12) PANEL  . Rheumatoid Arthritis Profile     Plan: 1. Blood work is ordered. 2. She will continue all the medication she is on at the present time.  I wonder if she has some arthritic disease and we will do a lupus  panel and rheumatoid arthritis panel. 3. Further recommendations will depend on all the blood results and I will see her in about a month's time for follow-up to review all these results with her.  In the meantime, if I see something urgent, I will refer her to a specialist as necessary.  I wonder if she actually has thyroid deficiency based on her symptoms.   No orders of the defined types were placed in  this encounter.   Doree Albee, MD

## 2020-01-15 LAB — LUPUS(12) PANEL
Anti Nuclear Antibody (ANA): NEGATIVE
C3 Complement: 169 mg/dL (ref 83–193)
C4 Complement: 34 mg/dL (ref 15–57)
ENA SM Ab Ser-aCnc: <1 AI
Rheumatoid fact SerPl-aCnc: <14 IU/mL (ref <14)
Ribosomal P Protein Ab: <1 AI
SM/RNP: <1 AI
SSA (Ro) (ENA) Antibody, IgG: <1 AI
SSB (La) (ENA) Antibody, IgG: <1 AI
Scleroderma (Scl-70) (ENA) Antibody, IgG: <1 AI
Thyroperoxidase Ab SerPl-aCnc: <1 IU/mL (ref <9)
ds DNA Ab: 1 IU/mL

## 2020-01-15 LAB — T4: T4, Total: 8.6 ug/dL (ref 5.1–11.9)

## 2020-01-15 LAB — TSH: TSH: 1.25 mIU/L

## 2020-01-15 LAB — T3, FREE: T3, Free: 3.2 pg/mL (ref 2.3–4.2)

## 2020-01-15 LAB — PROGESTERONE: Progesterone: <0.5 ng/mL

## 2020-01-15 LAB — FOLLICLE STIMULATING HORMONE: FSH: 5.8 m[IU]/mL

## 2020-01-15 LAB — LUTEINIZING HORMONE: LH: 3.6 m[IU]/mL

## 2020-01-15 LAB — VITAMIN D 25 HYDROXY (VIT D DEFICIENCY, FRACTURES): Vit D, 25-Hydroxy: 39 ng/mL (ref 30–100)

## 2020-02-11 ENCOUNTER — Other Ambulatory Visit: Payer: Self-pay

## 2020-02-11 ENCOUNTER — Ambulatory Visit (INDEPENDENT_AMBULATORY_CARE_PROVIDER_SITE_OTHER): Payer: BC Managed Care – PPO | Admitting: Internal Medicine

## 2020-02-11 ENCOUNTER — Encounter (INDEPENDENT_AMBULATORY_CARE_PROVIDER_SITE_OTHER): Payer: Self-pay | Admitting: Internal Medicine

## 2020-02-11 VITALS — BP 130/74 | HR 76 | Temp 97.5°F | Resp 18 | Ht 63.0 in | Wt 204.2 lb

## 2020-02-11 DIAGNOSIS — N951 Menopausal and female climacteric states: Secondary | ICD-10-CM | POA: Diagnosis not present

## 2020-02-11 DIAGNOSIS — E559 Vitamin D deficiency, unspecified: Secondary | ICD-10-CM | POA: Diagnosis not present

## 2020-02-11 DIAGNOSIS — R5383 Other fatigue: Secondary | ICD-10-CM

## 2020-02-11 DIAGNOSIS — I10 Essential (primary) hypertension: Secondary | ICD-10-CM

## 2020-02-11 DIAGNOSIS — R5381 Other malaise: Secondary | ICD-10-CM | POA: Diagnosis not present

## 2020-02-11 MED ORDER — NP THYROID 30 MG PO TABS: 30.0000 mg | ORAL_TABLET | Freq: Every day | ORAL | 3 refills | Status: DC

## 2020-02-11 NOTE — Progress Notes (Signed)
Metrics: Intervention Frequency ACO  Documented Smoking Status Yearly  Screened one or more times in 24 months  Cessation Counseling or  Active cessation medication Past 24 months  Past 24 months   Guideline developer: UpToDate (See UpToDate for funding source) Date Released: 2014       Wellness Office Visit  Subjective:  Patient ID: Deanna Friedman, female    DOB: 02-26-1976  Age: 44 y.o. MRN: [id number]  CC: This lady comes in to discuss all the results of her blood work and further recommendations. HPI  I see that her vitamin D levels are suboptimal.  Her T3 levels are also suboptimal.  Her progesterone levels are nonexistent and she is in the perimenopause. She continues to feel tired, has muscle pains, seems to have syndrome of chronic fatigue. Past Medical History:  Diagnosis Date  . ADHD (attention deficit hyperactivity disorder)   . Allergy    food, meds, mold, dust  . Anemia   . Asthma   . Blood transfusion without reported diagnosis    childbirth  . Body aches 11/29/2014  . BV (bacterial vaginosis) 10/14/2013  . Complication of anesthesia    'I wake up combative".  . Contraceptive management 03/31/2014  . Decreased sex drive 0/34/7425  . Folliculitis 01/27/2015  . Heart murmur   . HTN (hypertension)   . Hypercholesteremia 10/09/2016  . Migraines   . Multiple allergies   . Obesity 10/14/2013  . Vaginal itching 12/30/2014   Past Surgical History:  Procedure Laterality Date  . ANTERIOR CRUCIATE LIGAMENT REPAIR Left 01/29/2017  . BREAST LUMPECTOMY Right    fibroadenoma  . DILATION AND CURETTAGE OF UTERUS  2011  . DILITATION & CURRETTAGE/HYSTROSCOPY WITH NOVASURE ABLATION N/A 05/07/2017   Procedure: DILATATION & CURETTAGE/HYSTEROSCOPY;  Surgeon: Tilda Burrow, MD;  Location: [facility];  Service: Gynecology;  Laterality: N/A;  . KNEE ARTHROSCOPY Bilateral   . LAPAROSCOPIC BILATERAL SALPINGECTOMY Bilateral 05/07/2017   Procedure: LAPAROSCOPIC BILATERAL SALPINGECTOMY;   Surgeon: Tilda Burrow, MD;  Location: [facility];  Service: Gynecology;  Laterality: Bilateral;  . UMBILICAL HERNIA REPAIR N/A 05/07/2017   Procedure: HERNIA REPAIR UMBILICAL ADULT;  Surgeon: Tilda Burrow, MD;  Location: [facility];  Service: Gynecology;  Laterality: N/A;     Family History  Problem Relation Age of Onset  . Hypertension Mother   . Heart disease Mother   . Alcohol abuse Mother   . Arthritis Mother   . Asthma Mother   . Depression Mother   . Alcohol abuse Maternal Grandfather   . Hypertension Paternal Grandmother   . Diabetes Paternal Grandmother   . Cancer Paternal Grandfather        lung  . Graves' disease Sister   . Other Maternal Grandmother        was murdered  . Early death Maternal Grandmother   . Alcohol abuse Father   . 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   Lives with Asher Muir   Yoga/exercises.    Children, 3 biological.    Social History   Tobacco Use  . Smoking status: Former Smoker    Packs/day: 0.25    Years: 5.00    Pack years: 1.25    Types: Cigarettes    Quit date: 05/03/1999    Years since quitting: 20.7  . Smokeless tobacco: Never Used  Substance Use Topics  . Alcohol  use: Yes    Comment: social    Current Meds  Medication Sig  . albuterol (PROVENTIL HFA;VENTOLIN HFA) 108 (90 BASE) MCG/ACT inhaler Inhale 2 puffs into the lungs every 6 (six) hours as needed for wheezing or shortness of breath.  Marland Kitchen Bioflavonoid Products (VITAMIN C) CHEW Chew by mouth daily.  . cetirizine (ZYRTEC) 10 MG tablet Take 10 mg by mouth daily.  . Cholecalciferol (VITAMIN D PO) Take by mouth daily.  . diphenhydrAMINE (BENADRYL) 25 MG tablet Take 25 mg by mouth every 6 (six) hours as needed.  Marland Kitchen EPINEPHrine (EPIPEN) 0.3 mg/0.3 mL IJ SOAJ injection Inject 0.3 mLs (0.3 mg total) into the muscle once.  . hydrochlorothiazide (MICROZIDE) 12.5  MG capsule TAKE 1 CAPSULE(12.5 MG) BY MOUTH DAILY  . losartan (COZAAR) 50 MG tablet TAKE 1 TABLET(50 MG) BY MOUTH DAILY  . mometasone (ELOCON) 0.1 % cream Apply 1 application topically daily.  Marland Kitchen nystatin-triamcinolone ointment (MYCOLOG) Apply 1 application topically 2 (two) times daily.  Marland Kitchen OVER THE COUNTER MEDICATION Take 240 mg by mouth in the morning and at bedtime. For migraines.      Depression screen [facility] 2/9 01/13/2020 10/11/2017 09/12/2017 06/06/2017 10/23/2016  Decreased Interest 0 0 0 0 0  Down, Depressed, Hopeless 1 0 0 0 0  PHQ - 2 Score 1 0 0 0 0     Objective:   Today's Vitals: BP 130/74 (BP Location: Right Arm, Patient Position: Sitting, Cuff Size: Normal)   Pulse 76   Temp (!) 97.5 F (36.4 C) (Temporal)   Resp 18   Ht 5\' 3"  (1.6 m)   Wt 204 lb 3.2 oz (92.6 kg)   SpO2 98%   BMI 36.17 kg/m  Vitals with BMI 02/11/2020 01/13/2020 08/24/2019  Height 5\' 3"  5\' 3"  5\' 4"   Weight 204 lbs 3 oz 202 lbs 13 oz 202 lbs 8 oz  BMI 36.18 35.93 34.74  Systolic 130 130 10/24/2019  Diastolic 74 71 90  Pulse 76 72 94     Physical Exam  She remains obese.  She has gained 2 pounds since last visit.  No other new physical findings.     Assessment   1. Perimenopause   2. Essential hypertension   3. Malaise and fatigue   4. Vitamin D deficiency disease       Tests ordered No orders of the defined types were placed in this encounter.    Plan: 1. I discussed all the results in detail. 2. I recommended she increase the vitamin D3 to 10,000 units daily. 3. We discussed thyroid hypofunction and symptoms relating to this.  I think she does have symptoms of thyroid deficiency and I have prescribed NP thyroid, off label for her symptoms.  I described possible side effects and how to deal with them. 4. We also discussed her perimenopause state and I mention the possibility of using progesterone.  She would not like to pursue this for the time being but could do later on. 5. We also  discussed nutrition at length and I introduced the concept of intermittent fasting combined with a plant-based diet, focusing on reduction of animal protein and incorporating plenty of water every day together with beans and nuts. 6. I will see her in the next several weeks to see how she is doing.  I spent 45 to 50 minutes with this patient discussing all results in detail and therapeutic options above.   Meds ordered this encounter  Medications  . NP THYROID 30 MG  tablet    Sig: Take 1 tablet (30 mg total) by mouth daily before breakfast.    Dispense:  30 tablet    Refill:  3    Rosan Calbert Normajean Glasgow, MD

## 2020-02-11 NOTE — Patient Instructions (Signed)
[facility] Dietary Recommendations for Weight Loss What to Avoid . Avoid added sugars o Often added sugar can be found in processed foods such as many condiments, dry cereals, cakes, cookies, chips, crisps, crackers, candies, sweetened drinks, etc.  o Read labels and AVOID/DECREASE use of foods with the following in their ingredient list: Sugar, fructose, high fructose corn syrup, sucrose, glucose, maltose, dextrose, molasses, cane sugar, brown sugar, any type of syrup, agave nectar, etc.   . Avoid snacking in between meals . Avoid foods made with flour o If you are going to eat food made with flour, choose those made with whole-grains; and, minimize your consumption as much as is tolerable . Avoid processed foods o These foods are generally stocked in the middle of the grocery store. Focus on shopping on the perimeter of the grocery.  . Avoid Meat  o We recommend following a plant-based diet at [facility]. Thus, we recommend avoiding meat as a general rule. Consider eating beans, legumes, eggs, and/or dairy products for regular protein sources o If you plan on eating meat limit to 4 ounces of meat at a time and choose lean options such as Fish, chicken, turkey. Avoid red meat intake such as pork and/or steak What to Include . Vegetables o GREEN LEAFY VEGETABLES: Kale, spinach, mustard greens, collard greens, cabbage, broccoli, etc. o OTHER: Asparagus, cauliflower, eggplant, carrots, peas, Brussel sprouts, tomatoes, bell peppers, zucchini, beets, cucumbers, etc. . Grains, seeds, and legumes o Beans: kidney beans, black eyed peas, garbanzo beans, black beans, pinto beans, etc. o Whole, unrefined grains: brown rice, barley, bulgur, oatmeal, etc. . Healthy fats  o Avoid highly processed fats such as vegetable oil o Examples of healthy fats: avocado, olives, virgin olive oil, dark chocolate (?72% Cocoa), nuts (peanuts, almonds, walnuts, cashews, pecans, etc.) . None to Low  Intake of Animal Sources of Protein o Meat sources: chicken, turkey, salmon, tuna. Limit to 4 ounces of meat at one time. o Consider limiting dairy sources, but when choosing dairy focus on: PLAIN Greek yogurt, cottage cheese, high-protein milk . Fruit o Choose berries  When to Eat . Intermittent Fasting: o Choosing not to eat for a specific time period, but DO FOCUS ON HYDRATION when fasting o Multiple Techniques: - Time Restricted Eating: eat 3 meals in a day, each meal lasting no more than 60 minutes, no snacks between meals - 16-18 hour fast: fast for 16 to 18 hours up to 7 days a week. Often suggested to start with 2-3 nonconsecutive days per week.  . Remember the time you sleep is counted as fasting.  . Examples of eating schedule: Fast from 7:00pm-11:00am. Eat between 11:00am-7:00pm.  - 24-hour fast: fast for 24 hours up to every other day. Often suggested to start with 1 day per week . Remember the time you sleep is counted as fasting . Examples of eating schedule:  o Eating day: eat 2-3 meals on your eating day. If doing 2 meals, each meal should last no more than 90 minutes. If doing 3 meals, each meal should last no more than 60 minutes. Finish last meal by 7:00pm. o Fasting day: Fast until 7:00pm.  o IF YOU FEEL UNWELL FOR ANY REASON/IN ANY WAY WHEN FASTING, STOP FASTING BY EATING A NUTRITIOUS SNACK OR LIGHT MEAL o ALWAYS FOCUS ON HYDRATION DURING FASTS - Acceptable Hydration sources: water, broths, tea/coffee (black tea/coffee is best but using a small amount of whole-fat dairy products in coffee/tea is acceptable).  -   Poor Hydration Sources: anything with sugar or artificial sweeteners added to it  These recommendations have been developed for patients that are actively receiving medical care from either Dr. Kiran Lapine or Sarah Gray, DNP, NP-C at [facility]. These recommendations are developed for patients with specific medical conditions and are not meant to be  distributed or used by others that are not actively receiving care from either provider listed above at [facility]. It is not appropriate to participate in the above eating plans without proper medical supervision.   Reference: Fung, J. The obesity code. [place]: [place]; 2016.   

## 2020-02-26 ENCOUNTER — Encounter (INDEPENDENT_AMBULATORY_CARE_PROVIDER_SITE_OTHER): Payer: Self-pay | Admitting: Internal Medicine

## 2020-03-23 ENCOUNTER — Ambulatory Visit (INDEPENDENT_AMBULATORY_CARE_PROVIDER_SITE_OTHER): Payer: BC Managed Care – PPO | Admitting: Internal Medicine

## 2020-03-23 ENCOUNTER — Encounter (INDEPENDENT_AMBULATORY_CARE_PROVIDER_SITE_OTHER): Payer: Self-pay | Admitting: Internal Medicine

## 2020-03-23 ENCOUNTER — Other Ambulatory Visit: Payer: Self-pay

## 2020-03-23 VITALS — BP 116/74 | HR 77 | Temp 98.0°F | Ht 63.0 in | Wt 203.2 lb

## 2020-03-23 DIAGNOSIS — E559 Vitamin D deficiency, unspecified: Secondary | ICD-10-CM | POA: Diagnosis not present

## 2020-03-23 DIAGNOSIS — R5381 Other malaise: Secondary | ICD-10-CM

## 2020-03-23 DIAGNOSIS — R5383 Other fatigue: Secondary | ICD-10-CM | POA: Diagnosis not present

## 2020-03-23 DIAGNOSIS — I10 Essential (primary) hypertension: Secondary | ICD-10-CM

## 2020-03-23 MED ORDER — NP THYROID 30 MG PO TABS: 30.0000 mg | ORAL_TABLET | Freq: Two times a day (BID) | ORAL | 3 refills | Status: DC

## 2020-03-23 NOTE — Progress Notes (Signed)
Metrics: Intervention Frequency ACO  Documented Smoking Status Yearly  Screened one or more times in 24 months  Cessation Counseling or  Active cessation medication Past 24 months  Past 24 months   Guideline developer: UpToDate (See UpToDate for funding source) Date Released: 2014       Wellness Office Visit  Subjective:  Patient ID: Deanna Friedman, female    DOB: 1976-02-16  Age: 44 y.o. MRN: [id number]  CC: This lady comes in for follow-up of her symptoms of thyroid hypofunction, vitamin D deficiency, obesity and perimenopausal symptoms. HPI  She has tolerated low-dose desiccated NP thyroid and for the first 2 weeks she felt much improved with more energy.  Then her schedule changed and things have leveled off.  Her nutrition was also doing quite well but when her schedule changed again she started to gain some weight.  Overall she does tend to feel better. Past Medical History:  Diagnosis Date  . ADHD (attention deficit hyperactivity disorder)   . Allergy    food, meds, mold, dust  . Anemia   . Asthma   . Blood transfusion without reported diagnosis    childbirth  . Body aches 11/29/2014  . BV (bacterial vaginosis) 10/14/2013  . Complication of anesthesia    'I wake up combative".  . Contraceptive management 03/31/2014  . Decreased sex drive 5/63/8756  . Folliculitis 01/27/2015  . Heart murmur   . HTN (hypertension)   . Hypercholesteremia 10/09/2016  . Migraines   . Multiple allergies   . Obesity 10/14/2013  . Vaginal itching 12/30/2014   Past Surgical History:  Procedure Laterality Date  . ANTERIOR CRUCIATE LIGAMENT REPAIR Left 01/29/2017  . BREAST LUMPECTOMY Right    fibroadenoma  . DILATION AND CURETTAGE OF UTERUS  2011  . DILITATION & CURRETTAGE/HYSTROSCOPY WITH NOVASURE ABLATION N/A 05/07/2017   Procedure: DILATATION & CURETTAGE/HYSTEROSCOPY;  Surgeon: Tilda Burrow, MD;  Location: [facility];  Service: Gynecology;  Laterality: N/A;  . KNEE ARTHROSCOPY Bilateral   .  LAPAROSCOPIC BILATERAL SALPINGECTOMY Bilateral 05/07/2017   Procedure: LAPAROSCOPIC BILATERAL SALPINGECTOMY;  Surgeon: Tilda Burrow, MD;  Location: [facility];  Service: Gynecology;  Laterality: Bilateral;  . UMBILICAL HERNIA REPAIR N/A 05/07/2017   Procedure: HERNIA REPAIR UMBILICAL ADULT;  Surgeon: Tilda Burrow, MD;  Location: [facility];  Service: Gynecology;  Laterality: N/A;     Family History  Problem Relation Age of Onset  . Hypertension Mother   . Heart disease Mother   . Alcohol abuse Mother   . Arthritis Mother   . Asthma Mother   . Depression Mother   . Alcohol abuse Maternal Grandfather   . Hypertension Paternal Grandmother   . Diabetes Paternal Grandmother   . Cancer Paternal Grandfather        lung  . Graves' disease Sister   . Other Maternal Grandmother        was murdered  . Early death Maternal Grandmother   . Alcohol abuse Father   . 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   Lives with Asher Muir   Yoga/exercises.    Children, 3 biological.    Social History   Tobacco Use  . Smoking status: Former Smoker    Packs/day: 0.25    Years: 5.00    Pack years: 1.25    Types: Cigarettes    Quit date: 05/03/1999  Years since quitting: 20.9  . Smokeless tobacco: Never Used  Substance Use Topics  . Alcohol use: Yes    Comment: social    Current Meds  Medication Sig  . albuterol (PROVENTIL HFA;VENTOLIN HFA) 108 (90 BASE) MCG/ACT inhaler Inhale 2 puffs into the lungs every 6 (six) hours as needed for wheezing or shortness of breath.  Marland Kitchen Bioflavonoid Products (VITAMIN C) CHEW Chew by mouth daily.  . cetirizine (ZYRTEC) 10 MG tablet Take 10 mg by mouth daily.  . Cholecalciferol (VITAMIN D PO) Take by mouth daily.  . diphenhydrAMINE (BENADRYL) 25 MG tablet Take 25 mg by mouth every 6 (six) hours as needed.  Marland Kitchen EPINEPHrine (EPIPEN) 0.3 mg/0.3 mL  IJ SOAJ injection Inject 0.3 mLs (0.3 mg total) into the muscle once.  . hydrochlorothiazide (MICROZIDE) 12.5 MG capsule TAKE 1 CAPSULE(12.5 MG) BY MOUTH DAILY  . losartan (COZAAR) 50 MG tablet TAKE 1 TABLET(50 MG) BY MOUTH DAILY  . mometasone (ELOCON) 0.1 % cream Apply 1 application topically daily.  . NP THYROID 30 MG tablet Take 1 tablet (30 mg total) by mouth daily before breakfast.  . nystatin-triamcinolone ointment (MYCOLOG) Apply 1 application topically 2 (two) times daily.  Marland Kitchen OVER THE COUNTER MEDICATION Take 240 mg by mouth in the morning and at bedtime. For migraines.      Depression screen [facility] 2/9 01/13/2020 10/11/2017 09/12/2017 06/06/2017 10/23/2016  Decreased Interest 0 0 0 0 0  Down, Depressed, Hopeless 1 0 0 0 0  PHQ - 2 Score 1 0 0 0 0     Objective:   Today's Vitals: BP 116/74   Pulse 77   Temp 98 F (36.7 C) (Temporal)   Ht 5\' 3"  (1.6 m)   Wt 203 lb 3.2 oz (92.2 kg)   SpO2 98%   BMI 36.00 kg/m  Vitals with BMI 03/23/2020 02/11/2020 01/13/2020  Height 5\' 3"  5\' 3"  5\' 3"   Weight 203 lbs 3 oz 204 lbs 3 oz 202 lbs 13 oz  BMI 36 36.18 35.93  Systolic 116 130 01/15/2020  Diastolic 74 74 71  Pulse 77 76 72     Physical Exam   She remains obese but has lost 1 pound since last visit.  Blood pressure is in a good range.    Assessment   1. Essential hypertension   2. Malaise and fatigue   3. Vitamin D deficiency disease       Tests ordered No orders of the defined types were placed in this encounter.    Plan: 1. For her symptoms of thyroid hypofunction, we will continue to optimize her NP thyroid, used off label for her symptoms.  I have told her to start taking NP thyroid 30 mg twice a day now and I have sent a new prescription to reflect this. 2. She will continue with vitamin D3 5000 units daily for vitamin D deficiency. 3. She will continue to work on nutrition as before. 4. I will see her in about 4 to 6 weeks time to see how she is doing and we will probably  check some levels then.  Again I mentioned the use of progesterone as she does seem to be having perimenopausal symptoms which is significant.   Meds ordered this encounter  Medications  . NP THYROID 30 MG tablet    Sig: Take 1 tablet (30 mg total) by mouth 2 (two) times daily.    Dispense:  60 tablet    Refill:  3    Sharelle Burditt  Luther Parody, MD

## 2020-04-26 ENCOUNTER — Encounter (INDEPENDENT_AMBULATORY_CARE_PROVIDER_SITE_OTHER): Payer: Self-pay | Admitting: Internal Medicine

## 2020-04-26 ENCOUNTER — Ambulatory Visit (INDEPENDENT_AMBULATORY_CARE_PROVIDER_SITE_OTHER): Payer: BC Managed Care – PPO | Admitting: Internal Medicine

## 2020-04-26 ENCOUNTER — Other Ambulatory Visit: Payer: Self-pay

## 2020-04-26 VITALS — BP 134/70 | HR 78 | Temp 97.3°F | Resp 17 | Ht 64.0 in | Wt 203.0 lb

## 2020-04-26 DIAGNOSIS — R5383 Other fatigue: Secondary | ICD-10-CM

## 2020-04-26 DIAGNOSIS — N951 Menopausal and female climacteric states: Secondary | ICD-10-CM

## 2020-04-26 DIAGNOSIS — I10 Essential (primary) hypertension: Secondary | ICD-10-CM

## 2020-04-26 DIAGNOSIS — E559 Vitamin D deficiency, unspecified: Secondary | ICD-10-CM

## 2020-04-26 DIAGNOSIS — R5381 Other malaise: Secondary | ICD-10-CM

## 2020-04-26 DIAGNOSIS — M2550 Pain in unspecified joint: Secondary | ICD-10-CM

## 2020-04-26 MED ORDER — PROGESTERONE 200 MG PO CAPS: 200.0000 mg | ORAL_CAPSULE | Freq: Every day | ORAL | 3 refills | Status: DC

## 2020-04-26 NOTE — Progress Notes (Signed)
Metrics: Intervention Frequency ACO  Documented Smoking Status Yearly  Screened one or more times in 24 months  Cessation Counseling or  Active cessation medication Past 24 months  Past 24 months   Guideline developer: UpToDate (See UpToDate for funding source) Date Released: 2014       Wellness Office Visit  Subjective:  Patient ID: Deanna Friedman, female    DOB: 1975/12/24  Age: 44 y.o. MRN: [id number]  CC: This lady comes in for follow-up of hypertension, joint pains, malaise and fatigue, vitamin D deficiency and perimenopause. HPI  She has noticed improvement of symptoms and energy with higher dose of NP thyroid at 60 mg daily now.  She still tends to feel cold in her body with cold intolerance. She still has menorrhagia and it seems that her cycles are becoming more irregular now.  She denies any hot flashes or night sweats in particular. She has been taking vitamin D3 supplementation for vitamin D deficiency. Past Medical History:  Diagnosis Date  . ADHD (attention deficit hyperactivity disorder)   . Allergy    food, meds, mold, dust  . Anemia   . Asthma   . Blood transfusion without reported diagnosis    childbirth  . Body aches 11/29/2014  . BV (bacterial vaginosis) 10/14/2013  . Complication of anesthesia    'I wake up combative".  . Contraceptive management 03/31/2014  . Decreased sex drive 0/99/8338  . Folliculitis 01/27/2015  . Heart murmur   . HTN (hypertension)   . Hypercholesteremia 10/09/2016  . Migraines   . Multiple allergies   . Obesity 10/14/2013  . Vaginal itching 12/30/2014   Past Surgical History:  Procedure Laterality Date  . ANTERIOR CRUCIATE LIGAMENT REPAIR Left 01/29/2017  . BREAST LUMPECTOMY Right    fibroadenoma  . DILATION AND CURETTAGE OF UTERUS  2011  . DILITATION & CURRETTAGE/HYSTROSCOPY WITH NOVASURE ABLATION N/A 05/07/2017   Procedure: DILATATION & CURETTAGE/HYSTEROSCOPY;  Surgeon: Tilda Burrow, MD;  Location: [facility];  Service:  Gynecology;  Laterality: N/A;  . KNEE ARTHROSCOPY Bilateral   . LAPAROSCOPIC BILATERAL SALPINGECTOMY Bilateral 05/07/2017   Procedure: LAPAROSCOPIC BILATERAL SALPINGECTOMY;  Surgeon: Tilda Burrow, MD;  Location: [facility];  Service: Gynecology;  Laterality: Bilateral;  . UMBILICAL HERNIA REPAIR N/A 05/07/2017   Procedure: HERNIA REPAIR UMBILICAL ADULT;  Surgeon: Tilda Burrow, MD;  Location: [facility];  Service: Gynecology;  Laterality: N/A;     Family History  Problem Relation Age of Onset  . Hypertension Mother   . Heart disease Mother   . Alcohol abuse Mother   . Arthritis Mother   . Asthma Mother   . Depression Mother   . Alcohol abuse Maternal Grandfather   . Hypertension Paternal Grandmother   . Diabetes Paternal Grandmother   . Cancer Paternal Grandfather        lung  . Graves' disease Sister   . Other Maternal Grandmother        was murdered  . Early death Maternal Grandmother   . Alcohol abuse Father   . 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   Lives with Asher Muir   Yoga/exercises.    Children, 3 biological.    Social History   Tobacco Use  . Smoking status: Former Smoker    Packs/day: 0.25    Years: 5.00    Pack years: 1.25  Types: Cigarettes    Quit date: 05/03/1999    Years since quitting: 20.9  . Smokeless tobacco: Never Used  Substance Use Topics  . Alcohol use: Yes    Comment: social    Current Meds  Medication Sig  . albuterol (PROVENTIL HFA;VENTOLIN HFA) 108 (90 BASE) MCG/ACT inhaler Inhale 2 puffs into the lungs every 6 (six) hours as needed for wheezing or shortness of breath.  Marland Kitchen Bioflavonoid Products (VITAMIN C) CHEW Chew by mouth daily.  . cetirizine (ZYRTEC) 10 MG tablet Take 10 mg by mouth daily.  . Cholecalciferol (VITAMIN D PO) Take by mouth daily.  . diphenhydrAMINE (BENADRYL) 25 MG tablet Take 25 mg by mouth  every 6 (six) hours as needed.  Marland Kitchen EPINEPHrine (EPIPEN) 0.3 mg/0.3 mL IJ SOAJ injection Inject 0.3 mLs (0.3 mg total) into the muscle once.  . hydrochlorothiazide (MICROZIDE) 12.5 MG capsule TAKE 1 CAPSULE(12.5 MG) BY MOUTH DAILY  . losartan (COZAAR) 50 MG tablet TAKE 1 TABLET(50 MG) BY MOUTH DAILY  . mometasone (ELOCON) 0.1 % cream Apply 1 application topically daily.  . NP THYROID 30 MG tablet Take 1 tablet (30 mg total) by mouth 2 (two) times daily. (Patient taking differently: Take 60 mg by mouth daily before breakfast. )  . nystatin-triamcinolone ointment (MYCOLOG) Apply 1 application topically 2 (two) times daily.  Marland Kitchen OVER THE COUNTER MEDICATION Take 240 mg by mouth in the morning and at bedtime. For migraines.  . [DISCONTINUED] NP THYROID 30 MG tablet Take 1 tablet (30 mg total) by mouth daily before breakfast.      Depression screen [facility] 2/9 01/13/2020 10/11/2017 09/12/2017 06/06/2017 10/23/2016  Decreased Interest 0 0 0 0 0  Down, Depressed, Hopeless 1 0 0 0 0  PHQ - 2 Score 1 0 0 0 0     Objective:   Today's Vitals: BP 134/70 (BP Location: Right Arm, Patient Position: Sitting, Cuff Size: Normal)   Pulse 78   Temp (!) 97.3 F (36.3 C)   Resp 17   Ht 5\' 4"  (1.626 m)   Wt 203 lb (92.1 kg)   SpO2 98%   BMI 34.84 kg/m  Vitals with BMI 04/26/2020 03/23/2020 02/11/2020  Height 5\' 4"  5\' 3"  5\' 3"   Weight 203 lbs 203 lbs 3 oz 204 lbs 3 oz  BMI 34.83 36 36.18  Systolic 134 116 02/13/2020  Diastolic 70 74 74  Pulse 78 77 76     Physical Exam  She looks systemically well.  There is no weight loss so far.     Assessment   1. Malaise and fatigue   2. Essential hypertension   3. Pain in joint, multiple sites   4. Vitamin D deficiency disease   5. Perimenopause       Tests ordered No orders of the defined types were placed in this encounter.    Plan: 1. I think we can further optimize her thyroid so I told her to take NP thyroid 30 mg tablets, 3 every morning and this will be a  total dose of NP thyroid 90 mg daily.  She will let me know if she tolerates this and I will send a new prescription of NP thyroid 90 mg, 1 tablet daily. 2. For her perimenopausal symptoms and menorrhagia, I am going to try her on progesterone 200 mg at night and have told her the possible drowsiness that might occur.  If this does not help her menorrhagia, I will try tranexamic acid. 3. Follow-up in about  2 months.   Meds ordered this encounter  Medications  . progesterone (PROMETRIUM) 200 MG capsule    Sig: Take 1 capsule (200 mg total) by mouth daily.    Dispense:  30 capsule    Refill:  3    Varnell Donate Normajean Glasgow, MD

## 2020-05-11 ENCOUNTER — Other Ambulatory Visit (INDEPENDENT_AMBULATORY_CARE_PROVIDER_SITE_OTHER): Payer: Self-pay | Admitting: Internal Medicine

## 2020-05-11 ENCOUNTER — Encounter (INDEPENDENT_AMBULATORY_CARE_PROVIDER_SITE_OTHER): Payer: Self-pay | Admitting: Internal Medicine

## 2020-05-11 MED ORDER — NP THYROID 90 MG PO TABS: 90.0000 mg | ORAL_TABLET | Freq: Every day | ORAL | 3 refills | Status: DC

## 2020-05-30 ENCOUNTER — Encounter (INDEPENDENT_AMBULATORY_CARE_PROVIDER_SITE_OTHER): Payer: Self-pay | Admitting: Internal Medicine

## 2020-05-30 ENCOUNTER — Other Ambulatory Visit (INDEPENDENT_AMBULATORY_CARE_PROVIDER_SITE_OTHER): Payer: Self-pay | Admitting: Internal Medicine

## 2020-05-30 MED ORDER — LOSARTAN POTASSIUM 50 MG PO TABS
50.0000 mg | ORAL_TABLET | Freq: Every day | ORAL | 1 refills | Status: DC
Start: 2020-05-30 — End: 2021-07-18

## 2020-06-03 ENCOUNTER — Encounter (INDEPENDENT_AMBULATORY_CARE_PROVIDER_SITE_OTHER): Payer: Self-pay | Admitting: Internal Medicine

## 2020-06-26 ENCOUNTER — Encounter (INDEPENDENT_AMBULATORY_CARE_PROVIDER_SITE_OTHER): Payer: Self-pay | Admitting: Internal Medicine

## 2020-06-29 ENCOUNTER — Ambulatory Visit (INDEPENDENT_AMBULATORY_CARE_PROVIDER_SITE_OTHER): Payer: BC Managed Care – PPO | Admitting: Internal Medicine

## 2020-06-29 ENCOUNTER — Encounter (INDEPENDENT_AMBULATORY_CARE_PROVIDER_SITE_OTHER): Payer: Self-pay | Admitting: Internal Medicine

## 2020-06-29 ENCOUNTER — Other Ambulatory Visit: Payer: Self-pay

## 2020-06-29 VITALS — BP 120/76 | HR 84 | Resp 16

## 2020-06-29 DIAGNOSIS — R5383 Other fatigue: Secondary | ICD-10-CM

## 2020-06-29 DIAGNOSIS — R5381 Other malaise: Secondary | ICD-10-CM

## 2020-06-29 DIAGNOSIS — N6320 Unspecified lump in the left breast, unspecified quadrant: Secondary | ICD-10-CM | POA: Diagnosis not present

## 2020-06-29 DIAGNOSIS — I10 Essential (primary) hypertension: Secondary | ICD-10-CM | POA: Diagnosis not present

## 2020-06-29 DIAGNOSIS — N951 Menopausal and female climacteric states: Secondary | ICD-10-CM

## 2020-06-29 MED ORDER — NP THYROID 120 MG PO TABS: 120.0000 mg | ORAL_TABLET | Freq: Every day | ORAL | 3 refills | Status: DC

## 2020-06-29 NOTE — Progress Notes (Signed)
Metrics: Intervention Frequency ACO  Documented Smoking Status Yearly  Screened one or more times in 24 months  Cessation Counseling or  Active cessation medication Past 24 months  Past 24 months   Guideline developer: UpToDate (See UpToDate for funding source) Date Released: 2014       Wellness Office Visit  Subjective:  Patient ID: Deanna Friedman, female    DOB: 22-Feb-1976  Age: 45 y.o. MRN: [id number]  CC: This lady comes in for follow-up of fatigue, perimenopausal symptoms, hypertension and recent complaint of a left breast mass. HPI  She has noticed a left breast mass in the last few days.  She denies any significant pain but there is some discomfort.  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 She has tolerated the higher dose of NP thyroid 90 mg daily and does not see much of an effect on how she feels. She does take progesterone at night and it does seem to help her sleep heavier but she is not convinced that it makes her sleep throughout the whole night.  She uses melatonin over-the-counter. Past Medical History:  Diagnosis Date  . ADHD (attention deficit hyperactivity disorder)   . Allergy    food, meds, mold, dust  . Anemia   . Asthma   . Blood transfusion without reported diagnosis    childbirth  . Body aches 11/29/2014  . BV (bacterial vaginosis) 10/14/2013  . Complication of anesthesia    'I wake up combative".  . Contraceptive management 03/31/2014  . Decreased sex drive 07/10/2540  . Folliculitis 01/27/2015  . Heart murmur   . HTN (hypertension)   . Hypercholesteremia 10/09/2016  . Migraines   . Multiple allergies   . Obesity 10/14/2013  . Vaginal itching 12/30/2014   Past Surgical History:  Procedure Laterality Date  . ANTERIOR CRUCIATE LIGAMENT REPAIR Left 01/29/2017  . BREAST LUMPECTOMY Right    fibroadenoma  . DILATION AND CURETTAGE OF UTERUS  2011  . DILITATION &  CURRETTAGE/HYSTROSCOPY WITH NOVASURE ABLATION N/A 05/07/2017   Procedure: DILATATION & CURETTAGE/HYSTEROSCOPY;  Surgeon: Tilda Burrow, MD;  Location: [facility];  Service: Gynecology;  Laterality: N/A;  . KNEE ARTHROSCOPY Bilateral   . LAPAROSCOPIC BILATERAL SALPINGECTOMY Bilateral 05/07/2017   Procedure: LAPAROSCOPIC BILATERAL SALPINGECTOMY;  Surgeon: Tilda Burrow, MD;  Location: [facility];  Service: Gynecology;  Laterality: Bilateral;  . UMBILICAL HERNIA REPAIR N/A 05/07/2017   Procedure: HERNIA REPAIR UMBILICAL ADULT;  Surgeon: Tilda Burrow, MD;  Location: [facility];  Service: Gynecology;  Laterality: N/A;     Family History  Problem Relation Age of Onset  . Hypertension Mother   . Heart disease Mother   . Alcohol abuse Mother   . Arthritis Mother   . Asthma Mother   . Depression Mother   . Alcohol abuse Maternal Grandfather   . Hypertension Paternal Grandmother   . Diabetes Paternal Grandmother   . Cancer Paternal Grandfather        lung  . Graves' disease Sister   . Other Maternal Grandmother        was murdered  . Early death Maternal Grandmother   . Alcohol abuse Father   . 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   Lives with Asher Muir   Yoga/exercises.  Children, 3 biological.    Social History   Tobacco Use  . Smoking status: Former Smoker    Packs/day: 0.25    Years: 5.00    Pack years: 1.25    Types: Cigarettes    Quit date: 05/03/1999    Years since quitting: 21.1  . Smokeless tobacco: Never Used  Substance Use Topics  . Alcohol use: Yes    Comment: social    Current Meds  Medication Sig  . albuterol (PROVENTIL HFA;VENTOLIN HFA) 108 (90 BASE) MCG/ACT inhaler Inhale 2 puffs into the lungs every 6 (six) hours as needed for wheezing or shortness of breath.  Marland Kitchen Bioflavonoid Products (VITAMIN C) CHEW Chew by mouth daily.  . cetirizine  (ZYRTEC) 10 MG tablet Take 10 mg by mouth daily.  . Cholecalciferol (VITAMIN D PO) Take by mouth daily.  . diphenhydrAMINE (BENADRYL) 25 MG tablet Take 25 mg by mouth every 6 (six) hours as needed.  Marland Kitchen EPINEPHrine (EPIPEN) 0.3 mg/0.3 mL IJ SOAJ injection Inject 0.3 mLs (0.3 mg total) into the muscle once.  . hydrochlorothiazide (MICROZIDE) 12.5 MG capsule TAKE 1 CAPSULE(12.5 MG) BY MOUTH DAILY  . losartan (COZAAR) 50 MG tablet Take 1 tablet (50 mg total) by mouth daily.  . mometasone (ELOCON) 0.1 % cream Apply 1 application topically daily.  . NP THYROID 120 MG tablet Take 1 tablet (120 mg total) by mouth daily before breakfast.  . NP THYROID 90 MG tablet Take 1 tablet (90 mg total) by mouth daily.  Marland Kitchen nystatin-triamcinolone ointment (MYCOLOG) Apply 1 application topically 2 (two) times daily.  Marland Kitchen OVER THE COUNTER MEDICATION Take 240 mg by mouth in the morning and at bedtime. For migraines.  . progesterone (PROMETRIUM) 200 MG capsule Take 1 capsule (200 mg total) by mouth daily.       Objective:   Today's Vitals: BP 120/76   Pulse 84   Resp 16   SpO2 99%  Vitals with BMI 06/29/2020 04/26/2020 03/23/2020  Height - 5\' 4"  5\' 3"   Weight - 203 lbs 203 lbs 3 oz  BMI - 34.83 36  Systolic 120 134  Diastolic 76 70 74  Pulse 84 78 77     Physical Exam  On physical examination of her breasts, there are no lumps found in the right breast.  In the left breast there is a 1 cm mobile firm mass in the left upper outer quadrant.  It does not feel hard.  There is no left axillary lymphadenopathy.     Assessment   1. Left breast mass   2. Essential hypertension   3. Malaise and fatigue   4. Perimenopause       Tests ordered Orders Placed This Encounter  Procedures  . MM DIAG BREAST TOMO BILATERAL     Plan: 1. 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 2. In terms of her  malaise and fatigue and symptoms of thyroid hypofunction, I think we can further optimize thyroid and I have sent a new prescription of NP thyroid 120 mg daily which she will start. 3. She will continue with progesterone 200 mg at night.  She can also increase the dose of this to progesterone 400 mg of night. 4. She told me that her daughter, who is a , is going to start working with her on a more regular basis that will motivate  her to exercise. 5. Follow-up in about 2 months to see how she is doing.   Meds ordered this encounter  Medications  . NP THYROID 120 MG tablet    Sig: Take 1 tablet (120 mg total) by mouth daily before breakfast.    Dispense:  30 tablet    Refill:  3    Patricia Fargo Normajean Glasgow, MD

## 2020-06-30 ENCOUNTER — Other Ambulatory Visit (HOSPITAL_COMMUNITY): Payer: Self-pay | Admitting: Internal Medicine

## 2020-06-30 DIAGNOSIS — N63 Unspecified lump in unspecified breast: Secondary | ICD-10-CM

## 2020-07-12 ENCOUNTER — Ambulatory Visit (HOSPITAL_COMMUNITY)
Admission: RE | Admit: 2020-07-12 | Discharge: 2020-07-12 | Disposition: A | Discharge status: Home / Self Care | Payer: BC Managed Care – PPO | Source: Ambulatory Visit | Attending: Internal Medicine | Admitting: Internal Medicine

## 2020-07-12 ENCOUNTER — Other Ambulatory Visit: Payer: Self-pay

## 2020-07-12 DIAGNOSIS — N63 Unspecified lump in unspecified breast: Secondary | ICD-10-CM | POA: Diagnosis present

## 2020-07-12 DIAGNOSIS — N6320 Unspecified lump in the left breast, unspecified quadrant: Secondary | ICD-10-CM | POA: Insufficient documentation

## 2020-07-26 ENCOUNTER — Encounter (HOSPITAL_COMMUNITY): Payer: BC Managed Care – PPO

## 2020-07-26 ENCOUNTER — Other Ambulatory Visit (HOSPITAL_COMMUNITY): Payer: BC Managed Care – PPO

## 2020-07-26 ENCOUNTER — Ambulatory Visit (HOSPITAL_COMMUNITY): Payer: BC Managed Care – PPO

## 2020-08-22 ENCOUNTER — Other Ambulatory Visit: Payer: Self-pay

## 2020-08-22 ENCOUNTER — Ambulatory Visit (INDEPENDENT_AMBULATORY_CARE_PROVIDER_SITE_OTHER): Payer: BC Managed Care – PPO | Admitting: Internal Medicine

## 2020-08-22 ENCOUNTER — Encounter (INDEPENDENT_AMBULATORY_CARE_PROVIDER_SITE_OTHER): Payer: Self-pay | Admitting: Internal Medicine

## 2020-08-22 VITALS — BP 126/76 | HR 96 | Temp 97.7°F | Ht 64.0 in | Wt 211.2 lb

## 2020-08-22 DIAGNOSIS — J302 Other seasonal allergic rhinitis: Secondary | ICD-10-CM | POA: Diagnosis not present

## 2020-08-22 DIAGNOSIS — I10 Essential (primary) hypertension: Secondary | ICD-10-CM

## 2020-08-22 DIAGNOSIS — R5381 Other malaise: Secondary | ICD-10-CM

## 2020-08-22 DIAGNOSIS — R5383 Other fatigue: Secondary | ICD-10-CM

## 2020-08-22 DIAGNOSIS — M2550 Pain in unspecified joint: Secondary | ICD-10-CM | POA: Diagnosis not present

## 2020-08-22 MED ORDER — IBUPROFEN 800 MG PO TABS: 800.0000 mg | ORAL_TABLET | Freq: Three times a day (TID) | ORAL | 0 refills | Status: DC | PRN

## 2020-08-22 MED ORDER — MONTELUKAST SODIUM 10 MG PO TABS: 10.0000 mg | ORAL_TABLET | Freq: Every day | ORAL | 3 refills | Status: AC

## 2020-08-22 NOTE — Progress Notes (Signed)
Metrics: Intervention Frequency ACO  Documented Smoking Status Yearly  Screened one or more times in 24 months  Cessation Counseling or  Active cessation medication Past 24 months  Past 24 months   Guideline developer: UpToDate (See UpToDate for funding source) Date Released: 2014       Wellness Office Visit  Subjective:  Patient ID: Deanna Friedman, female    DOB: 1975/07/24  Age: 45 y.o. MRN: [id number]  CC: This lady comes in for follow-up of hypertension, fatigue, seasonal allergies. HPI  Unfortunately, she felt that she could not tolerate progesterone so she discontinued it.  Apparently it made her have lots of food cravings and made her very drowsy.  Once she stopped taking the progesterone, her symptoms seem to improve.  However, she is getting more migraines which are often triggered by her seasonal allergies that she suffers from also. She wonders whether we can try something for these migrainous headaches. She has seen a neurologist in the past for them. She has tolerated NP thyroid 120 mg daily but she has been only taking this for about a week or so. Exercise is not consistent still. Past Medical History:  Diagnosis Date  . ADHD (attention deficit hyperactivity disorder)   . Allergy    food, meds, mold, dust  . Anemia   . Asthma   . Blood transfusion without reported diagnosis    childbirth  . Body aches 11/29/2014  . BV (bacterial vaginosis) 10/14/2013  . Complication of anesthesia    'I wake up combative".  . Contraceptive management 03/31/2014  . Decreased sex drive 2/70/3500  . Folliculitis 01/27/2015  . Heart murmur   . HTN (hypertension)   . Hypercholesteremia 10/09/2016  . Migraines   . Multiple allergies   . Obesity 10/14/2013  . Vaginal itching 12/30/2014   Past Surgical History:  Procedure Laterality Date  . ANTERIOR CRUCIATE LIGAMENT REPAIR Left 01/29/2017  . BREAST LUMPECTOMY Right    fibroadenoma  . DILATION AND CURETTAGE OF UTERUS  2011  .  DILITATION & CURRETTAGE/HYSTROSCOPY WITH NOVASURE ABLATION N/A 05/07/2017   Procedure: DILATATION & CURETTAGE/HYSTEROSCOPY;  Surgeon: Tilda Burrow, MD;  Location: [facility];  Service: Gynecology;  Laterality: N/A;  . KNEE ARTHROSCOPY Bilateral   . LAPAROSCOPIC BILATERAL SALPINGECTOMY Bilateral 05/07/2017   Procedure: LAPAROSCOPIC BILATERAL SALPINGECTOMY;  Surgeon: Tilda Burrow, MD;  Location: [facility];  Service: Gynecology;  Laterality: Bilateral;  . UMBILICAL HERNIA REPAIR N/A 05/07/2017   Procedure: HERNIA REPAIR UMBILICAL ADULT;  Surgeon: Tilda Burrow, MD;  Location: [facility];  Service: Gynecology;  Laterality: N/A;     Family History  Problem Relation Age of Onset  . Hypertension Mother   . Heart disease Mother   . Alcohol abuse Mother   . Arthritis Mother   . Asthma Mother   . Depression Mother   . Alcohol abuse Maternal Grandfather   . Hypertension Paternal Grandmother   . Diabetes Paternal Grandmother   . Cancer Paternal Grandfather        lung  . Graves' disease Sister   . Other Maternal Grandmother        was murdered  . Early death Maternal Grandmother   . Alcohol abuse Father   . 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   Lives with Asher Muir   Yoga/exercises.    Children, 3 biological.  Social History   Tobacco Use  . Smoking status: Former Smoker    Packs/day: 0.25    Years: 5.00    Pack years: 1.25    Types: Cigarettes    Quit date: 05/03/1999    Years since quitting: 21.3  . Smokeless tobacco: Never Used  Substance Use Topics  . Alcohol use: Yes    Comment: social    Current Meds  Medication Sig  . albuterol (PROVENTIL HFA;VENTOLIN HFA) 108 (90 BASE) MCG/ACT inhaler Inhale 2 puffs into the lungs every 6 (six) hours as needed for wheezing or shortness of breath.  Marland Kitchen Bioflavonoid Products (VITAMIN C) CHEW Chew by mouth daily.   . cetirizine (ZYRTEC) 10 MG tablet Take 10 mg by mouth daily.  . Cholecalciferol (VITAMIN D3) 125 MCG (5000 UT) TABS Take 2 tablets by mouth daily.  . diphenhydrAMINE (BENADRYL) 25 MG tablet Take 25 mg by mouth every 6 (six) hours as needed.  Marland Kitchen EPINEPHrine (EPIPEN) 0.3 mg/0.3 mL IJ SOAJ injection Inject 0.3 mLs (0.3 mg total) into the muscle once.  . hydrochlorothiazide (MICROZIDE) 12.5 MG capsule TAKE 1 CAPSULE(12.5 MG) BY MOUTH DAILY  . ibuprofen (ADVIL) 800 MG tablet Take 1 tablet (800 mg total) by mouth every 8 (eight) hours as needed.  Marland Kitchen losartan (COZAAR) 50 MG tablet Take 1 tablet (50 mg total) by mouth daily.  . mometasone (ELOCON) 0.1 % cream Apply 1 application topically daily.  . montelukast (SINGULAIR) 10 MG tablet Take 1 tablet (10 mg total) by mouth at bedtime.  . NP THYROID 120 MG tablet Take 1 tablet (120 mg total) by mouth daily before breakfast.  . nystatin-triamcinolone ointment (MYCOLOG) Apply 1 application topically 2 (two) times daily.  Marland Kitchen OVER THE COUNTER MEDICATION Take 240 mg by mouth in the morning and at bedtime. For migraines.  . [DISCONTINUED] Cholecalciferol (VITAMIN D PO) Take by mouth daily.     Flowsheet Row Office Visit from 08/22/2020 in [facility]  PHQ-9 Total Score 0      Objective:   Today's Vitals: BP 126/76   Pulse 96   Temp 97.7 F (36.5 C) (Temporal)   Ht 5\' 4"  (1.626 m)   Wt 211 lb 3.2 oz (95.8 kg)   SpO2 98%   BMI 36.25 kg/m  Vitals with BMI 08/22/2020 06/29/2020 04/26/2020  Height 5\' 4"  (No Data) 5\' 4"   Weight 211 lbs 3 oz (No Data) 203 lbs  BMI 36.23 - 34.83  Systolic 126 120 14/11/2019  Diastolic 76 76 70  Pulse 96 84 78     Physical Exam  She remains obese.  Blood pressure is reasonable.     Assessment   1. Essential hypertension   2. Malaise and fatigue   3. Pain in joint, multiple sites   4. Seasonal allergies       Tests ordered No orders of the defined types were placed in this encounter.    Plan: 1. She  will continue with losartan and hydrochlorthiazide for hypertension. 2. As far as her seasonal allergies are concerned, we will try Singulair in addition to the other allergy medicines she normally takes to see if this will help. 3. I have given her a prescription of prescription strength ibuprofen when she does get migrainous headaches to see if this can be aborted quickly.  If this does not help, we may need to refer to neurology again. 4. Follow-up in about 2 months to see how she is doing.   Meds ordered this encounter  Medications  . montelukast (SINGULAIR) 10 MG tablet    Sig: Take 1 tablet (10 mg total) by mouth at bedtime.    Dispense:  30 tablet    Refill:  3  . ibuprofen (ADVIL) 800 MG tablet    Sig: Take 1 tablet (800 mg total) by mouth every 8 (eight) hours as needed.    Dispense:  30 tablet    Refill:  0    Jceon Alverio Normajean Glasgow, MD

## 2020-09-20 ENCOUNTER — Encounter (INDEPENDENT_AMBULATORY_CARE_PROVIDER_SITE_OTHER): Payer: Self-pay | Admitting: Internal Medicine

## 2020-09-21 ENCOUNTER — Other Ambulatory Visit (INDEPENDENT_AMBULATORY_CARE_PROVIDER_SITE_OTHER): Payer: Self-pay | Admitting: Internal Medicine

## 2020-09-21 DIAGNOSIS — M2550 Pain in unspecified joint: Secondary | ICD-10-CM

## 2020-10-19 ENCOUNTER — Ambulatory Visit (INDEPENDENT_AMBULATORY_CARE_PROVIDER_SITE_OTHER): Payer: BC Managed Care – PPO | Admitting: Internal Medicine

## 2020-11-23 DIAGNOSIS — M797 Fibromyalgia: Secondary | ICD-10-CM | POA: Insufficient documentation

## 2021-01-04 LAB — COLOGUARD

## 2021-05-21 DIAGNOSIS — T8859XA Other complications of anesthesia, initial encounter: Secondary | ICD-10-CM

## 2021-05-21 HISTORY — DX: Other complications of anesthesia, initial encounter: T88.59XA

## 2021-07-17 ENCOUNTER — Ambulatory Visit: Payer: BC Managed Care – PPO | Admitting: Adult Health

## 2021-07-18 ENCOUNTER — Ambulatory Visit: Payer: BC Managed Care – PPO | Admitting: Adult Health

## 2021-07-18 ENCOUNTER — Other Ambulatory Visit: Payer: Self-pay

## 2021-07-18 ENCOUNTER — Encounter: Payer: Self-pay | Admitting: Adult Health

## 2021-07-18 VITALS — BP 142/92 | HR 104 | Ht 62.0 in | Wt 194.0 lb

## 2021-07-18 DIAGNOSIS — N816 Rectocele: Secondary | ICD-10-CM | POA: Diagnosis not present

## 2021-07-18 DIAGNOSIS — N921 Excessive and frequent menstruation with irregular cycle: Secondary | ICD-10-CM

## 2021-07-18 DIAGNOSIS — N926 Irregular menstruation, unspecified: Secondary | ICD-10-CM

## 2021-07-18 DIAGNOSIS — N812 Incomplete uterovaginal prolapse: Secondary | ICD-10-CM

## 2021-07-18 NOTE — Progress Notes (Signed)
°  Subjective:     Patient ID: Deanna Friedman, female   DOB: November 14, 1975, 46 y.o.   MRN: [id number] Deanna Friedman is a 46 year old white female,married, G3P3 in complaining of ? Vaginal prolapse, and heavy periods that are irregular. Periods last 5 days and may be heavy 3, will change every 1-2 hours. Tampons not comfortable and diva cup does not work either. Has fibromyalgia flare with periods. Had ablation, that did not work.  Had negative pap 11/23/20 with PCP. PCP is Pablo Lawrence NP.   Review of Systems Denies any pain with sex See HPI For positives    Reviewed past medical,surgical, social and family history. Reviewed medications and allergies.  Objective:   Physical Exam BP (!) 142/92 (BP Location: Right Arm, Patient Position: Sitting, Cuff Size: Normal)    Pulse (!) 104    Ht 5\' 2"  (1.575 m)    Wt 194 lb (88 kg)    LMP 07/03/2021 (Exact Date)    BMI 35.48 kg/m     Skin warm and dry.Pelvic: external genitalia is normal in appearance no lesions, vagina: pink and moist, +mild cystocele,urethra has no lesions or masses noted, cervix is bulbous, has first degree uterine descensus, uterus: normal size, shape and contour, non tender, no masses felt, adnexa: no masses or tenderness noted. Bladder is non tender and no masses felt. On rectal exam has low rectocele.   Upstream - 07/18/21 1401       Pregnancy Intention Screening   Does the patient want to become pregnant in the next year? No    Does the patient's partner want to become pregnant in the next year? No    Would the patient like to discuss contraceptive options today? No      Contraception Wrap Up   Current Method Female Sterilization    End Method Female Sterilization    Contraception Counseling Provided No            Examination chaperoned by Celene Squibb LPN  Assessment:     1. Cystocele with first degree uterine prolapse Discussed pessary and hysterectomy  2. Rectocele  3. Irregular periods  4. Menorrhagia with  irregular cycle SP ablation, did not work    Plan:     Rerun in 2 weeks to discuss surgical options with Dr Nelda Marseille

## 2021-08-02 ENCOUNTER — Encounter: Payer: Self-pay | Admitting: Obstetrics & Gynecology

## 2021-08-02 ENCOUNTER — Ambulatory Visit: Payer: BC Managed Care – PPO | Admitting: Obstetrics & Gynecology

## 2021-08-02 ENCOUNTER — Other Ambulatory Visit: Payer: Self-pay

## 2021-08-02 VITALS — BP 126/75 | HR 98 | Ht 62.0 in | Wt 190.0 lb

## 2021-08-02 DIAGNOSIS — N939 Abnormal uterine and vaginal bleeding, unspecified: Secondary | ICD-10-CM | POA: Diagnosis not present

## 2021-08-02 NOTE — Progress Notes (Signed)
? ?  GYN VISIT ?Patient name: Deanna Friedman MRN [id number]  Date of birth: 04-30-1976 ?Chief Complaint:   ?Heavy menses ? ?History of Present Illness:   ?Deanna Friedman is a 46 y.o. [phone]  female being seen today for the following:  ? ?-AUB/HMB: Menses typically last for about 5 days.  First 3 days, notes heavy bleeding using thick pads with changes every hour.  Nighttime pad wear barely last overnight.  Also notes some irregularity in her cycle sometimes a week early or late.  Occasional intermenstrual bleeding. ? ?Pelvic pain: Notes significant dysmenorrhea since teenager.  Typically last the first few days of her period.  Taking muscle relaxer and heating pad.  Additionally, due to hive and Fibromyalgia symptoms are worse  during her period. ? ?BTL in 2018 and then had return of her periods. ? ?Of note, ablation was attempted by Dr. 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 ? ?Patient's last menstrual period was 07/03/2021 (exact date). ? ?Depression screen [facility] 2/9 08/22/2020 01/13/2020 10/11/2017 09/12/2017 06/06/2017  ?Decreased Interest 0 0 0 0 0  ?Down, Depressed, Hopeless 0 1 0 0 0  ?PHQ - 2 Score 0 1 0 0 0  ?Altered sleeping 0 - - - -  ?Tired, decreased energy 0 - - - -  ?Change in appetite 0 - - - -  ?Feeling bad or failure about yourself  0 - - - -  ?Trouble concentrating 0 - - - -  ?Moving slowly or fidgety/restless 0 - - - -  ?Suicidal thoughts 0 - - - -  ?PHQ-9 Score 0 - - - -  ?Difficult doing work/chores Not difficult at all - - - -  ? ? ? ?Review of Systems:   ?Pertinent items are noted in HPI ?Denies fever/chills, dizziness, headaches, visual disturbances, fatigue, shortness of breath, chest pain, abdominal pain, vomiting, see HPI regarding menses, bowel movements, urination, or intercourse unless otherwise stated above.  ?Pertinent History Reviewed:  ?Reviewed past medical,surgical, social, obstetrical and family history.  ?Reviewed problem list, medications and  allergies. ?Physical Assessment:  ? ?Vitals:  ? 08/02/21 0954  ?BP: 126/75  ?Pulse: 98  ?Weight: 190 lb (86.2 kg)  ?Height: 5\' 2"  (1.575 m)  ?Body mass index is 34.75 kg/m?. ? ?     Physical Examination:  ? General appearance: alert, well appearing, and in no distress ? Psych: mood appropriate, normal affect ? Skin: warm & dry  ? Cardiovascular: normal heart rate noted ? Respiratory: normal respiratory effort, no distress ? Abdomen: soft, non-tender, no rebound, no guarding ? Pelvic: normal external genitalia, vulva, vagina, cervix, uterus and adnexa, Stage 1 uterine prolapse ? Extremities: no edema  ? ?Chaperone:   ? ?Assessment & Plan:  ?1) AUB/HMB ?-reviewed conservative vs surgical options ?-due to prior complication with ablation- pt declined ?-Plan to pelvic [facility] to r/o underlying etiology ?-desires permanent surgical intervention ?-Plan for vaginal hysterectomy- reviewed risk/benefit including but not limited to risk of bleeding, infection and injury to surrounding organs ?-Discussed same day surgery and recovery ?-Questions and concerns were addressed and she desires to proceed ?-Referral created, plan to schedule in May ? ? ?Return for GYN pelvic/abd June next available. ? ? ?[place], DO ?Attending Obstetrician & Gynecologist, Faculty Practice ?Center for [facility], [facility] ? ? ? ?

## 2021-08-17 DIAGNOSIS — M25562 Pain in left knee: Secondary | ICD-10-CM | POA: Insufficient documentation

## 2021-08-21 ENCOUNTER — Encounter: Payer: Self-pay | Admitting: Obstetrics & Gynecology

## 2021-08-23 ENCOUNTER — Other Ambulatory Visit: Payer: BC Managed Care – PPO

## 2021-09-15 ENCOUNTER — Other Ambulatory Visit (HOSPITAL_COMMUNITY): Payer: BC Managed Care – PPO

## 2021-09-19 ENCOUNTER — Ambulatory Visit: Admit: 2021-09-19 | Payer: BC Managed Care – PPO | Admitting: Obstetrics & Gynecology

## 2021-09-19 SURGERY — HYSTERECTOMY, VAGINAL
Anesthesia: General

## 2021-10-03 ENCOUNTER — Encounter: Payer: BC Managed Care – PPO | Admitting: Obstetrics & Gynecology

## 2021-10-26 ENCOUNTER — Encounter: Payer: Self-pay | Admitting: Obstetrics & Gynecology

## 2021-10-26 ENCOUNTER — Ambulatory Visit: Payer: BC Managed Care – PPO | Admitting: Obstetrics & Gynecology

## 2021-10-26 VITALS — BP 130/93 | HR 96 | Ht 62.0 in | Wt 190.0 lb

## 2021-10-26 DIAGNOSIS — N946 Dysmenorrhea, unspecified: Secondary | ICD-10-CM | POA: Diagnosis not present

## 2021-10-26 DIAGNOSIS — N939 Abnormal uterine and vaginal bleeding, unspecified: Secondary | ICD-10-CM | POA: Diagnosis not present

## 2021-10-26 NOTE — Progress Notes (Signed)
GYN VISIT Patient name: Deanna Friedman MRN [id number]  Date of birth: [birth year] Chief Complaint:   Follow-up (Discuss surgery)  History of Present Illness:   Deanna Friedman is a 46 y.o. [phone] female being seen today to review surgical management.  In review, plan was to proceed with [facility], BS in March; however due to issues with her kneed she needed to reschedule.  She presents today to review management.  In review, she has struggled with HMB and pelvic pain..     -AUB/HMB: Menses last for about 5 days.  First 3 days, notes heavy bleeding using thick pads with changes every hour.  Also notes some irregularity in her cycle sometimes a week early or late. Currently menses about q 35days.      Pelvic pain: Notes significant dysmenorrhea since teenager.  Typically last the first few days of her period.  Still using muscle relaxer and heating pad.  Pain exacerbated by her fibromyalgia.   BTL in 2018    Of note, ablation was attempted by Dr. 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  She reports minimal change since her last visit. Prior medication caused significant mood changes and other side effects.  She is not interested in any medication and wishes to discuss surgery at the next available- prefers mid July.      08/22/2020    4:04 PM 01/13/2020    5:00 PM 10/11/2017    8:11 AM 09/12/2017    8:28 AM 06/06/2017    1:51 PM  Depression screen PHQ 2/9  Decreased Interest 0 0 0 0 0  Down, Depressed, Hopeless 0 1 0 0 0  PHQ - 2 Score 0 1 0 0 0  Altered sleeping 0      Tired, decreased energy 0      Change in appetite 0      Feeling bad or failure about yourself  0      Trouble concentrating 0      Moving slowly or fidgety/restless 0      Suicidal thoughts 0      PHQ-9 Score 0      Difficult doing work/chores Not difficult at all         Review of Systems:   Pertinent items are noted in HPI Denies fever/chills, dizziness, headaches, visual  disturbances, fatigue, shortness of breath, chest pain, abdominal pain, vomiting, Denies problems with bowel movements, urination, or intercourse unless otherwise stated above.  Pertinent History Reviewed:  Reviewed past medical,surgical, social, obstetrical and family history.  Reviewed problem list, medications and allergies. Physical Assessment:   Vitals:   10/26/21 1404  BP: (!) 130/93  Pulse: 96  Weight: 190 lb (86.2 kg)  Height: 5\' 2"  (1.575 m)  Body mass index is 34.75 kg/m.       Physical Examination:   General appearance: alert, well appearing, and in no distress  Psych: mood appropriate, normal affect  Skin: warm & dry   Cardiovascular: RRR  Respiratory: CTAB  Abdomen: soft, non-tender, uterus below umbilicus  Pelvic: deferred  Extremities: no edema, no calf tenderness bilaterally  Chaperone: N/A    Assessment & Plan:  1) AUB/HMB, pelvic pain -plan to completed pelvic to r/o underlying etiology -reviewed plan for [facility], BS -discussed risk/benefit including but not limited to risk of bleeding, infection and injury to surrounding organs -same day surgery -reviewed postop expectations -questions and concerns were addressed -pt would prefer to complete ASAP even  if that means alternative physician, will review with Dr. Despina Hidden  Orders Placed This Encounter  Procedures   US PELVIC COMPLETE WITH TRANSVAGINAL     Myna Hidalgo, DO Attending Obstetrician & Gynecologist, Faculty Practice Center for [facility], [facility]

## 2021-11-13 ENCOUNTER — Ambulatory Visit (HOSPITAL_COMMUNITY)
Admission: RE | Admit: 2021-11-13 | Discharge: 2021-11-13 | Disposition: A | Discharge status: Home / Self Care | Payer: BC Managed Care – PPO | Source: Ambulatory Visit | Attending: Obstetrics & Gynecology | Admitting: Obstetrics & Gynecology

## 2021-11-13 DIAGNOSIS — N939 Abnormal uterine and vaginal bleeding, unspecified: Secondary | ICD-10-CM | POA: Diagnosis present

## 2021-11-17 ENCOUNTER — Encounter: Payer: Self-pay | Admitting: Obstetrics & Gynecology

## 2021-11-17 ENCOUNTER — Ambulatory Visit (INDEPENDENT_AMBULATORY_CARE_PROVIDER_SITE_OTHER): Payer: BC Managed Care – PPO | Admitting: Obstetrics & Gynecology

## 2021-11-17 VITALS — BP 132/92 | HR 75 | Ht 62.0 in | Wt 195.0 lb

## 2021-11-17 DIAGNOSIS — N939 Abnormal uterine and vaginal bleeding, unspecified: Secondary | ICD-10-CM | POA: Diagnosis not present

## 2021-11-17 DIAGNOSIS — N814 Uterovaginal prolapse, unspecified: Secondary | ICD-10-CM | POA: Diagnosis not present

## 2021-11-17 DIAGNOSIS — N946 Dysmenorrhea, unspecified: Secondary | ICD-10-CM | POA: Diagnosis not present

## 2021-11-17 NOTE — Progress Notes (Signed)
Chief Complaint  Patient presents with   Pre-op Exam      46 y.o. [id number] No LMP recorded. (Menstrual status: Irregular Periods). The current method of family planning is tubal ligation.  Outpatient Encounter Medications as of 11/17/2021  Medication Sig Note   albuterol (PROVENTIL HFA;VENTOLIN HFA) 108 (90 BASE) MCG/ACT inhaler Inhale 2 puffs into the lungs every 6 (six) hours as needed for wheezing or shortness of breath. 01/13/2020: prn   amLODipine (NORVASC) 5 MG tablet Take 2.5 mg by mouth daily.    Bioflavonoid Products (VITAMIN C) CHEW Chew by mouth daily.    cetirizine (ZYRTEC) 10 MG tablet Take 10 mg by mouth daily.    Cholecalciferol (VITAMIN D3) 125 MCG (5000 UT) TABS Take 2 tablets by mouth daily.    diazepam (VALIUM) 5 MG tablet Take 10 mg by mouth as needed.    diphenhydrAMINE (BENADRYL) 25 MG tablet Take 25 mg by mouth every 6 (six) hours as needed. 01/13/2020: prn   EPINEPHrine (EPIPEN) 0.3 mg/0.3 mL IJ SOAJ injection Inject 0.3 mLs (0.3 mg total) into the muscle once.    hydrochlorothiazide (MICROZIDE) 12.5 MG capsule TAKE 1 CAPSULE(12.5 MG) BY MOUTH DAILY    ibuprofen (ADVIL) 800 MG tablet Take 1 tablet (800 mg total) by mouth every 8 (eight) hours as needed.    losartan (COZAAR) 25 MG tablet Take by mouth.    magnesium 30 MG tablet Take 30 mg by mouth 2 (two) times daily.    methocarbamol (ROBAXIN) 750 MG tablet Take 750 mg by mouth every 8 (eight) hours as needed for muscle spasms.    mometasone (ELOCON) 0.1 % cream Apply 1 application topically daily. 01/13/2020: prn   montelukast (SINGULAIR) 10 MG tablet Take 1 tablet (10 mg total) by mouth at bedtime.    nystatin-triamcinolone ointment (MYCOLOG) Apply 1 application topically 2 (two) times daily. 01/13/2020: Prn   pregabalin (LYRICA) 100 MG capsule Take 100 mg by mouth 3 (three) times daily.    Turmeric (QC TUMERIC COMPLEX PO) Take by mouth.    No facility-administered encounter medications on file as of  11/17/2021.    Subjective Long standing history of bleeding issues  Deanna Friedman is a 46 y.o. [phone] female being seen today to review surgical management.   In review, plan was to proceed with [facility], BS in March; however due to issues with her kneed she needed to reschedule.  She presents today to review management.   In review, she has struggled with HMB and pelvic pain..      -AUB/HMB: Menses last for about 5 days.  First 3 days, notes heavy bleeding using thick pads with changes every hour.  Also notes some irregularity in her cycle sometimes a week early or late. Currently menses about q 35days.      Pelvic pain: Notes significant dysmenorrhea since teenager.  Typically last the first few days of her period.  Still using muscle relaxer and heating pad.  Pain exacerbated by her fibromyalgia.   BTL in 2018    Of note, ablation was attempted by Dr. 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   She reports minimal change since her last visit. Prior medication caused significant mood changes and other side effects.  She is not interested in any medication and wishes to discuss surgery at the next available- prefers mid July. Past Medical History:  Diagnosis Date   ADHD (attention deficit hyperactivity disorder)  Allergy    food, meds, mold, dust   Anemia    Asthma    Blood transfusion without reported diagnosis    childbirth   Body aches 11/29/2014   BV (bacterial vaginosis) 10/14/2013   Complication of anesthesia    'I wake up combative".   Contraceptive management 03/31/2014   Decreased sex drive 99/83/3825   Fibromyalgia    Folliculitis 01/27/2015   Heart murmur    HTN (hypertension)    Hypercholesteremia 10/09/2016   Migraines    Multiple allergies    Obesity 10/14/2013   Raynaud's disease    Vaginal itching 12/30/2014    Past Surgical History:  Procedure Laterality Date   ANTERIOR CRUCIATE LIGAMENT REPAIR Left 01/29/2017    BREAST LUMPECTOMY Right    fibroadenoma   DILATION AND CURETTAGE OF UTERUS  2011   DILITATION & CURRETTAGE/HYSTROSCOPY WITH NOVASURE ABLATION N/A 05/07/2017   Procedure: DILATATION & CURETTAGE/HYSTEROSCOPY;  Surgeon: Tilda Burrow, MD;  Location: [facility];  Service: Gynecology;  Laterality: N/A;   KNEE ARTHROSCOPY Bilateral    LAPAROSCOPIC BILATERAL SALPINGECTOMY Bilateral 05/07/2017   Procedure: LAPAROSCOPIC BILATERAL SALPINGECTOMY;  Surgeon: Tilda Burrow, MD;  Location: [facility];  Service: Gynecology;  Laterality: Bilateral;   UMBILICAL HERNIA REPAIR N/A 05/07/2017   Procedure: HERNIA REPAIR UMBILICAL ADULT;  Surgeon: Tilda Burrow, MD;  Location: [facility];  Service: Gynecology;  Laterality: N/A;    OB History     Gravida  3   Para  3   Term  3   Preterm      AB      Living  3      SAB      IAB      Ectopic      Multiple      Live Births  3           Allergies  Allergen Reactions   Nutritional Supplements Anaphylaxis    Pitted fruit, highly reactive to cherries   Shellfish Allergy Hives   Other Hives    Pitted Fruits   Aloe Hives   Cephalexin Other (See Comments)    Severe headache   Coconut Fatty Acids Hives    Coconut eaten   Dust Mite Extract Hives   Food Hives    Tree nuts   Percodan [Oxycodone-Aspirin] Other (See Comments)    Serious headache    Social History   Socioeconomic History   Marital status: Married    Spouse name: Asher Muir   Number of children: 3   Years of education: 16   Highest education level: Not on file  Occupational History   Occupation: Runner, broadcasting/film/video    Comment: [place]  Tobacco Use   Smoking status: Former    Packs/day: 0.25    Years: 5.00    Total pack years: 1.25    Types: Cigarettes    Quit date: 05/03/1999    Years since quitting: 22.5   Smokeless tobacco: Never  Vaping Use   Vaping Use: Never used  Substance and Sexual Activity   Alcohol use: Yes    Comment: social   Drug use: No   Sexual  activity: Yes    Birth control/protection: Surgical    Comment: tubal  Other Topics Concern   Not on file  Social History Narrative   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   Lives with Asher Muir   Yoga/exercises.    Children, 3 biological.  Social Determinants of Health   Financial Resource Strain: Not on file  Food Insecurity: Not on file  Transportation Needs: Not on file  Physical Activity: Not on file  Stress: Not on file  Social Connections: Not on file    Family History  Problem Relation Age of Onset   Hypertension Mother    Heart disease Mother    Alcohol abuse Mother    Arthritis Mother    Asthma Mother    Depression Mother    Alcohol abuse Maternal Grandfather    Hypertension Paternal Grandmother    Diabetes Paternal Grandmother    Cancer Paternal Grandfather        lung   Graves' disease Sister    Other Maternal Grandmother        was murdered   Early death Maternal Grandmother    Alcohol abuse Father    Arthritis Father     Medications:       Current Outpatient Medications:    albuterol (PROVENTIL HFA;VENTOLIN HFA) 108 (90 BASE) MCG/ACT inhaler, Inhale 2 puffs into the lungs every 6 (six) hours as needed for wheezing or shortness of breath., Disp: 1 Inhaler, Rfl: 0   amLODipine (NORVASC) 5 MG tablet, Take 2.5 mg by mouth daily., Disp: , Rfl:    Bioflavonoid Products (VITAMIN C) CHEW, Chew by mouth daily., Disp: , Rfl:    cetirizine (ZYRTEC) 10 MG tablet, Take 10 mg by mouth daily., Disp: , Rfl:    Cholecalciferol (VITAMIN D3) 125 MCG (5000 UT) TABS, Take 2 tablets by mouth daily., Disp: , Rfl:    diazepam (VALIUM) 5 MG tablet, Take 10 mg by mouth as needed., Disp: , Rfl:    diphenhydrAMINE (BENADRYL) 25 MG tablet, Take 25 mg by mouth every 6 (six) hours as needed., Disp: , Rfl:    EPINEPHrine (EPIPEN) 0.3 mg/0.3 mL IJ SOAJ injection, Inject 0.3 mLs (0.3 mg total) into the muscle once., Disp: 1 Device,  Rfl: 0   hydrochlorothiazide (MICROZIDE) 12.5 MG capsule, TAKE 1 CAPSULE(12.5 MG) BY MOUTH DAILY, Disp: 30 capsule, Rfl: 12   ibuprofen (ADVIL) 800 MG tablet, Take 1 tablet (800 mg total) by mouth every 8 (eight) hours as needed., Disp: 30 tablet, Rfl: 0   losartan (COZAAR) 25 MG tablet, Take by mouth., Disp: , Rfl:    magnesium 30 MG tablet, Take 30 mg by mouth 2 (two) times daily., Disp: , Rfl:    methocarbamol (ROBAXIN) 750 MG tablet, Take 750 mg by mouth every 8 (eight) hours as needed for muscle spasms., Disp: , Rfl:    mometasone (ELOCON) 0.1 % cream, Apply 1 application topically daily., Disp: 45 g, Rfl: 1   montelukast (SINGULAIR) 10 MG tablet, Take 1 tablet (10 mg total) by mouth at bedtime., Disp: 30 tablet, Rfl: 3   nystatin-triamcinolone ointment (MYCOLOG), Apply 1 application topically 2 (two) times daily., Disp: 30 g, Rfl: 2   pregabalin (LYRICA) 100 MG capsule, Take 100 mg by mouth 3 (three) times daily., Disp: , Rfl:    Turmeric (QC TUMERIC COMPLEX PO), Take by mouth., Disp: , Rfl:   Objective Blood pressure (!) 132/92, pulse 75, height 5\' 2"  (1.575 m), weight 195 lb (88.5 kg).  General WDWN female NAD Vulva:  normal appearing vulva with no masses, tenderness or lesions Vagina:  normal mucosa, no discharge Cervix:  Normal no lesions Uterus:  normal size, contour, position, consistency, mobility, non-tender Grade 2 prolapse Adnexa: ovaries:present,  normal adnexa in size, nontender and no masses  Pertinent ROS No burning with urination, frequency or urgency No nausea, vomiting or diarrhea Nor fever chills or other constitutional symptoms   Labs or studies reviewed    Impression + Management Plan: Diagnoses this Encounter::   ICD-10-CM   1. Abnormal uterine bleeding  N93.9     2. Dysmenorrhea  N94.6     3. Uterine prolapse, Grade 2  N81.4         Medications prescribed during  this encounter: No orders of the defined types were placed in this  encounter.   Labs or Scans Ordered during this encounter: No orders of the defined types were placed in this encounter.     Follow up Plan [facility] 12/20/21

## 2021-11-22 ENCOUNTER — Encounter: Payer: Self-pay | Admitting: Obstetrics & Gynecology

## 2021-11-24 ENCOUNTER — Other Ambulatory Visit (HOSPITAL_COMMUNITY): Payer: Self-pay | Admitting: Adult Health Nurse Practitioner

## 2021-11-24 DIAGNOSIS — G43019 Migraine without aura, intractable, without status migrainosus: Secondary | ICD-10-CM | POA: Insufficient documentation

## 2021-11-24 DIAGNOSIS — Z1231 Encounter for screening mammogram for malignant neoplasm of breast: Secondary | ICD-10-CM

## 2021-11-27 ENCOUNTER — Other Ambulatory Visit (HOSPITAL_COMMUNITY): Payer: Self-pay | Admitting: Adult Health Nurse Practitioner

## 2021-11-27 DIAGNOSIS — Z1231 Encounter for screening mammogram for malignant neoplasm of breast: Secondary | ICD-10-CM

## 2021-11-27 DIAGNOSIS — N6019 Diffuse cystic mastopathy of unspecified breast: Secondary | ICD-10-CM

## 2021-11-27 DIAGNOSIS — N6310 Unspecified lump in the right breast, unspecified quadrant: Secondary | ICD-10-CM

## 2021-12-11 ENCOUNTER — Encounter: Payer: Self-pay | Admitting: Obstetrics & Gynecology

## 2021-12-13 ENCOUNTER — Other Ambulatory Visit: Payer: Self-pay | Admitting: Obstetrics & Gynecology

## 2021-12-13 DIAGNOSIS — Z01818 Encounter for other preprocedural examination: Secondary | ICD-10-CM

## 2021-12-15 ENCOUNTER — Encounter (HOSPITAL_COMMUNITY): Payer: BC Managed Care – PPO

## 2021-12-19 ENCOUNTER — Encounter (HOSPITAL_COMMUNITY): Payer: Self-pay

## 2021-12-19 ENCOUNTER — Other Ambulatory Visit (HOSPITAL_COMMUNITY): Payer: BC Managed Care – PPO

## 2021-12-19 ENCOUNTER — Encounter (HOSPITAL_COMMUNITY): Payer: BC Managed Care – PPO

## 2021-12-20 ENCOUNTER — Ambulatory Visit (HOSPITAL_COMMUNITY)
Admission: RE | Admit: 2021-12-20 | Payer: BC Managed Care – PPO | Source: Ambulatory Visit | Admitting: Obstetrics & Gynecology

## 2021-12-20 ENCOUNTER — Encounter (HOSPITAL_COMMUNITY): Admission: RE | Payer: Self-pay | Source: Ambulatory Visit

## 2021-12-20 SURGERY — HYSTERECTOMY, VAGINAL
Anesthesia: General

## 2021-12-23 ENCOUNTER — Encounter: Payer: Self-pay | Admitting: Obstetrics & Gynecology

## 2022-01-05 ENCOUNTER — Encounter: Payer: BC Managed Care – PPO | Admitting: Obstetrics & Gynecology

## 2022-03-26 ENCOUNTER — Encounter: Payer: Self-pay | Admitting: Obstetrics & Gynecology

## 2022-04-20 ENCOUNTER — Encounter: Payer: Self-pay | Admitting: Obstetrics & Gynecology

## 2022-04-26 NOTE — Patient Instructions (Signed)
Deanna Friedman  04/26/2022     @PREFPERIOPPHARMACY @   Your procedure is scheduled on  05/02/2022.   Report to 05/04/2022 at  [phone]  A.M.   Call this number if you have problems the morning of surgery:  [phone]  If you experience any cold or flu symptoms such as cough, fever, chills, shortness of breath, etc. between now and your scheduled surgery, please notify [phone] at the above number.   Remember:  Do not eat after midnight.   You may drink clear liquids until  0700 am on 05/02/2022.          Clear liquids allowed are:                    Water, Juice (No red color; non-citric and without pulp; diabetics please choose diet or no sugar options), Carbonated beverages (diabetics please choose diet or no sugar options), Clear Tea (No creamer, milk, or cream, including half & half and powdered creamer), Black Coffee Only (No creamer, milk or cream, including half & half and powdered creamer), Plain Jell-O Only (No red color; diabetics please choose no sugar options), Clear Sports drink (No red color; diabetics please choose diet or no sugar options), and Plain Popsicles Only (No red color; diabetics please choose no sugar options)         At 0700 am on 05/02/2022 drink your carb drink. You can have nothing else to drink after this.         Use your inhaler before you come and bring your rescue inhaler with you.    Take these medicines the morning of surgery with A SIP OF WATER                                     lyrica.     Do not wear jewelry, make-up or nail polish.  Do not wear lotions, powders, or perfumes, or deodorant.  Do not shave 48 hours prior to surgery.  Men may shave face and neck.  Do not bring valuables to the hospital.  [facility] is not responsible for any belongings or valuables.  Contacts, dentures or bridgework may not be worn into surgery.  Leave your suitcase in the car.  After surgery it may be brought to your room.  For patients admitted to  the hospital, discharge time will be determined by your treatment team.  Patients discharged the day of surgery will not be allowed to drive home and must have someone with them for 24 hours.    Special instructions:   DO NOT smoke tobacco or vape for 24 hours before your procedure.  Please read over the following fact sheets that you were given. Pain Booklet, Coughing and Deep Breathing, Blood Transfusion Information, Surgical Site Infection Prevention, Anesthesia Post-op Instructions, and Care and Recovery After Surgery       Vaginal Hysterectomy, Care After The following information offers guidance on how to care for yourself after your procedure. Your health care provider may also give you more specific instructions. If you have problems or questions, contact your health care provider. What can I expect after the procedure? After the procedure, it is common to have: Pain in the lower abdomen and vagina. Vaginal bleeding and discharge for up to 1 week. You will need to use a sanitary pad after this procedure. Difficulty having a  bowel movement (constipation). Temporary problems emptying the bladder. Tiredness (fatigue). Poor appetite. Less interest in sex. Feelings of sadness or other emotions. If your ovaries were also removed, it is also common to have symptoms of menopause, such as hot flashes, night sweats, and lack of sleep (insomnia). Follow these instructions at home: Medicines  Take over-the-counter and prescription medicines only as told by your health care provider. Ask your health care provider if the medicine prescribed to you: Requires you to avoid driving or using machinery. Can cause constipation. You may need to take these actions to prevent or treat constipation: Drink enough fluid to keep your urine pale yellow. Take over-the-counter or prescription medicines. Eat foods that are high in fiber, such as beans, whole grains, and fresh fruits and vegetables. Limit  foods that are high in fat and processed sugars, such as fried or sweet foods. Activity  Rest as told by your health care provider. Return to your normal activities as told by your health care provider. Ask your health care provider what activities are safe for you Avoid sitting for a long time without moving. Get up to take short walks every 1-2 hours. This is important to improve blood flow and breathing. Ask for help if you feel weak or unsteady. Try to have someone home with you for 1-2 weeks to help you with everyday chores. Do not lift anything that is heavier than 10 lb (4.5 kg), or the limit that you are told, until your health care provider says that it is safe. If you were given a sedative during the procedure, it can affect you for several hours. Do not drive or operate machinery until your health care provider says that it is safe. Lifestyle Do not use any products that contain nicotine or tobacco. These products include cigarettes, chewing tobacco, and vaping devices, such as e-cigarettes. These can delay healing after surgery. If you need help quitting, ask your health care provider. Do not drink alcohol until your health care provider approves. General instructions Do not douche, use tampons, or have sex for at least 6 weeks, or as told by your health care provider. If you struggle with physical or emotional changes after your procedure, speak with your health care provider or a therapist. The stitches inside your vagina will dissolve over time and do not need to be taken out. Do not take baths, swim, or use a hot tub until your health care provider approves. You may only be allowed to take showers for 2-3 weeks Wear compression stockings as told by your health care provider. These stockings help to prevent blood clots and reduce swelling in your legs. Keep all follow-up visits. This is important. Contact a health care provider if: Your pain medicine is not helping. You have a  fever. You have nausea or vomiting that does not go away. You feel dizzy. You have blood, pus, or a bad-smelling discharge from your vagina more than 1 week after the procedure. You continue to have trouble urinating 3-5 days after the procedure. Get help right away if: You have severe pain in your abdomen or back. You faint. You have heavy vaginal bleeding and blood clots, soaking through a sanitary pad in less than 1 hour. You have chest pain or shortness of breath. You have pain, swelling, or redness in your leg. These symptoms may represent a serious problem that is an emergency. Do not wait to see if the symptoms will go away. Get medical help right away. Call your  local emergency services (911 in the [place] Do not drive yourself to the hospital. Summary After the procedure, it is common to have pain, vaginal bleeding, constipation, temporary problems emptying your bladder, and feelings of sadness or other emotions. Take over-the-counter and prescription medicines only as told by your health care provider. Rest as told by your health care provider. Return to your normal activities as told by your health care provider. Contact a health care provider if your pain medicine is not helping, or you have a fever, dizziness, or trouble urinating several days after the procedure. Get help right away if you have severe pain in your abdomen or back, or if you faint, have heavy bleeding, or have chest pain or shortness of breath. This information is not intended to replace advice given to you by your health care provider. Make sure you discuss any questions you have with your health care provider. Document Revised: 07/19/2021 Document Reviewed: 01/08/2020 Elsevier Patient Education  2023 [employer] General Anesthesia, Adult, Care After The following information offers guidance on how to care for yourself after your procedure. Your health care provider may also give you more specific instructions.  If you have problems or questions, contact your health care provider. What can I expect after the procedure? After the procedure, it is common for people to: Have pain or discomfort at the IV site. Have nausea or vomiting. Have a sore throat or hoarseness. Have trouble concentrating. Feel cold or chills. Feel weak, sleepy, or tired (fatigue). Have soreness and body aches. These can affect parts of the body that were not involved in surgery. Follow these instructions at home: For the time period you were told by your health care provider:  Rest. Do not participate in activities where you could fall or become injured. Do not drive or use machinery. Do not drink alcohol. Do not take sleeping pills or medicines that cause drowsiness. Do not make important decisions or sign legal documents. Do not take care of children on your own. General instructions Drink enough fluid to keep your urine pale yellow. If you have sleep apnea, surgery and certain medicines can increase your risk for breathing problems. Follow instructions from your health care provider about wearing your sleep device: Anytime you are sleeping, including during daytime naps. While taking prescription pain medicines, sleeping medicines, or medicines that make you drowsy. Return to your normal activities as told by your health care provider. Ask your health care provider what activities are safe for you. Take over-the-counter and prescription medicines only as told by your health care provider. Do not use any products that contain nicotine or tobacco. These products include cigarettes, chewing tobacco, and vaping devices, such as e-cigarettes. These can delay incision healing after surgery. If you need help quitting, ask your health care provider. Contact a health care provider if: You have nausea or vomiting that does not get better with medicine. You vomit every time you eat or drink. You have pain that does not get better  with medicine. You cannot urinate or have bloody urine. You develop a skin rash. You have a fever. Get help right away if: You have trouble breathing. You have chest pain. You vomit blood. These symptoms may be an emergency. Get help right away. Call 911. Do not wait to see if the symptoms will go away. Do not drive yourself to the hospital. Summary After the procedure, it is common to have a sore throat, hoarseness, nausea, vomiting, or to feel weak, sleepy, or  fatigue. For the time period you were told by your health care provider, do not drive or use machinery. Get help right away if you have difficulty breathing, have chest pain, or vomit blood. These symptoms may be an emergency. This information is not intended to replace advice given to you by your health care provider. Make sure you discuss any questions you have with your health care provider. Document Revised: 08/04/2021 Document Reviewed: 08/04/2021 Elsevier Patient Education  2023 [employer] Inc. How to Use Chlorhexidine Before Surgery Chlorhexidine gluconate (CHG) is a germ-killing (antiseptic) solution that is used to clean the skin. It can get rid of the bacteria that normally live on the skin and can keep them away for about 24 hours. To clean your skin with CHG, you may be given: A CHG solution to use in the shower or as part of a sponge bath. A prepackaged cloth that contains CHG. Cleaning your skin with CHG may help lower the risk for infection: While you are staying in the intensive care unit of the hospital. If you have a vascular access, such as a central line, to provide short-term or long-term access to your veins. If you have a catheter to drain urine from your bladder. If you are on a ventilator. A ventilator is a machine that helps you breathe by moving air in and out of your lungs. After surgery. What are the risks? Risks of using CHG include: A skin reaction. Hearing loss, if CHG gets in your ears and you  have a perforated eardrum. Eye injury, if CHG gets in your eyes and is not rinsed out. The CHG product catching fire. Make sure that you avoid smoking and flames after applying CHG to your skin. Do not use CHG: If you have a chlorhexidine allergy or have previously reacted to chlorhexidine. On babies younger than 70 months of age. How to use CHG solution Use CHG only as told by your health care provider, and follow the instructions on the label. Use the full amount of CHG as directed. Usually, this is one bottle. During a shower Follow these steps when using CHG solution during a shower (unless your health care provider gives you different instructions): Start the shower. Use your normal soap and shampoo to wash your face and hair. Turn off the shower or move out of the shower stream. Pour the CHG onto a clean washcloth. Do not use any type of brush or rough-edged sponge. Starting at your neck, lather your body down to your toes. Make sure you follow these instructions: If you will be having surgery, pay special attention to the part of your body where you will be having surgery. Scrub this area for at least 1 minute. Do not use CHG on your head or face. If the solution gets into your ears or eyes, rinse them well with water. Avoid your genital area. Avoid any areas of skin that have broken skin, cuts, or scrapes. Scrub your back and under your arms. Make sure to wash skin folds. Let the lather sit on your skin for 1-2 minutes or as long as told by your health care provider. Thoroughly rinse your entire body in the shower. Make sure that all body creases and crevices are rinsed well. Dry off with a clean towel. Do not put any substances on your body afterward--such as powder, lotion, or perfume--unless you are told to do so by your health care provider. Only use lotions that are recommended by the manufacturer. Put on clean  clothes or pajamas. If it is the night before your surgery, sleep in  clean sheets.  During a sponge bath Follow these steps when using CHG solution during a sponge bath (unless your health care provider gives you different instructions): Use your normal soap and shampoo to wash your face and hair. Pour the CHG onto a clean washcloth. Starting at your neck, lather your body down to your toes. Make sure you follow these instructions: If you will be having surgery, pay special attention to the part of your body where you will be having surgery. Scrub this area for at least 1 minute. Do not use CHG on your head or face. If the solution gets into your ears or eyes, rinse them well with water. Avoid your genital area. Avoid any areas of skin that have broken skin, cuts, or scrapes. Scrub your back and under your arms. Make sure to wash skin folds. Let the lather sit on your skin for 1-2 minutes or as long as told by your health care provider. Using a different clean, wet washcloth, thoroughly rinse your entire body. Make sure that all body creases and crevices are rinsed well. Dry off with a clean towel. Do not put any substances on your body afterward--such as powder, lotion, or perfume--unless you are told to do so by your health care provider. Only use lotions that are recommended by the manufacturer. Put on clean clothes or pajamas. If it is the night before your surgery, sleep in clean sheets. How to use CHG prepackaged cloths Only use CHG cloths as told by your health care provider, and follow the instructions on the label. Use the CHG cloth on clean, dry skin. Do not use the CHG cloth on your head or face unless your health care provider tells you to. When washing with the CHG cloth: Avoid your genital area. Avoid any areas of skin that have broken skin, cuts, or scrapes. Before surgery Follow these steps when using a CHG cloth to clean before surgery (unless your health care provider gives you different instructions): Using the CHG cloth, vigorously scrub  the part of your body where you will be having surgery. Scrub using a back-and-forth motion for 3 minutes. The area on your body should be completely wet with CHG when you are done scrubbing. Do not rinse. Discard the cloth and let the area air-dry. Do not put any substances on the area afterward, such as powder, lotion, or perfume. Put on clean clothes or pajamas. If it is the night before your surgery, sleep in clean sheets.  For general bathing Follow these steps when using CHG cloths for general bathing (unless your health care provider gives you different instructions). Use a separate CHG cloth for each area of your body. Make sure you wash between any folds of skin and between your fingers and toes. Wash your body in the following order, switching to a new cloth after each step: The front of your neck, shoulders, and chest. Both of your arms, under your arms, and your hands. Your stomach and groin area, avoiding the genitals. Your right leg and foot. Your left leg and foot. The back of your neck, your back, and your buttocks. Do not rinse. Discard the cloth and let the area air-dry. Do not put any substances on your body afterward--such as powder, lotion, or perfume--unless you are told to do so by your health care provider. Only use lotions that are recommended by the manufacturer. Put on clean clothes or  pajamas. Contact a health care provider if: Your skin gets irritated after scrubbing. You have questions about using your solution or cloth. You swallow any chlorhexidine. Call your local poison control center [phone] in the [place] Get help right away if: Your eyes itch badly, or they become very red or swollen. Your skin itches badly and is red or swollen. Your hearing changes. You have trouble seeing. You have swelling or tingling in your mouth or throat. You have trouble breathing. These symptoms may represent a serious problem that is an emergency. Do not wait to see if  the symptoms will go away. Get medical help right away. Call your local emergency services (911 in the [place] Do not drive yourself to the hospital. Summary Chlorhexidine gluconate (CHG) is a germ-killing (antiseptic) solution that is used to clean the skin. Cleaning your skin with CHG may help to lower your risk for infection. You may be given CHG to use for bathing. It may be in a bottle or in a prepackaged cloth to use on your skin. Carefully follow your health care provider's instructions and the instructions on the product label. Do not use CHG if you have a chlorhexidine allergy. Contact your health care provider if your skin gets irritated after scrubbing. This information is not intended to replace advice given to you by your health care provider. Make sure you discuss any questions you have with your health care provider. Document Revised: 09/04/2021 Document Reviewed: 07/18/2020 Elsevier Patient Education  2023 [employer]

## 2022-04-29 ENCOUNTER — Other Ambulatory Visit: Payer: Self-pay | Admitting: Obstetrics & Gynecology

## 2022-04-29 DIAGNOSIS — Z01818 Encounter for other preprocedural examination: Secondary | ICD-10-CM

## 2022-04-29 MED ORDER — KETOROLAC TROMETHAMINE 30 MG/ML IJ SOLN: 30.0000 mg | Freq: Once | INTRAMUSCULAR | Status: DC

## 2022-04-30 ENCOUNTER — Encounter (HOSPITAL_COMMUNITY)
Admission: RE | Admit: 2022-04-30 | Discharge: 2022-04-30 | Disposition: A | Discharge status: Home / Self Care | Payer: BC Managed Care – PPO | Source: Ambulatory Visit | Attending: Obstetrics & Gynecology | Admitting: Obstetrics & Gynecology

## 2022-04-30 ENCOUNTER — Encounter (HOSPITAL_COMMUNITY): Payer: Self-pay

## 2022-04-30 VITALS — BP 128/89 | HR 74 | Temp 97.5°F | Resp 18 | Ht 62.0 in | Wt 195.1 lb

## 2022-04-30 DIAGNOSIS — N888 Other specified noninflammatory disorders of cervix uteri: Secondary | ICD-10-CM | POA: Diagnosis not present

## 2022-04-30 DIAGNOSIS — N8003 Adenomyosis of the uterus: Secondary | ICD-10-CM | POA: Diagnosis not present

## 2022-04-30 DIAGNOSIS — Z87891 Personal history of nicotine dependence: Secondary | ICD-10-CM | POA: Diagnosis not present

## 2022-04-30 DIAGNOSIS — Z01818 Encounter for other preprocedural examination: Secondary | ICD-10-CM | POA: Insufficient documentation

## 2022-04-30 DIAGNOSIS — N920 Excessive and frequent menstruation with regular cycle: Secondary | ICD-10-CM | POA: Diagnosis not present

## 2022-04-30 DIAGNOSIS — N941 Unspecified dyspareunia: Secondary | ICD-10-CM | POA: Diagnosis not present

## 2022-04-30 DIAGNOSIS — N814 Uterovaginal prolapse, unspecified: Secondary | ICD-10-CM | POA: Diagnosis not present

## 2022-04-30 DIAGNOSIS — N921 Excessive and frequent menstruation with irregular cycle: Secondary | ICD-10-CM | POA: Insufficient documentation

## 2022-04-30 DIAGNOSIS — N946 Dysmenorrhea, unspecified: Secondary | ICD-10-CM | POA: Diagnosis present

## 2022-04-30 DIAGNOSIS — I10 Essential (primary) hypertension: Secondary | ICD-10-CM | POA: Diagnosis not present

## 2022-04-30 LAB — TYPE AND SCREEN
ABO/RH(D): A POS
Antibody Screen: NEGATIVE

## 2022-04-30 LAB — COMPREHENSIVE METABOLIC PANEL
ALT: 39 U/L (ref 0–44)
AST: 28 U/L (ref 15–41)
Albumin: 3.9 g/dL (ref 3.5–5.0)
Alkaline Phosphatase: 57 U/L (ref 38–126)
Anion gap: 6 (ref 5–15)
BUN: 15 mg/dL (ref 6–20)
CO2: 29 mmol/L (ref 22–32)
Calcium: 9 mg/dL (ref 8.9–10.3)
Chloride: 103 mmol/L (ref 98–111)
Creatinine, Ser: 0.62 mg/dL (ref 0.44–1.00)
GFR, Estimated: >60 mL/min (ref >60)
Glucose, Bld: 95 mg/dL (ref 70–99)
Potassium: 3 mmol/L — ABNORMAL LOW (ref 3.5–5.1)
Sodium: 138 mmol/L (ref 135–145)
Total Bilirubin: 0.6 mg/dL (ref 0.3–1.2)
Total Protein: 7.4 g/dL (ref 6.5–8.1)

## 2022-04-30 LAB — URINALYSIS, ROUTINE W REFLEX MICROSCOPIC
Bilirubin Urine: NEGATIVE
Glucose, UA: NEGATIVE mg/dL
Ketones, ur: NEGATIVE mg/dL
Nitrite: NEGATIVE
Protein, ur: NEGATIVE mg/dL
Specific Gravity, Urine: 1.008 (ref 1.005–1.030)
pH: 7 (ref 5.0–8.0)

## 2022-04-30 LAB — CBC
HCT: 44 % (ref 36.0–46.0)
Hemoglobin: 14.8 g/dL (ref 12.0–15.0)
MCH: 32.3 pg (ref 26.0–34.0)
MCHC: 33.6 g/dL (ref 30.0–36.0)
MCV: 96.1 fL (ref 80.0–100.0)
Platelets: 229 10*3/uL (ref 150–400)
RBC: 4.58 MIL/uL (ref 3.87–5.11)
RDW: 13.4 % (ref 11.5–15.5)
WBC: 4.8 10*3/uL (ref 4.0–10.5)
nRBC: 0 % (ref 0.0–0.2)

## 2022-04-30 LAB — POCT PREGNANCY, URINE: Preg Test, Ur: NEGATIVE

## 2022-04-30 LAB — RAPID HIV SCREEN (HIV 1/2 AB+AG)
HIV 1/2 Antibodies: NONREACTIVE
HIV-1 P24 Antigen - HIV24: NONREACTIVE

## 2022-05-01 LAB — RPR: RPR Ser Ql: NONREACTIVE

## 2022-05-02 ENCOUNTER — Encounter (HOSPITAL_COMMUNITY)
Admission: RE | Disposition: A | Discharge status: Home / Self Care | Payer: Self-pay | Source: Home / Self Care | Attending: Obstetrics & Gynecology

## 2022-05-02 ENCOUNTER — Ambulatory Visit (HOSPITAL_COMMUNITY)
Admission: RE | Admit: 2022-05-02 | Discharge: 2022-05-02 | Disposition: A | Discharge status: Home / Self Care | Payer: BC Managed Care – PPO | Attending: Obstetrics & Gynecology | Admitting: Obstetrics & Gynecology

## 2022-05-02 ENCOUNTER — Ambulatory Visit (HOSPITAL_COMMUNITY): Payer: BC Managed Care – PPO | Admitting: Certified Registered Nurse Anesthetist

## 2022-05-02 ENCOUNTER — Encounter (HOSPITAL_COMMUNITY): Payer: Self-pay | Admitting: Obstetrics & Gynecology

## 2022-05-02 ENCOUNTER — Other Ambulatory Visit: Payer: Self-pay | Admitting: Obstetrics & Gynecology

## 2022-05-02 DIAGNOSIS — N888 Other specified noninflammatory disorders of cervix uteri: Secondary | ICD-10-CM

## 2022-05-02 DIAGNOSIS — N8003 Adenomyosis of the uterus: Secondary | ICD-10-CM

## 2022-05-02 DIAGNOSIS — N941 Unspecified dyspareunia: Secondary | ICD-10-CM | POA: Diagnosis not present

## 2022-05-02 DIAGNOSIS — N939 Abnormal uterine and vaginal bleeding, unspecified: Secondary | ICD-10-CM | POA: Diagnosis not present

## 2022-05-02 DIAGNOSIS — N814 Uterovaginal prolapse, unspecified: Secondary | ICD-10-CM | POA: Diagnosis not present

## 2022-05-02 DIAGNOSIS — N920 Excessive and frequent menstruation with regular cycle: Secondary | ICD-10-CM

## 2022-05-02 DIAGNOSIS — I10 Essential (primary) hypertension: Secondary | ICD-10-CM | POA: Insufficient documentation

## 2022-05-02 DIAGNOSIS — Z87891 Personal history of nicotine dependence: Secondary | ICD-10-CM | POA: Insufficient documentation

## 2022-05-02 DIAGNOSIS — N946 Dysmenorrhea, unspecified: Secondary | ICD-10-CM | POA: Diagnosis not present

## 2022-05-02 HISTORY — PX: VAGINAL HYSTERECTOMY: SHX2639

## 2022-05-02 SURGERY — HYSTERECTOMY, VAGINAL
Anesthesia: General | Site: Vagina

## 2022-05-02 MED ORDER — HYDROMORPHONE HCL 1 MG/ML IJ SOLN
0.2500 mg | INTRAMUSCULAR | Status: DC | PRN
  Administered 2022-05-02: 0.5 mg via INTRAVENOUS
  Filled 2022-05-02: qty 0.5

## 2022-05-02 MED ORDER — ONDANSETRON HCL 4 MG/2ML IJ SOLN: 4.0000 mg | Freq: Once | INTRAMUSCULAR | Status: DC | PRN

## 2022-05-02 MED ORDER — STERILE WATER FOR IRRIGATION IR SOLN
Status: AC | PRN
  Administered 2022-05-02: 1000 mL

## 2022-05-02 MED ORDER — PROPOFOL 10 MG/ML IV BOLUS
INTRAVENOUS | Status: AC
  Filled 2022-05-02: qty 20

## 2022-05-02 MED ORDER — CHLORHEXIDINE GLUCONATE 0.12 % MT SOLN
OROMUCOSAL | Status: AC
  Filled 2022-05-02: qty 15

## 2022-05-02 MED ORDER — CIPROFLOXACIN HCL 500 MG PO TABS: 500.0000 mg | ORAL_TABLET | Freq: Two times a day (BID) | ORAL | 0 refills | Status: DC

## 2022-05-02 MED ORDER — NALOXONE HCL 0.4 MG/ML IJ SOLN
INTRAMUSCULAR | Status: DC | PRN
  Administered 2022-05-02: .2 mg via INTRAVENOUS

## 2022-05-02 MED ORDER — DEXMEDETOMIDINE HCL IN NACL 80 MCG/20ML IV SOLN
INTRAVENOUS | Status: AC
  Filled 2022-05-02: qty 20

## 2022-05-02 MED ORDER — ROCURONIUM BROMIDE 10 MG/ML (PF) SYRINGE
PREFILLED_SYRINGE | INTRAVENOUS | Status: AC
  Filled 2022-05-02: qty 10

## 2022-05-02 MED ORDER — BUPIVACAINE-EPINEPHRINE (PF) 0.5% -1:200000 IJ SOLN
INTRAMUSCULAR | Status: DC | PRN
  Administered 2022-05-02: 20 mL via PERINEURAL

## 2022-05-02 MED ORDER — LIDOCAINE HCL (PF) 2 % IJ SOLN
INTRAMUSCULAR | Status: AC
  Filled 2022-05-02: qty 5

## 2022-05-02 MED ORDER — ONDANSETRON HCL 4 MG/2ML IJ SOLN
INTRAMUSCULAR | Status: AC
  Filled 2022-05-02: qty 2

## 2022-05-02 MED ORDER — ONDANSETRON 8 MG PO TBDP: 8.0000 mg | ORAL_TABLET | Freq: Three times a day (TID) | ORAL | 0 refills | Status: DC | PRN

## 2022-05-02 MED ORDER — LIDOCAINE 2% (20 MG/ML) 5 ML SYRINGE
INTRAMUSCULAR | Status: DC | PRN
  Administered 2022-05-02: 80 mg via INTRAVENOUS

## 2022-05-02 MED ORDER — DEXAMETHASONE SODIUM PHOSPHATE 10 MG/ML IJ SOLN
INTRAMUSCULAR | Status: DC | PRN
  Administered 2022-05-02: 5 mg via INTRAVENOUS

## 2022-05-02 MED ORDER — KETOROLAC TROMETHAMINE 30 MG/ML IJ SOLN
INTRAMUSCULAR | Status: AC
  Filled 2022-05-02: qty 1

## 2022-05-02 MED ORDER — BUPIVACAINE-EPINEPHRINE (PF) 0.5% -1:200000 IJ SOLN
INTRAMUSCULAR | Status: AC
  Filled 2022-05-02: qty 30

## 2022-05-02 MED ORDER — HYDROCODONE-ACETAMINOPHEN 5-325 MG PO TABS: 1.0000 | ORAL_TABLET | Freq: Four times a day (QID) | ORAL | 0 refills | Status: AC | PRN

## 2022-05-02 MED ORDER — ORAL CARE MOUTH RINSE: 15.0000 mL | Freq: Once | OROMUCOSAL | Status: AC

## 2022-05-02 MED ORDER — DEXMEDETOMIDINE HCL IN NACL 80 MCG/20ML IV SOLN
INTRAVENOUS | Status: DC | PRN
  Administered 2022-05-02 (×3): 4 ug via BUCCAL

## 2022-05-02 MED ORDER — SUGAMMADEX SODIUM 200 MG/2ML IV SOLN
INTRAVENOUS | Status: DC | PRN
  Administered 2022-05-02: 177 mg via INTRAVENOUS

## 2022-05-02 MED ORDER — ROCURONIUM BROMIDE 10 MG/ML (PF) SYRINGE
PREFILLED_SYRINGE | INTRAVENOUS | Status: DC | PRN
  Administered 2022-05-02: 80 mg via INTRAVENOUS

## 2022-05-02 MED ORDER — HYDROCODONE-ACETAMINOPHEN 5-325 MG PO TABS: 1.0000 | ORAL_TABLET | Freq: Four times a day (QID) | ORAL | 0 refills | Status: DC | PRN

## 2022-05-02 MED ORDER — CHLORHEXIDINE GLUCONATE 0.12 % MT SOLN
15.0000 mL | Freq: Once | OROMUCOSAL | Status: AC
  Administered 2022-05-02: 15 mL via OROMUCOSAL

## 2022-05-02 MED ORDER — 0.9 % SODIUM CHLORIDE (POUR BTL) OPTIME
TOPICAL | Status: DC | PRN
  Administered 2022-05-02: 1000 mL

## 2022-05-02 MED ORDER — MIDAZOLAM HCL 5 MG/5ML IJ SOLN
INTRAMUSCULAR | Status: DC | PRN
  Administered 2022-05-02: 2 mg via INTRAVENOUS

## 2022-05-02 MED ORDER — ONDANSETRON HCL 4 MG/2ML IJ SOLN
INTRAMUSCULAR | Status: DC | PRN
  Administered 2022-05-02: 4 mg via INTRAVENOUS

## 2022-05-02 MED ORDER — CEFAZOLIN SODIUM-DEXTROSE 2-4 GM/100ML-% IV SOLN
2.0000 g | INTRAVENOUS | Status: AC
  Administered 2022-05-02: 2 g via INTRAVENOUS
  Filled 2022-05-02: qty 100

## 2022-05-02 MED ORDER — PROPOFOL 10 MG/ML IV BOLUS
INTRAVENOUS | Status: DC | PRN
  Administered 2022-05-02: 150 mg via INTRAVENOUS

## 2022-05-02 MED ORDER — MIDAZOLAM HCL 2 MG/2ML IJ SOLN
INTRAMUSCULAR | Status: AC
  Filled 2022-05-02: qty 2

## 2022-05-02 MED ORDER — FENTANYL CITRATE (PF) 250 MCG/5ML IJ SOLN
INTRAMUSCULAR | Status: AC
  Filled 2022-05-02: qty 5

## 2022-05-02 MED ORDER — FENTANYL CITRATE (PF) 250 MCG/5ML IJ SOLN
INTRAMUSCULAR | Status: DC | PRN
  Administered 2022-05-02 (×5): 50 ug via INTRAVENOUS

## 2022-05-02 MED ORDER — NALOXONE HCL 0.4 MG/ML IJ SOLN
INTRAMUSCULAR | Status: AC
  Filled 2022-05-02: qty 1

## 2022-05-02 MED ORDER — LACTATED RINGERS IV SOLN: INTRAVENOUS | Status: DC

## 2022-05-02 MED ORDER — POVIDONE-IODINE 10 % EX SWAB
2.0000 | Freq: Once | CUTANEOUS | Status: AC
  Administered 2022-05-02: 2 via TOPICAL

## 2022-05-02 MED ORDER — KETOROLAC TROMETHAMINE 30 MG/ML IJ SOLN
INTRAMUSCULAR | Status: DC | PRN
  Administered 2022-05-02: 30 mg via INTRAVENOUS

## 2022-05-02 SURGICAL SUPPLY — 39 items
APPLIER CLIP 13 LRG OPEN (CLIP)
APR CLP LRG 13 20 CLIP (CLIP)
BAG HAMPER (MISCELLANEOUS) ×2 IMPLANT
BLADE SURG SZ10 CARB STEEL (BLADE) IMPLANT
CLIP APPLIE 13 LRG OPEN (CLIP) IMPLANT
CLOTH BEACON ORANGE TIMEOUT ST (SAFETY) ×2 IMPLANT
COVER LIGHT HANDLE STERIS (MISCELLANEOUS) ×4 IMPLANT
DRAPE HALF SHEET 40X57 (DRAPES) ×2 IMPLANT
DRAPE STERI URO 9X17 APER PCH (DRAPES) ×2 IMPLANT
ELECT REM PT RETURN 9FT ADLT (ELECTROSURGICAL) ×1
ELECTRODE REM PT RTRN 9FT ADLT (ELECTROSURGICAL) ×2 IMPLANT
GAUZE 4X4 16PLY ~~LOC~~+RFID DBL (SPONGE) ×2 IMPLANT
GLOVE BIOGEL PI IND STRL 7.0 (GLOVE) ×4 IMPLANT
GLOVE BIOGEL PI IND STRL 8 (GLOVE) ×2 IMPLANT
GLOVE ECLIPSE 8.0 STRL XLNG CF (GLOVE) ×4 IMPLANT
GOWN STRL REUS W/TWL LRG LVL3 (GOWN DISPOSABLE) ×4 IMPLANT
GOWN STRL REUS W/TWL XL LVL3 (GOWN DISPOSABLE) ×2 IMPLANT
IV NS IRRIG 3000ML ARTHROMATIC (IV SOLUTION) ×2 IMPLANT
KIT BLADEGUARD II DBL (SET/KITS/TRAYS/PACK) ×2 IMPLANT
KIT TURNOVER CYSTO (KITS) ×2 IMPLANT
MANIFOLD NEPTUNE II (INSTRUMENTS) ×2 IMPLANT
NDL HYPO 21X1.5 SAFETY (NEEDLE) ×2 IMPLANT
NEEDLE HYPO 21X1.5 SAFETY (NEEDLE) ×1 IMPLANT
NS IRRIG 1000ML POUR BTL (IV SOLUTION) ×2 IMPLANT
PACK PERI GYN (CUSTOM PROCEDURE TRAY) ×2 IMPLANT
PAD ARMBOARD 7.5X6 YLW CONV (MISCELLANEOUS) ×2 IMPLANT
SET BASIN LINEN APH (SET/KITS/TRAYS/PACK) ×2 IMPLANT
SUT MNCRL+ AB 3-0 CT1 36 (SUTURE) IMPLANT
SUT MON AB 3-0 SH 27 (SUTURE) IMPLANT
SUT MONOCRYL AB 3-0 CT1 36IN (SUTURE)
SUT VIC AB 0 CT1 27 (SUTURE) ×3
SUT VIC AB 0 CT1 27XCR 8 STRN (SUTURE) ×4 IMPLANT
SYR BULB IRRIG 60ML STRL (SYRINGE) IMPLANT
SYR CONTROL 10ML LL (SYRINGE) ×2 IMPLANT
TRAY FOLEY MTR SLVR 16FR STAT (SET/KITS/TRAYS/PACK) IMPLANT
TRAY FOLEY W/BAG SLVR 16FR (SET/KITS/TRAYS/PACK)
TRAY FOLEY W/BAG SLVR 16FR ST (SET/KITS/TRAYS/PACK) IMPLANT
VERSALIGHT (MISCELLANEOUS) ×2 IMPLANT
WATER STERILE IRR 1000ML POUR (IV SOLUTION) ×2 IMPLANT

## 2022-05-02 NOTE — Anesthesia Preprocedure Evaluation (Signed)
Anesthesia Evaluation  Patient identified by MRN, date of birth, ID band Patient awake    Reviewed: Allergy & Precautions, NPO status , Patient's Chart, lab work & pertinent test results  History of Anesthesia Complications (+) Emergence Delirium and history of anesthetic complications  Airway Mallampati: II  TM Distance: >3 FB     Dental  (+) Teeth Intact   Pulmonary asthma , former smoker   Pulmonary exam normal        Cardiovascular Exercise Tolerance: Good hypertension, Pt. on medications + Valvular Problems/Murmurs (murmur)  Rhythm:Regular Rate:Normal     Neuro/Psych  Headaches PSYCHIATRIC DISORDERS (ADHD)      ADD   GI/Hepatic negative GI ROS, Neg liver ROS,,,  Endo/Other  negative endocrine ROS    Renal/GU negative Renal ROS     Musculoskeletal  (+)  Fibromyalgia -  Abdominal   Peds  Hematology negative hematology ROS (+) Blood dyscrasia, anemia   Anesthesia Other Findings   Reproductive/Obstetrics                             Anesthesia Physical Anesthesia Plan  ASA: 3  Anesthesia Plan: General   Post-op Pain Management:    Induction: Intravenous  PONV Risk Score and Plan: 3 and Dexamethasone and Ondansetron  Airway Management Planned: Oral ETT  Additional Equipment:   Intra-op Plan:   Post-operative Plan: Extubation in OR  Informed Consent: I have reviewed the patients History and Physical, chart, labs and discussed the procedure including the risks, benefits and alternatives for the proposed anesthesia with the patient or authorized representative who has indicated his/her understanding and acceptance.       Plan Discussed with: CRNA  Anesthesia Plan Comments:         Anesthesia Quick Evaluation

## 2022-05-02 NOTE — Op Note (Signed)
Preoperative diagnosis:  1.  Abnormal uterine bleeding                                         2.  Dysmenorrhea                                         3.  Grade 2 uterine prolapse                                         4.  Dyspareunia due to prolapse  Postoperative diagnosis:  Same as above   Procedure:  Vaginal hysterectomy  Surgeon:  Lazaro Arms MD  Anesthesia:  General Endotracheal  Findings:  Grade 2 descent, normal tubes and ovaries, soft boggy uterus-->?adenomyosis    Description of operation:  The patient was taken from the preoperative area to the operating room in stable condition. She underwent GET anesthesia ands  she was placed in the dorsal lithotomy position. Patient was prepped and draped in the usual sterile fashion and a Foley catheter was placed.  A weighted speculum was placed and the cervix was grasped with thyroid tenaculums both anteriorly and posteriorly.  0.5% Marcainewith 1/200,000 epinephrine was injected in a circumferential fashion about the cervix and the electrocautery unit was used to incise the vagina and push at all cervix.  The posterior cul-de-sac was then entered sharply without difficulty.  The uterosacral ligaments were clamped cut and inspection suture ligated and held.  The cardinal ligaments were then clamped cut transfixion suture ligated and cut. The anterior peritoneum was identified the anterior cul-de-sac was entered sharply without difficulty. The anterior and posterior leaves of the broad ligament were plicated and the uterine vessels were clamped cut and suture ligated. Serial pedicles were taken of the fundus with each pedicle being clamped cut and suture ligated. The utero-ovarian ligaments were crossclamped the uterus was removed and both pedicles were transfixion suture ligated. There was good hemostasis of all the pedicles.  The anterior posterior vagina and peritoneum were closed in interrupted fashion with good resultant hemostasis. Prior  to  closure the lower pelvis and vagina were irrigated vigorously.  The sponge needle and instrument counts were correct x 3.  Total blood loss for the procedure was 250 cc.  The patient received 2 g of Ancef and 30 mg of Toradol IV preoperatively prophylactically.  She was taken to the recovery room in good stable condition awake alert doing well.  Lazaro Arms 05/02/2022, 1:09 PM

## 2022-05-02 NOTE — Transfer of Care (Signed)
Immediate Anesthesia Transfer of Care Note  Patient: Deanna Friedman  Procedure(s) Performed: HYSTERECTOMY VAGINAL, total (Vagina )  Patient Location: PACU  Anesthesia Type:General  Level of Consciousness: awake, alert , and oriented  Airway & Oxygen Therapy: Patient Spontanous Breathing and Patient connected to nasal cannula oxygen  Post-op Assessment: Report given to RN, Post -op Vital signs reviewed and stable, Patient moving all extremities X 4, and Patient able to stick tongue midline  Post vital signs: Reviewed  Last Vitals:  Vitals Value Taken Time  BP 93/63 05/02/22 1315  Temp 36.7 C 05/02/22 1305  Pulse 91 05/02/22 1321  Resp 16 05/02/22 1321  SpO2 100 % 05/02/22 1321  Vitals shown include unvalidated device data.  Last Pain:  Vitals:   05/02/22 1002  TempSrc: Oral  PainSc:          Complications: No notable events documented.

## 2022-05-02 NOTE — H&P (Signed)
Preoperative History and Physical  Deanna Friedman is a 46 y.o. [phone] with Patient's last menstrual period was 04/21/2022 (within weeks). admitted for a TVH possible vagina vault suspension possible removal of tubes indicated procedures .  Long standing history of bleeding issues   Deanna Friedman is a 47 y.o. [phone] female being seen today to review surgical management.   In review, plan was to proceed with [facility], BS in March; however due to issues with her kneed she needed to reschedule.  She presents today to review management.   In review, she has struggled with HMB and pelvic pain..      -AUB/HMB: Menses last for about 5 days.  First 3 days, notes heavy bleeding using thick pads with changes every hour.  Also notes some irregularity in her cycle sometimes a week early or late. Currently menses about q 35days.      Pelvic pain: Notes significant dysmenorrhea since teenager.  Typically last the first few days of her period.  Still using muscle relaxer and heating pad.  Pain exacerbated by her fibromyalgia.   BTL in 2018    Of note, ablation was attempted by Dr. 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   She reports minimal change since her last visit. Prior medication caused significant mood changes and other side effects.  PMH:    Past Medical History:  Diagnosis Date   ADHD (attention deficit hyperactivity disorder)    Allergy    food, meds, mold, dust   Anemia    Asthma    Blood transfusion without reported diagnosis    childbirth   Body aches 11/29/2014   BV (bacterial vaginosis) 10/14/2013   Complication of anesthesia    'I wake up combative".   Contraceptive management 03/31/2014   Decreased sex drive [id number]/77/8242   Fibromyalgia    Folliculitis 01/27/2015   Heart murmur    HTN (hypertension)    Hypercholesteremia 10/09/2016   Migraines    Multiple allergies    Obesity 10/14/2013   Raynaud's disease    Vaginal itching  12/30/2014    PSH:     Past Surgical History:  Procedure Laterality Date   ANTERIOR CRUCIATE LIGAMENT REPAIR Left 01/29/2017   BREAST LUMPECTOMY Right    fibroadenoma   DILATION AND CURETTAGE OF UTERUS  2011   DILITATION & CURRETTAGE/HYSTROSCOPY WITH NOVASURE ABLATION N/A 05/07/2017   Procedure: DILATATION & CURETTAGE/HYSTEROSCOPY;  Surgeon: Tilda Burrow, MD;  Location: [facility];  Service: Gynecology;  Laterality: N/A;   KNEE ARTHROSCOPY Bilateral    LAPAROSCOPIC BILATERAL SALPINGECTOMY Bilateral 05/07/2017   Procedure: LAPAROSCOPIC BILATERAL SALPINGECTOMY;  Surgeon: Tilda Burrow, MD;  Location: [facility];  Service: Gynecology;  Laterality: Bilateral;   UMBILICAL HERNIA REPAIR N/A 05/07/2017   Procedure: HERNIA REPAIR UMBILICAL ADULT;  Surgeon: Tilda Burrow, MD;  Location: [facility];  Service: Gynecology;  Laterality: N/A;    POb/GynH:      OB History     Gravida  3   Para  3   Term  3   Preterm      AB      Living  3      SAB      IAB      Ectopic      Multiple      Live Births  3           SH:   Social History   Tobacco Use   Smoking  status: Former    Packs/day: 0.25    Years: 5.00    Total pack years: 1.25    Types: Cigarettes    Quit date: 05/03/1999    Years since quitting: 23.0   Smokeless tobacco: Never  Vaping Use   Vaping Use: Never used  Substance Use Topics   Alcohol use: Yes    Comment: social   Drug use: No    FH:    Family History  Problem Relation Age of Onset   Hypertension Mother    Heart disease Mother    Alcohol abuse Mother    Arthritis Mother    Asthma Mother    Depression Mother    Alcohol abuse Maternal Grandfather    Hypertension Paternal Grandmother    Diabetes Paternal Grandmother    Cancer Paternal Grandfather        lung   Graves' disease Sister    Other Maternal Grandmother        was murdered   Early death Maternal Grandmother    Alcohol abuse Father    Arthritis Father      Allergies:   Allergies  Allergen Reactions   Nutritional Supplements Anaphylaxis    Pitted fruit, highly reactive to cherries 05/07/17 tolerated Propofol   Shellfish Allergy Hives   Other Hives    Pitted Fruits   Aloe Hives   Cephalexin Other (See Comments)    Severe headache   Coconut Fatty Acids Hives    Coconut eaten   Dust Mite Extract Hives   Food Hives    Tree nuts   Percodan [Oxycodone-Aspirin] Other (See Comments)    Serious headache    Medications:       Current Facility-Administered Medications:    ceFAZolin (ANCEF) IVPB 2g/100 mL premix, 2 g, Intravenous, On Call to OR, Lazaro ArmsEure, Prithvi Kooi H, MD   chlorhexidine (PERIDEX) 0.12 % solution 15 mL, 15 mL, Mouth/Throat, Once **OR** Oral care mouth rinse, 15 mL, Mouth Rinse, Once, Pugh, Tania Adeodney V, MD   lactated ringers infusion, , Intravenous, Continuous, Pugh, Tania Adeodney V, MD, Last Rate: 10 mL/hr at 05/02/22 1020, Continued from Pre-op at 05/02/22 1020  Review of Systems:   Review of Systems  Constitutional: Negative for fever, chills, weight loss, malaise/fatigue and diaphoresis.  HENT: Negative for hearing loss, ear pain, nosebleeds, congestion, sore throat, neck pain, tinnitus and ear discharge.   Eyes: Negative for blurred vision, double vision, photophobia, pain, discharge and redness.  Respiratory: Negative for cough, hemoptysis, sputum production, shortness of breath, wheezing and stridor.   Cardiovascular: Negative for chest pain, palpitations, orthopnea, claudication, leg swelling and PND.  Gastrointestinal: Positive for abdominal pain. Negative for heartburn, nausea, vomiting, diarrhea, constipation, blood in stool and melena.  Genitourinary: Negative for dysuria, urgency, frequency, hematuria and flank pain.  Musculoskeletal: Negative for myalgias, back pain, joint pain and falls.  Skin: Negative for itching and rash.  Neurological: Negative for dizziness, tingling, tremors, sensory change, speech change, focal weakness, seizures,  loss of consciousness, weakness and headaches.  Endo/Heme/Allergies: Negative for environmental allergies and polydipsia. Does not bruise/bleed easily.  Psychiatric/Behavioral: Negative for depression, suicidal ideas, hallucinations, memory loss and substance abuse. The patient is not nervous/anxious and does not have insomnia.      PHYSICAL EXAM:  Blood pressure 114/86, pulse 78, temperature 98 F (36.7 C), temperature source Oral, resp. rate 16, height 5\' 2"  (1.575 m), weight 88.5 kg, last menstrual period 04/21/2022, SpO2 100 %.    Vitals reviewed. Constitutional: She is oriented to person,  place, and time. She appears well-developed and well-nourished.  HENT:  Head: Normocephalic and atraumatic.  Right Ear: External ear normal.  Left Ear: External ear normal.  Nose: Nose normal.  Mouth/Throat: Oropharynx is clear and moist.  Eyes: Conjunctivae and EOM are normal. Pupils are equal, round, and reactive to light. Right eye exhibits no discharge. Left eye exhibits no discharge. No scleral icterus.  Neck: Normal range of motion. Neck supple. No tracheal deviation present. No thyromegaly present.  Cardiovascular: Normal rate, regular rhythm, normal heart sounds and intact distal pulses.  Exam reveals no gallop and no friction rub.   No murmur heard. Respiratory: Effort normal and breath sounds normal. No respiratory distress. She has no wheezes. She has no rales. She exhibits no tenderness.  GI: Soft. Bowel sounds are normal. She exhibits no distension and no mass. There is tenderness. There is no rebound and no guarding.  Genitourinary:       Vulva is normal without lesions Vagina is pink moist without discharge Cervix normal in appearance and pap is normal Uterus is normal size, contour, position, consistency, mobility, non-tender  Grade 2 prolapse as cause of her dysparunia  Adnexa is negative with normal sized ovaries by sonogram  Musculoskeletal: Normal range of motion. She  exhibits no edema and no tenderness.  Neurological: She is alert and oriented to person, place, and time. She has normal reflexes. She displays normal reflexes. No cranial nerve deficit. She exhibits normal muscle tone. Coordination normal.  Skin: Skin is warm and dry. No rash noted. No erythema. No pallor.  Psychiatric: She has a normal mood and affect. Her behavior is normal. Judgment and thought content normal.    Labs: Results for orders placed or performed during the hospital encounter of 04/30/22 (from the past 336 hour(s))  CBC   Collection Time: 04/30/22  9:49 AM  Result Value Ref Range   WBC 4.8 4.0 - 10.5 K/uL   RBC 4.58 3.87 - 5.11 MIL/uL   Hemoglobin 14.8 12.0 - 15.0 g/dL   HCT 30.0 76.2 - 26.3 %   MCV 96.1 80.0 - 100.0 fL   MCH 32.3 26.0 - 34.0 pg   MCHC 33.6 30.0 - 36.0 g/dL   RDW 33.5 45.6 - 25.6 %   Platelets 229 150 - 400 K/uL   nRBC 0.0 0.0 - 0.2 %  RPR   Collection Time: 04/30/22  9:49 AM  Result Value Ref Range   RPR Ser Ql NON REACTIVE NON REACTIVE  Comprehensive metabolic panel   Collection Time: 04/30/22  9:49 AM  Result Value Ref Range   Sodium 138 135 - 145 mmol/L   Potassium 3.0 (L) 3.5 - 5.1 mmol/L   Chloride 103 98 - 111 mmol/L   CO2 29 22 - 32 mmol/L   Glucose, Bld 95 70 - 99 mg/dL   BUN 15 6 - 20 mg/dL   Creatinine, Ser 3.89 0.44 - 1.00 mg/dL   Calcium 9.0 8.9 - 37.3 mg/dL   Total Protein 7.4 6.5 - 8.1 g/dL   Albumin 3.9 3.5 - 5.0 g/dL   AST 28 15 - 41 U/L   ALT 39 0 - 44 U/L   Alkaline Phosphatase 57 38 - 126 U/L   Total Bilirubin 0.6 0.3 - 1.2 mg/dL   GFR, Estimated >42 >87 mL/min   Anion gap 6 5 - 15  Urinalysis, Routine w reflex microscopic Urine, Clean Catch   Collection Time: 04/30/22  9:49 AM  Result Value Ref Range  Color, Urine STRAW (A) YELLOW   APPearance CLEAR CLEAR   Specific Gravity, Urine 1.008 1.005 - 1.030   pH 7.0 5.0 - 8.0   Glucose, UA NEGATIVE NEGATIVE mg/dL   Hgb urine dipstick SMALL (A) NEGATIVE   Bilirubin  Urine NEGATIVE NEGATIVE   Ketones, ur NEGATIVE NEGATIVE mg/dL   Protein, ur NEGATIVE NEGATIVE mg/dL   Nitrite NEGATIVE NEGATIVE   Leukocytes,Ua TRACE (A) NEGATIVE   RBC / HPF 0-5 0 - 5 RBC/hpf   WBC, UA 0-5 0 - 5 WBC/hpf   Bacteria, UA RARE (A) NONE SEEN   Squamous Epithelial / LPF 0-5 0 - 5  Rapid HIV screen (HIV 1/2 Ab+Ag)   Collection Time: 04/30/22  9:50 AM  Result Value Ref Range   HIV-1 P24 Antigen - HIV24 NON REACTIVE NON REACTIVE   HIV 1/2 Antibodies NON REACTIVE NON REACTIVE   Interpretation (HIV Ag Ab)      A non reactive test result means that HIV 1 or HIV 2 antibodies and HIV 1 p24 antigen were not detected in the specimen.  Type and screen   Collection Time: 04/30/22  9:50 AM  Result Value Ref Range   ABO/RH(D) A POS    Antibody Screen NEG    Sample Expiration 05/14/2022,2359    Extend sample reason      NO TRANSFUSIONS OR PREGNANCY IN THE PAST 3 MONTHS Performed at [facility], [address], [place], [place] [address]   Pregnancy, urine POC   Collection Time: 04/30/22 10:09 AM  Result Value Ref Range   Preg Test, Ur NEGATIVE NEGATIVE    EKG: Orders placed or performed during the hospital encounter of 04/30/22   EKG 12-Lead   EKG 12-Lead    Imaging Studies: No results found.    Assessment: 1. Abnormal uterine bleeding  N93.9       2. Dysmenorrhea  N94.6       3. Uterine prolapse, Grade 2  N81.4         Plan: TVH vaginal vault suspension if needed possible bilateral salpingectomy  Lazaro Arms 05/02/2022 10:25 AM

## 2022-05-02 NOTE — Anesthesia Procedure Notes (Signed)
Procedure Name: Intubation Date/Time: 05/02/2022 11:17 AM  Performed by: Cy Blamer, CRNAPre-anesthesia Checklist: Patient identified, Emergency Drugs available, Suction available and Patient being monitored Patient Re-evaluated:Patient Re-evaluated prior to induction Oxygen Delivery Method: Circle system utilized Preoxygenation: Pre-oxygenation with 100% oxygen Induction Type: IV induction Ventilation: Mask ventilation without difficulty Laryngoscope Size: Miller and 2 Grade View: Grade I Tube type: Oral Tube size: 7.0 mm Number of attempts: 1 Airway Equipment and Method: Bite block Placement Confirmation: ETT inserted through vocal cords under direct vision, positive ETCO2 and breath sounds checked- equal and bilateral Secured at: 21 cm Tube secured with: Tape Dental Injury: Teeth and Oropharynx as per pre-operative assessment

## 2022-05-03 LAB — SURGICAL PATHOLOGY

## 2022-05-03 NOTE — Anesthesia Postprocedure Evaluation (Signed)
Anesthesia Post Note  Patient: Deanna Friedman  Procedure(s) Performed: HYSTERECTOMY VAGINAL, total (Vagina )  Patient location during evaluation: PACU Anesthesia Type: General Level of consciousness: awake and alert Pain management: pain level controlled Vital Signs Assessment: post-procedure vital signs reviewed and stable Respiratory status: spontaneous breathing, nonlabored ventilation, respiratory function stable and patient connected to nasal cannula oxygen Cardiovascular status: blood pressure returned to baseline and stable Postop Assessment: no apparent nausea or vomiting Anesthetic complications: no   There were no known notable events for this encounter.   Last Vitals:  Vitals:   05/02/22 1415 05/02/22 1453  BP: 119/78 124/80  Pulse: 62 64  Resp: 12 14  Temp:  36.4 C  SpO2: 99% 100%    Last Pain:  Vitals:   05/02/22 1453  TempSrc: Oral  PainSc: 4                  Glynis Smiles

## 2022-05-09 ENCOUNTER — Encounter (HOSPITAL_COMMUNITY): Payer: Self-pay | Admitting: Obstetrics & Gynecology

## 2022-05-10 ENCOUNTER — Encounter: Payer: Self-pay | Admitting: Obstetrics & Gynecology

## 2022-05-10 ENCOUNTER — Ambulatory Visit (INDEPENDENT_AMBULATORY_CARE_PROVIDER_SITE_OTHER): Payer: BC Managed Care – PPO | Admitting: Obstetrics & Gynecology

## 2022-05-10 VITALS — BP 125/81 | HR 82

## 2022-05-10 DIAGNOSIS — Z9889 Other specified postprocedural states: Secondary | ICD-10-CM

## 2022-05-10 NOTE — Progress Notes (Signed)
HPI: Patient returns for routine postoperative follow-up having undergone TVH vv suspension on 05/02/22.  The patient's immediate postoperative recovery has been unremarkable. Since hospital discharge the patient reports no problems with the surgery itself.   Current Outpatient Medications: albuterol (PROVENTIL HFA;VENTOLIN HFA) 108 (90 BASE) MCG/ACT inhaler, Inhale 2 puffs into the lungs every 6 (six) hours as needed for wheezing or shortness of breath., Disp: 1 Inhaler, Rfl: 0 amphetamine-dextroamphetamine (ADDERALL XR) 30 MG 24 hr capsule, Take 30 mg by mouth every morning., Disp: , Rfl:  ASHWAGANDHA PO, Take 300 mg by mouth in the morning and at bedtime., Disp: , Rfl:  Bioflavonoid Products (VITAMIN C) CHEW, Chew 1 each by mouth daily. 1000 mg, Disp: , Rfl:  Cholecalciferol (VITAMIN D3) 125 MCG (5000 UT) TABS, Take 5,000 Units by mouth daily., Disp: , Rfl:  ciprofloxacin (CIPRO) 500 MG tablet, Take 1 tablet (500 mg total) by mouth 2 (two) times daily., Disp: 14 tablet, Rfl: 0 diazepam (VALIUM) 10 MG tablet, Take 5 mg by mouth at bedtime as needed (sleep)., Disp: , Rfl:  diphenhydrAMINE (BENADRYL) 25 MG tablet, Take 25 mg by mouth every 6 (six) hours as needed for allergies., Disp: , Rfl:  hydrochlorothiazide (MICROZIDE) 12.5 MG capsule, TAKE 1 CAPSULE(12.5 MG) BY MOUTH DAILY, Disp: 30 capsule, Rfl: 12 levocetirizine (XYZAL) 5 MG tablet, Take 5 mg by mouth every evening., Disp: , Rfl:  losartan (COZAAR) 50 MG tablet, Take 50 mg by mouth daily., Disp: , Rfl:  Magnesium 250 MG TABS, Take 250 mg by mouth in the morning and at bedtime., Disp: , Rfl:  Melatonin 5 MG CAPS, Take 5 mg by mouth at bedtime as needed (sleep)., Disp: , Rfl:  methocarbamol (ROBAXIN) 750 MG tablet, Take 750 mg by mouth at bedtime., Disp: , Rfl:  mometasone (ELOCON) 0.1 % cream, Apply 1 application topically daily. (Patient taking differently: Apply 1 application  topically daily as needed (hives).), Disp: 45 g, Rfl:  1 montelukast (SINGULAIR) 10 MG tablet, Take 1 tablet (10 mg total) by mouth at bedtime., Disp: 30 tablet, Rfl: 3 Nutritional Supplements (ADRENAL COMPLEX PO), Take 1 capsule by mouth daily. Adrenal Health, Disp: , Rfl:  Potassium Gluconate 550 (90 K) MG TABS, Take 550 mg by mouth daily., Disp: , Rfl:  pregabalin (LYRICA) 100 MG capsule, Take 100 mg by mouth 3 (three) times daily., Disp: , Rfl:  Turmeric (QC TUMERIC COMPLEX PO), Take 1 capsule by mouth daily., Disp: , Rfl:  HYDROcodone-acetaminophen (NORCO/VICODIN) 5-325 MG tablet, Take 1 tablet by mouth every 6 (six) hours as needed. (Patient not taking: Reported on 05/10/2022), Disp: 28 tablet, Rfl: 0 ondansetron (ZOFRAN-ODT) 8 MG disintegrating tablet, Take 1 tablet (8 mg total) by mouth every 8 (eight) hours as needed for nausea or vomiting. (Patient not taking: Reported on 05/10/2022), Disp: 8 tablet, Rfl: 0  No current facility-administered medications for this visit. Facility-Administered Medications Ordered in Other Visits: sterile water for irrigation for irrigation, , , PRN, Lazaro Arms, MD, 1,000 mL at 05/02/22 1215     Blood pressure 125/81, pulse 82, last menstrual period 04/21/2022.  Physical Exam: Normal post op cuff exam  Diagnostic Tests:   Pathology: benign  Impression + Management plan:   ICD-10-CM   1. S/P TVH vv suspension 809983  Z98.890    no problems         Medications Prescribed this encounter: No orders of the defined types were placed in this encounter.     Follow up: No follow-ups on file.  Florian Buff, MD Attending Physician for [facility] 05/10/2022 11:33 AM

## 2022-06-14 ENCOUNTER — Ambulatory Visit (INDEPENDENT_AMBULATORY_CARE_PROVIDER_SITE_OTHER): Payer: BC Managed Care – PPO | Admitting: Obstetrics & Gynecology

## 2022-06-14 ENCOUNTER — Encounter: Payer: Self-pay | Admitting: Obstetrics & Gynecology

## 2022-06-14 VITALS — BP 127/88 | HR 86 | Ht 62.0 in | Wt 183.0 lb

## 2022-06-14 DIAGNOSIS — Z9889 Other specified postprocedural states: Secondary | ICD-10-CM

## 2022-06-14 NOTE — Progress Notes (Signed)
HPI: Patient returns for routine postoperative follow-up having undergone TVH VV suspension on 05/02/22.  The patient's immediate postoperative recovery has been unremarkable. Since hospital discharge the patient reports no specific problems overall physically doing well.   Current Outpatient Medications: albuterol (PROVENTIL HFA;VENTOLIN HFA) 108 (90 BASE) MCG/ACT inhaler, Inhale 2 puffs into the lungs every 6 (six) hours as needed for wheezing or shortness of breath., Disp: 1 Inhaler, Rfl: 0 amphetamine-dextroamphetamine (ADDERALL XR) 30 MG 24 hr capsule, Take 30 mg by mouth every morning., Disp: , Rfl:  ASHWAGANDHA PO, Take 300 mg by mouth in the morning and at bedtime., Disp: , Rfl:  Bioflavonoid Products (VITAMIN C) CHEW, Chew 1 each by mouth daily. 1000 mg, Disp: , Rfl:  Cholecalciferol (VITAMIN D3) 125 MCG (5000 UT) TABS, Take 5,000 Units by mouth daily., Disp: , Rfl:  diazepam (VALIUM) 10 MG tablet, Take 5 mg by mouth at bedtime as needed (sleep)., Disp: , Rfl:  diphenhydrAMINE (BENADRYL) 25 MG tablet, Take 25 mg by mouth every 6 (six) hours as needed for allergies., Disp: , Rfl:  hydrochlorothiazide (MICROZIDE) 12.5 MG capsule, TAKE 1 CAPSULE(12.5 MG) BY MOUTH DAILY, Disp: 30 capsule, Rfl: 12 levocetirizine (XYZAL) 5 MG tablet, Take 5 mg by mouth every evening., Disp: , Rfl:  losartan (COZAAR) 50 MG tablet, Take 50 mg by mouth daily., Disp: , Rfl:  Magnesium 250 MG TABS, Take 250 mg by mouth in the morning and at bedtime., Disp: , Rfl:  Melatonin 5 MG CAPS, Take 5 mg by mouth at bedtime as needed (sleep)., Disp: , Rfl:  methocarbamol (ROBAXIN) 750 MG tablet, Take 750 mg by mouth at bedtime., Disp: , Rfl:  mometasone (ELOCON) 0.1 % cream, Apply 1 application topically daily. (Patient taking differently: Apply 1 application  topically daily as needed (hives).), Disp: 45 g, Rfl: 1 montelukast (SINGULAIR) 10 MG tablet, Take 1 tablet (10 mg total) by mouth at bedtime., Disp: 30 tablet,  Rfl: 3 Nutritional Supplements (ADRENAL COMPLEX PO), Take 1 capsule by mouth daily. Adrenal Health, Disp: , Rfl:  Potassium Gluconate 550 (90 K) MG TABS, Take 550 mg by mouth daily., Disp: , Rfl:  pregabalin (LYRICA) 100 MG capsule, Take 100 mg by mouth 3 (three) times daily., Disp: , Rfl:  Turmeric (QC TUMERIC COMPLEX PO), Take 1 capsule by mouth daily., Disp: , Rfl:  HYDROcodone-acetaminophen (NORCO/VICODIN) 5-325 MG tablet, Take 1 tablet by mouth every 6 (six) hours as needed. (Patient not taking: Reported on 05/10/2022), Disp: 28 tablet, Rfl: 0 ondansetron (ZOFRAN-ODT) 8 MG disintegrating tablet, Take 1 tablet (8 mg total) by mouth every 8 (eight) hours as needed for nausea or vomiting. (Patient not taking: Reported on 05/10/2022), Disp: 8 tablet, Rfl: 0  No current facility-administered medications for this visit. Facility-Administered Medications Ordered in Other Visits: sterile water for irrigation for irrigation, , , PRN, Florian Buff, MD, 1,000 mL at 05/02/22 1215     Blood pressure 127/88, pulse 86, height 5\' 2"  (1.575 m), weight 183 lb (83 kg), last menstrual period 04/21/2022.  Physical Exam: NEFG Normal vagina Cuff is healing suture material is releasing/dissolving free in vagina Non tender no masses Vaginal apex is very well supported  Diagnostic Tests:   Pathology: benign  Impression + Management plan: (P38.250) S/P TVH vv suspension [id number]  (primary encounter diagnosis)     Medications Prescribed this encounter: No orders of the defined types were placed in this encounter.  The patient took offense and exception to the statement documented in the chart "I  took a few valium and didn't tell nobody" as she was coming out of anesthesia.  The patient states if she said anything like that it was under the influence of anesthesia and she would not have stated it in that way, it didn't sound like her voice.  This serves to dispute the accuracy of the statement   and  the fact it was anesthesia influenced.   Follow up: No follow-ups on file.    Florian Buff, MD Attending Physician for [facility] 06/14/2022 9:15 AM

## 2022-08-18 IMAGING — MG DIGITAL DIAGNOSTIC BILAT W/ TOMO W/ CAD
6 of 10 series · 6 of 30 positions shown · non-contrast
Comparison: None.

CLINICAL DATA: 44-year-old female with a palpable area of concern
in the left breast. Remote history fibroadenoma removal from the
right breast.



[L MLO synth-2D (1 of 2)]
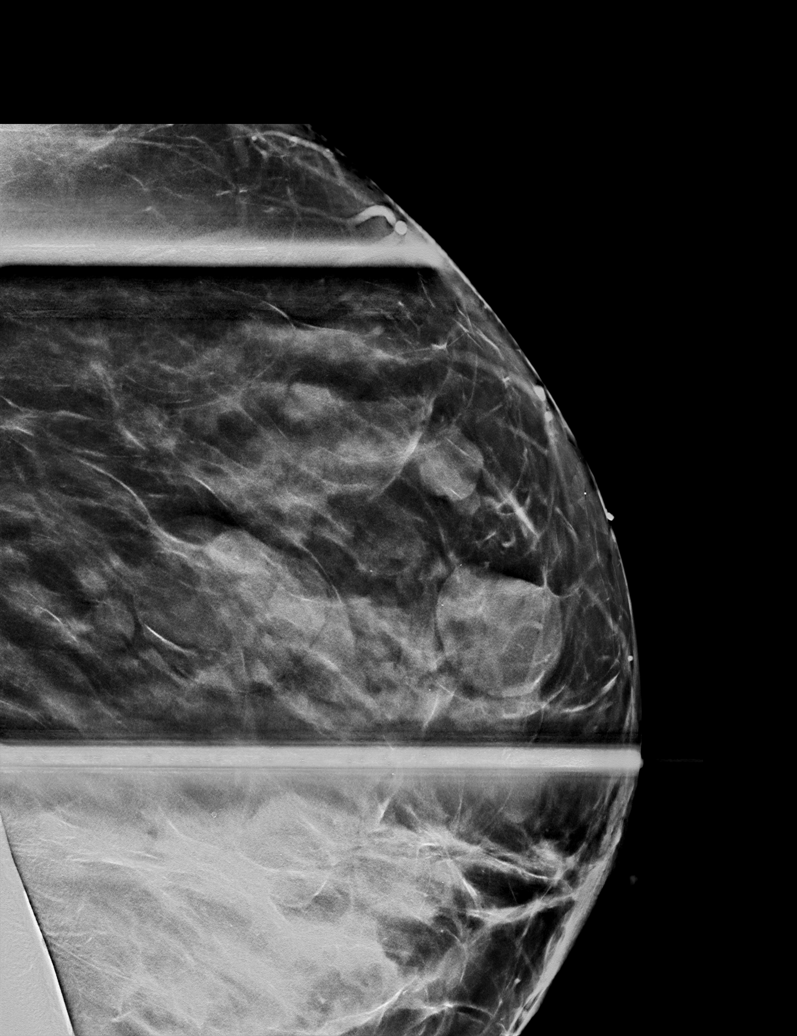

[L MLO synth-2D (2 of 2)]
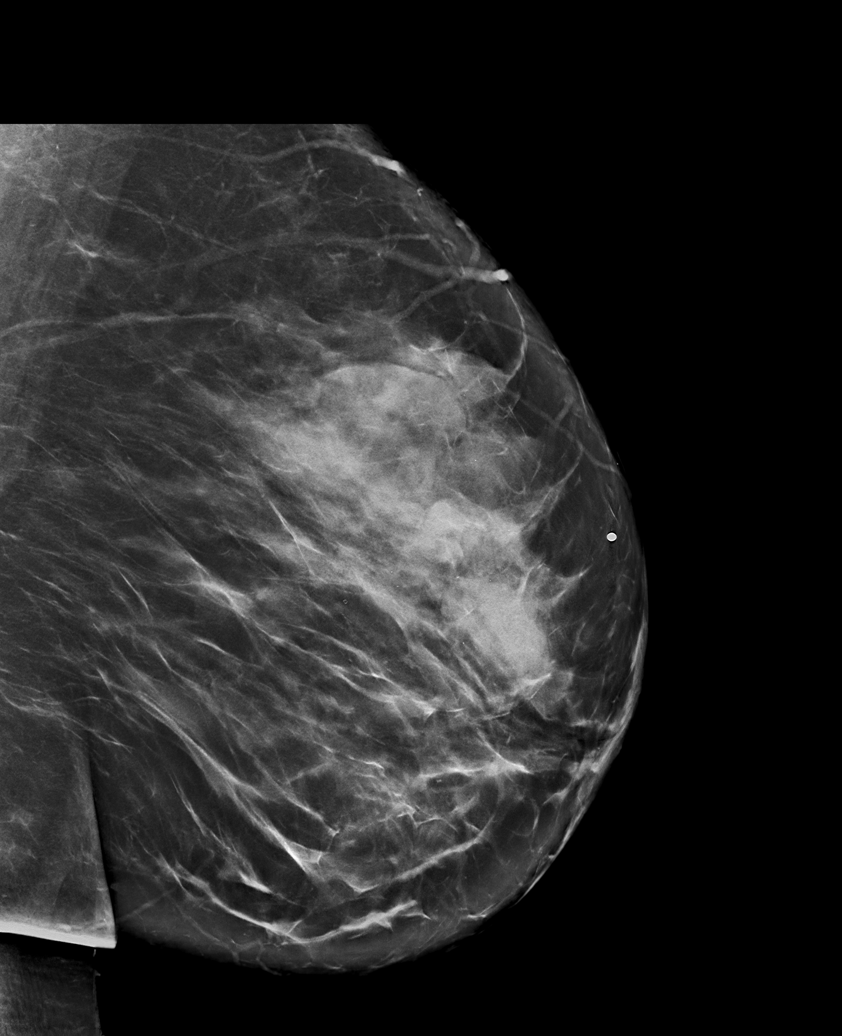

[L CC synth-2D]
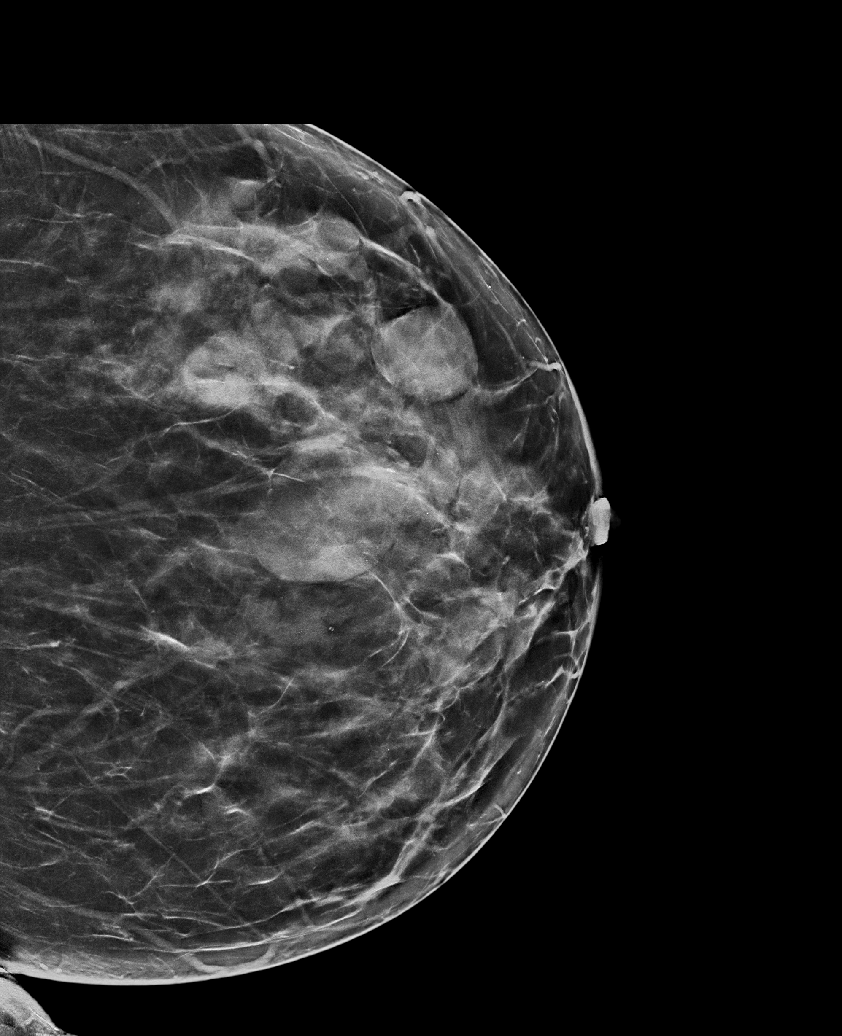

[R MLO synth-2D]
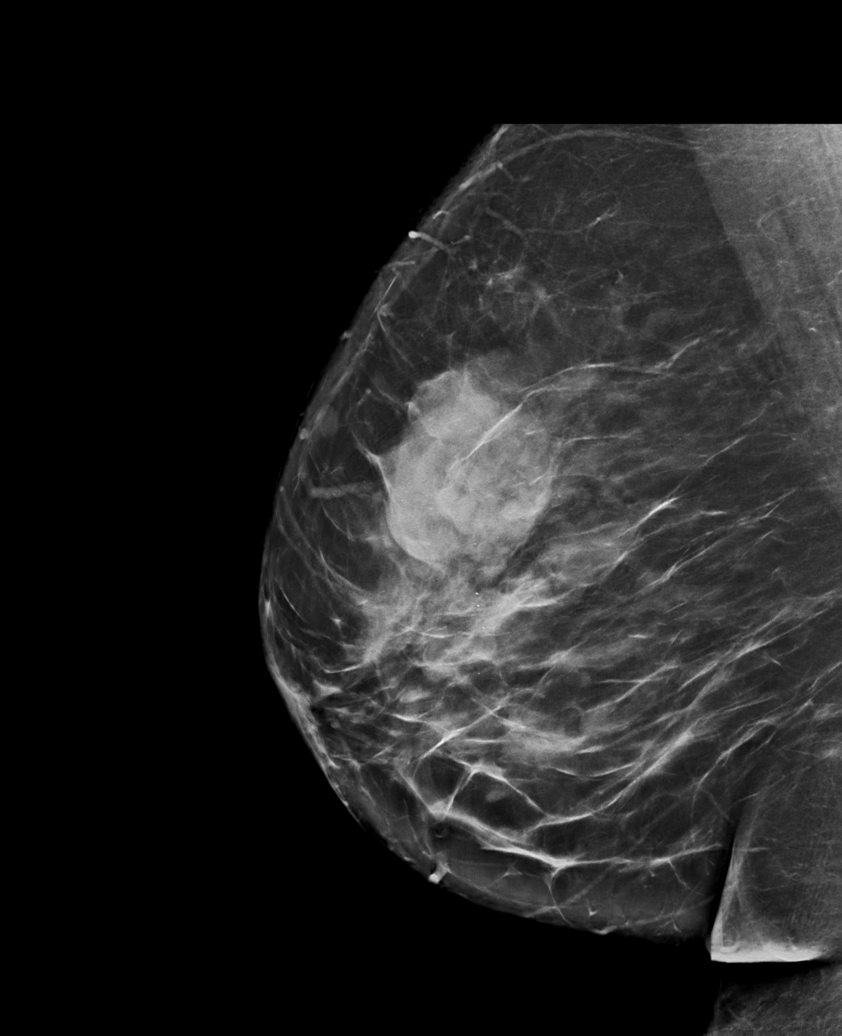

[R CC synth-2D]
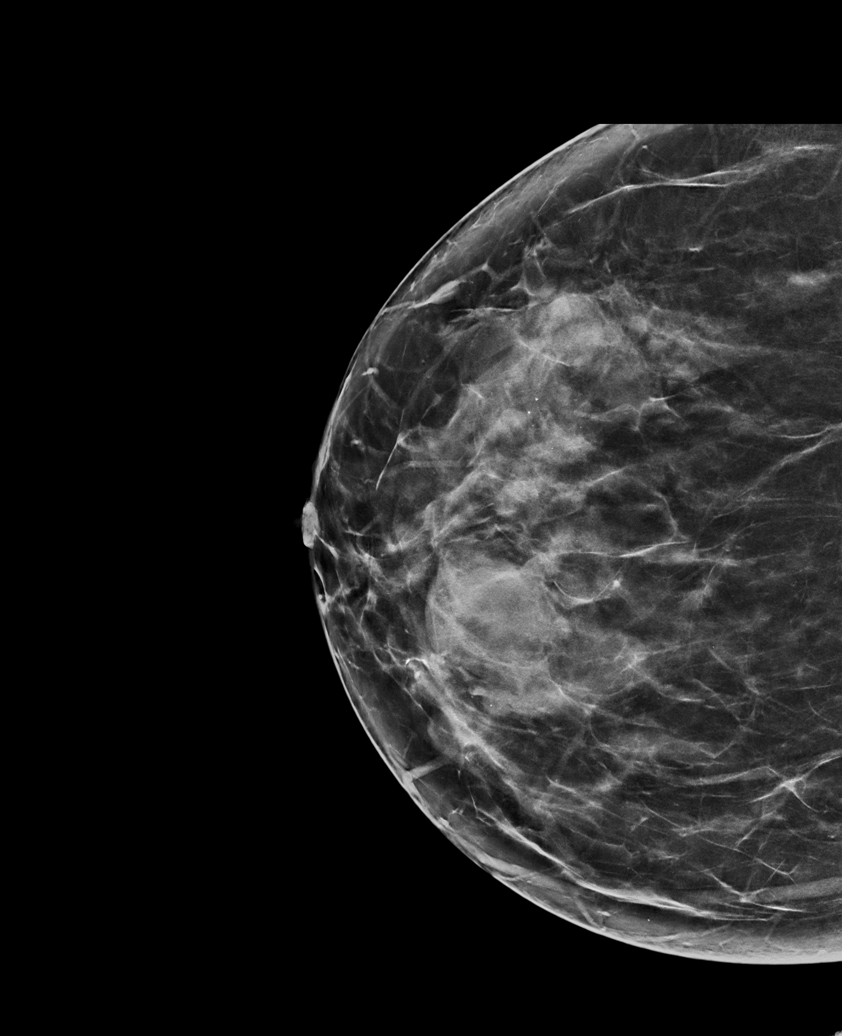

[L CC tomo · tomo slice 43/84.0]
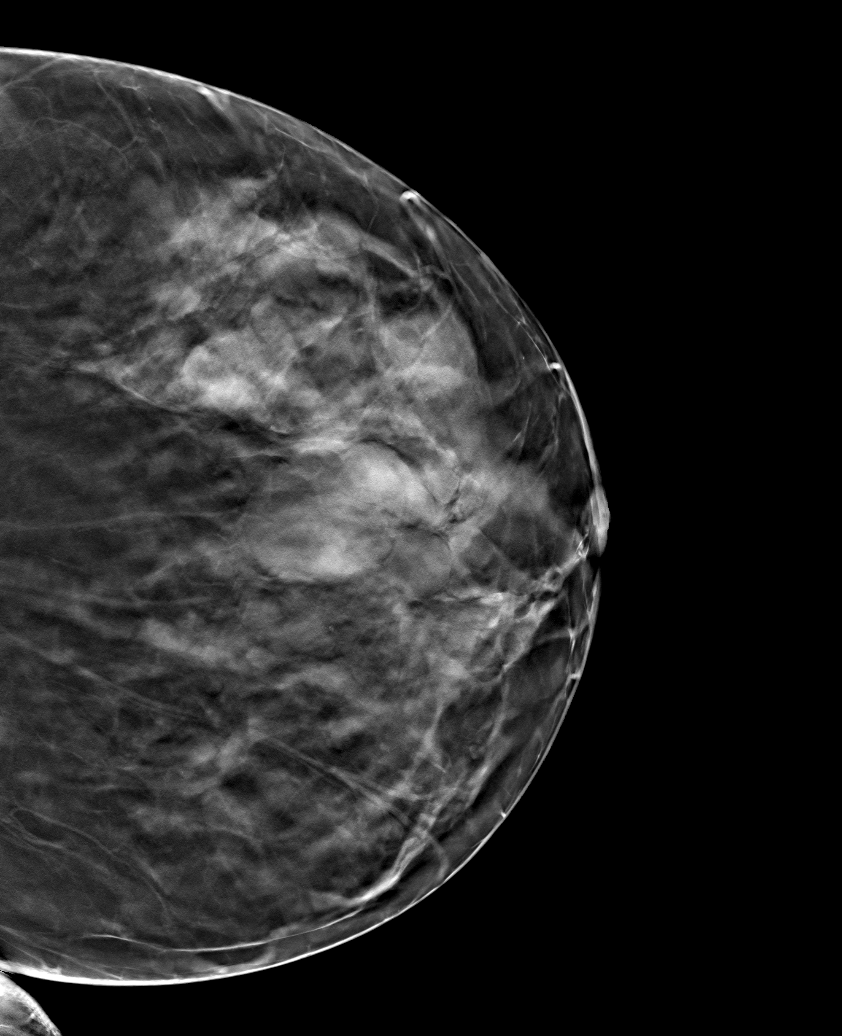

[6 of 30 positions shown; findings below may reference images not displayed]

ACR Breast Density Category c: The breast tissue is heterogeneously
dense, which may obscure small masses.
FINDINGS: Multiple predominantly circumscribed round and oval masses are seen
throughout the bilateral breasts suggestive of cysts are
fibroadenomas. Spot compression MLO tomograms were performed over
the palpable area of concern in the outer left breast with several
oval masses identified, the largest of which measures approximately
3 cm.

Physical examination reveals a mobile mass in the outer left breast.

Targeted ultrasound of the superior left breast was performed
demonstrating innumerable cysts. A cyst corresponding to the site of
palpable concern at the [DATE] position 4 cm from nipple measures 3 x
2.1 x 2.6 cm. No suspicious masses or abnormality seen in the left
breast sonographically.

Targeted ultrasound of the right breast was performed. Numerous
cysts are identified. There is a large cyst at 1 o'clock 5 cm from
nipple measuring 4.3 x 1.7 by 3.4 cm. An additional cyst at 1
o'clock 5 cm from nipple measures 2.5 x 1.3 x 1.6 cm.
IMPRESSION: Bilateral breast cysts.  No findings of malignancy in either breast.

RECOMMENDATION:
Screening mammogram in one year.(Code:[id number]-[id number])

I have discussed the findings and recommendations with the patient.
If applicable, a reminder letter will be sent to the patient
regarding the next appointment.

BI-RADS CATEGORY  2: Benign.

## 2022-08-18 IMAGING — US US BREAST*L* LIMITED INC AXILLA
1 series · 13 of 22 positions shown · non-contrast
Comparison: None.

CLINICAL DATA: 44-year-old female with a palpable area of concern
in the left breast. Remote history fibroadenoma removal from the
right breast.



[Series 1: us breast*left* limited inc axilla · 0.07mm/px · 13 of 22 slices shown]
[im 1/22]
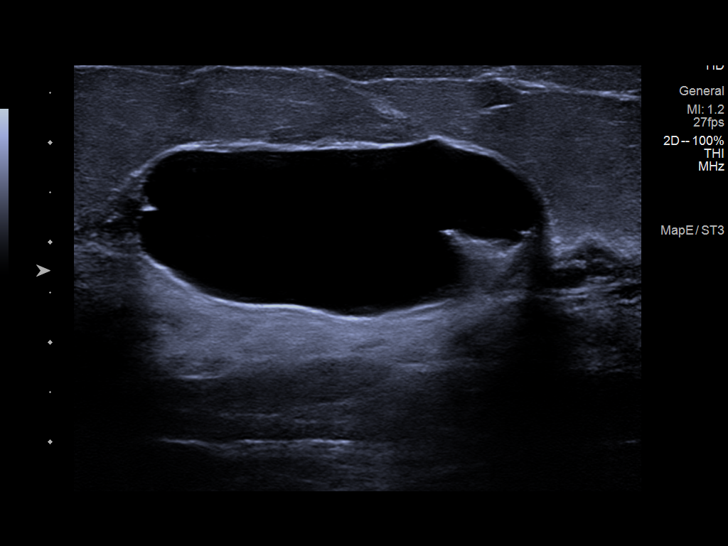
[im 3/22]
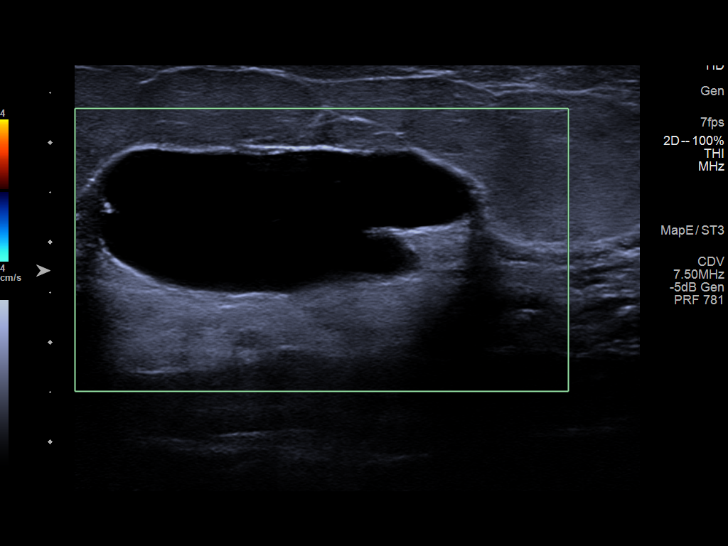
[im 5/22]
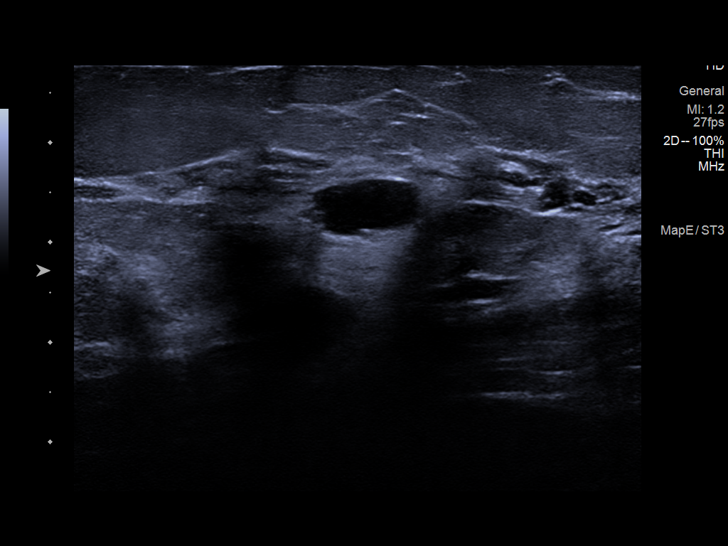
[im 6/22]
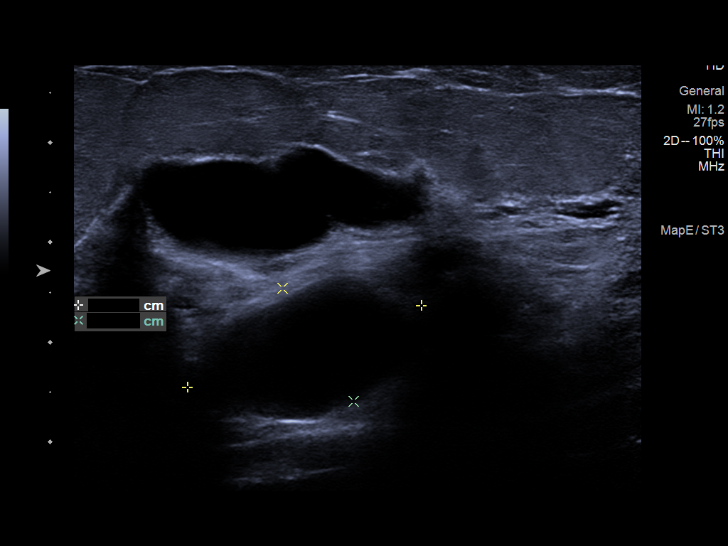
[im 8/22]
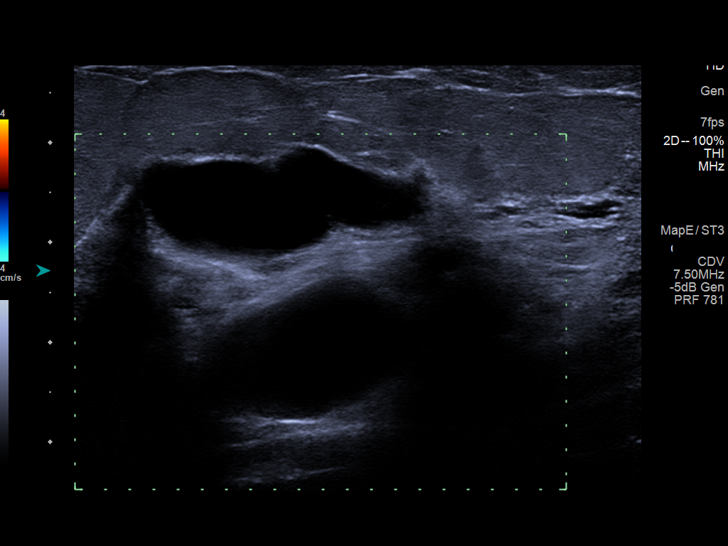
[im 10/22]
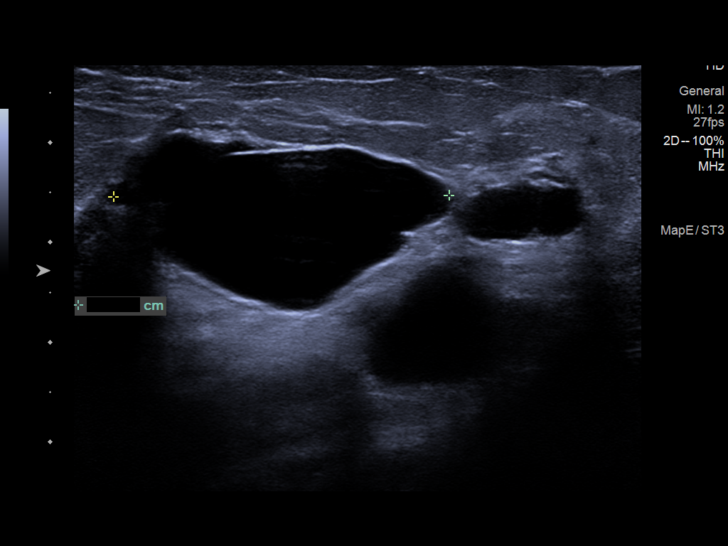
[im 12/22]
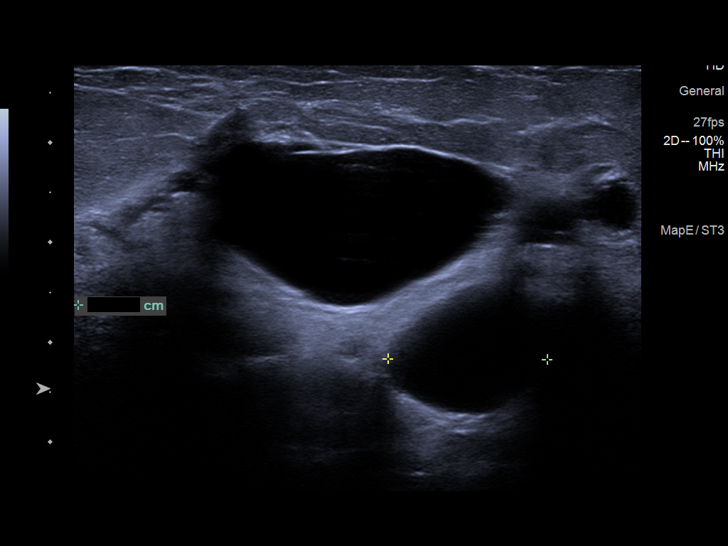
[im 13/22]
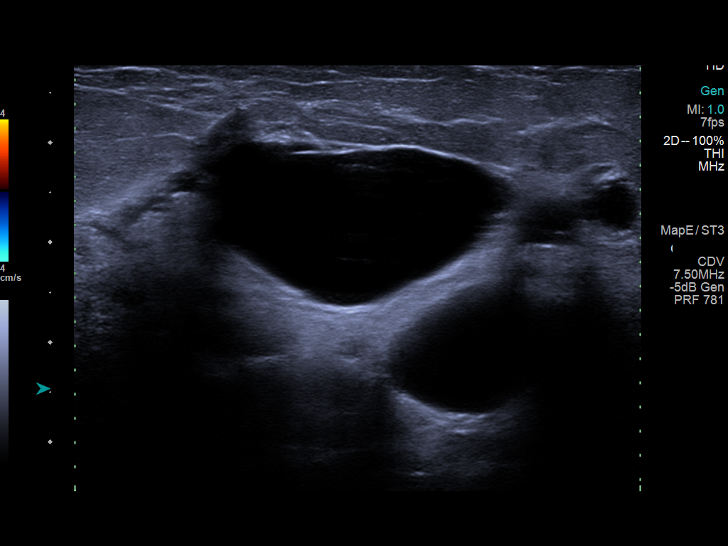
[im 15/22]
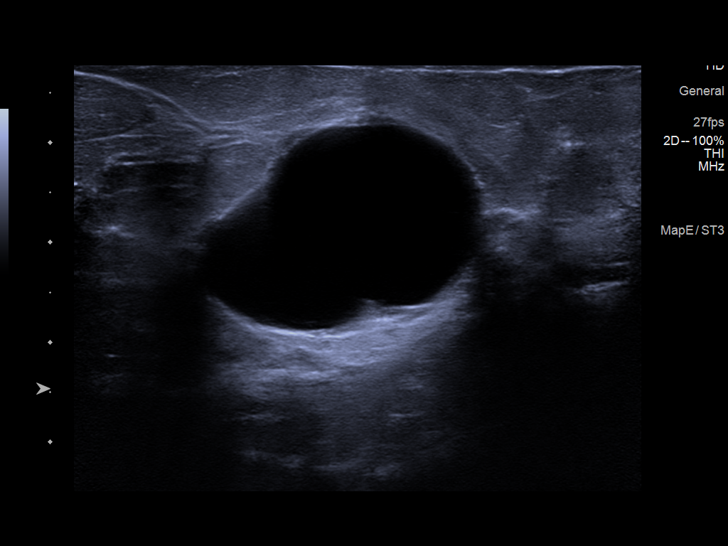
[im 17/22]
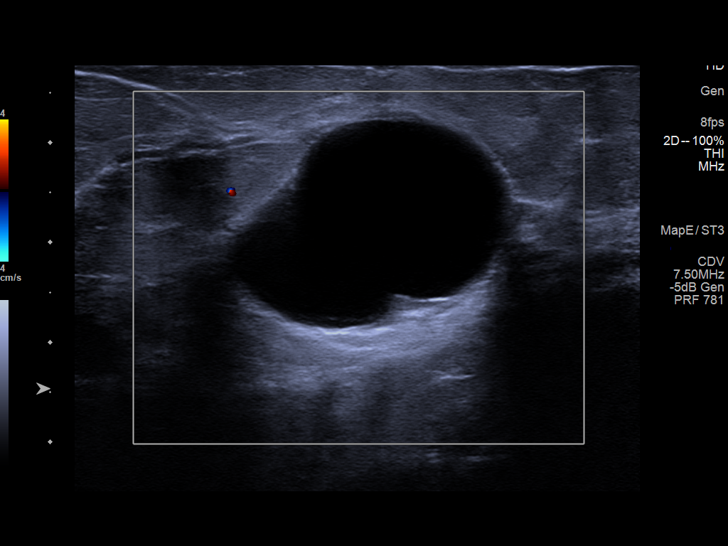
[im 18/22]
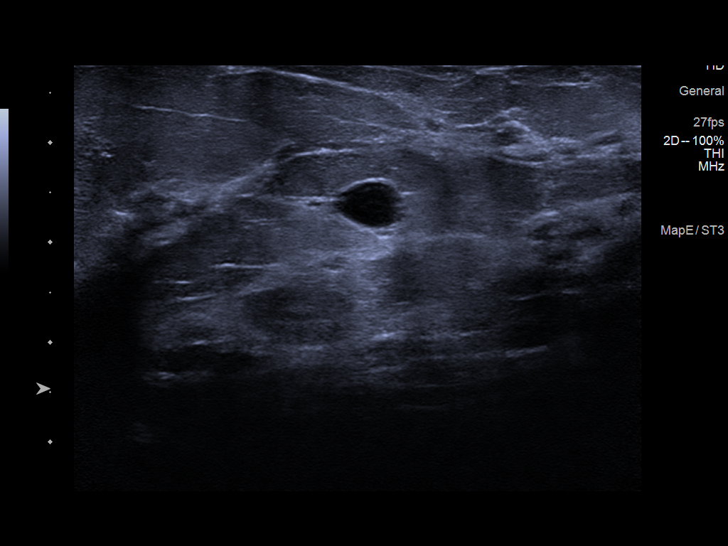
[im 20/22]
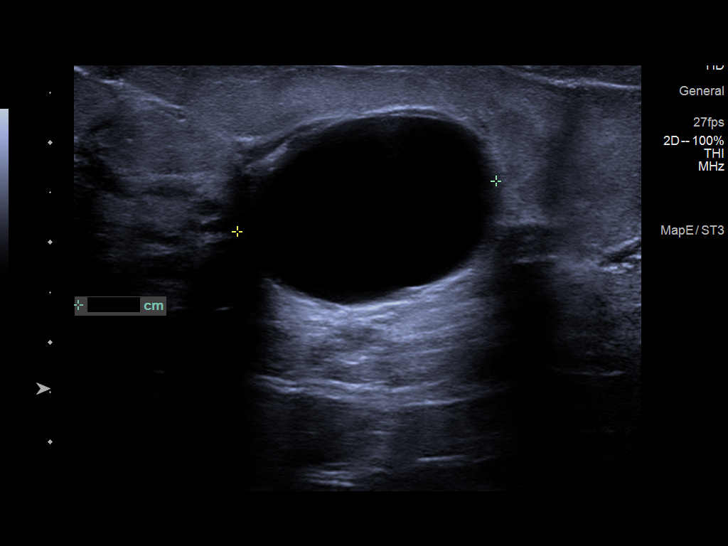
[im 22/22]
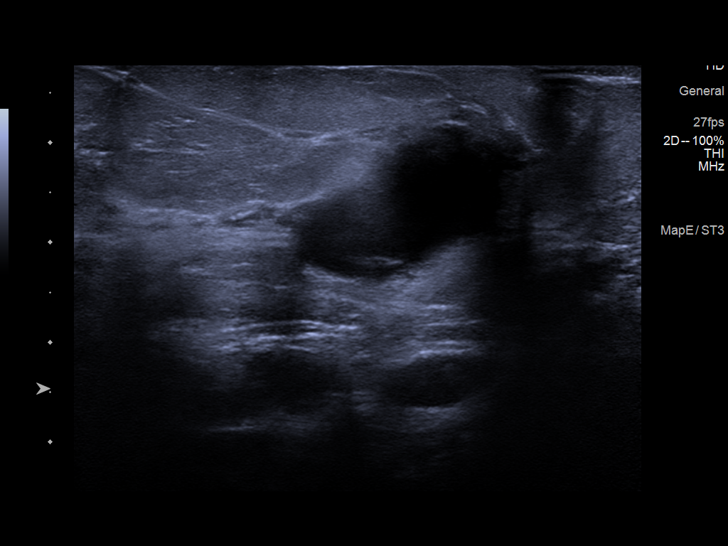

[13 of 22 positions shown; findings below may reference images not displayed]

ACR Breast Density Category c: The breast tissue is heterogeneously
dense, which may obscure small masses.
FINDINGS: Multiple predominantly circumscribed round and oval masses are seen
throughout the bilateral breasts suggestive of cysts are
fibroadenomas. Spot compression MLO tomograms were performed over
the palpable area of concern in the outer left breast with several
oval masses identified, the largest of which measures approximately
3 cm.

Physical examination reveals a mobile mass in the outer left breast.

Targeted ultrasound of the superior left breast was performed
demonstrating innumerable cysts. A cyst corresponding to the site of
palpable concern at the [DATE] position 4 cm from nipple measures 3 x
2.1 x 2.6 cm. No suspicious masses or abnormality seen in the left
breast sonographically.

Targeted ultrasound of the right breast was performed. Numerous
cysts are identified. There is a large cyst at 1 o'clock 5 cm from
nipple measuring 4.3 x 1.7 by 3.4 cm. An additional cyst at 1
o'clock 5 cm from nipple measures 2.5 x 1.3 x 1.6 cm.
IMPRESSION: Bilateral breast cysts.  No findings of malignancy in either breast.

RECOMMENDATION:
Screening mammogram in one year.(Code:[id number])

I have discussed the findings and recommendations with the patient.
If applicable, a reminder letter will be sent to the patient
regarding the next appointment.

BI-RADS CATEGORY  2: Benign.

## 2022-08-29 DIAGNOSIS — F900 Attention-deficit hyperactivity disorder, predominantly inattentive type: Secondary | ICD-10-CM | POA: Insufficient documentation

## 2022-11-07 ENCOUNTER — Other Ambulatory Visit (HOSPITAL_COMMUNITY): Payer: Self-pay | Admitting: Adult Health Nurse Practitioner

## 2022-11-07 DIAGNOSIS — Z1231 Encounter for screening mammogram for malignant neoplasm of breast: Secondary | ICD-10-CM

## 2023-01-11 ENCOUNTER — Encounter (HOSPITAL_COMMUNITY): Payer: Self-pay

## 2023-01-11 ENCOUNTER — Ambulatory Visit (HOSPITAL_COMMUNITY)
Admission: RE | Admit: 2023-01-11 | Discharge: 2023-01-11 | Disposition: A | Discharge status: Home / Self Care | Payer: BC Managed Care – PPO | Source: Ambulatory Visit | Attending: Adult Health Nurse Practitioner | Admitting: Adult Health Nurse Practitioner

## 2023-01-11 DIAGNOSIS — Z1231 Encounter for screening mammogram for malignant neoplasm of breast: Secondary | ICD-10-CM | POA: Insufficient documentation

## 2023-01-18 ENCOUNTER — Other Ambulatory Visit: Payer: Self-pay | Admitting: Medical Genetics

## 2023-01-18 DIAGNOSIS — Z006 Encounter for examination for normal comparison and control in clinical research program: Secondary | ICD-10-CM

## 2023-03-04 DIAGNOSIS — G4709 Other insomnia: Secondary | ICD-10-CM | POA: Insufficient documentation

## 2023-11-18 ENCOUNTER — Ambulatory Visit: Admitting: Physician Assistant

## 2023-11-18 ENCOUNTER — Encounter: Payer: Self-pay | Admitting: Physician Assistant

## 2023-11-18 VITALS — BP 137/86 | HR 99

## 2023-11-18 DIAGNOSIS — L719 Rosacea, unspecified: Secondary | ICD-10-CM

## 2023-11-18 DIAGNOSIS — Z1283 Encounter for screening for malignant neoplasm of skin: Secondary | ICD-10-CM

## 2023-11-18 DIAGNOSIS — D229 Melanocytic nevi, unspecified: Secondary | ICD-10-CM

## 2023-11-18 DIAGNOSIS — Z808 Family history of malignant neoplasm of other organs or systems: Secondary | ICD-10-CM

## 2023-11-18 DIAGNOSIS — W908XXA Exposure to other nonionizing radiation, initial encounter: Secondary | ICD-10-CM

## 2023-11-18 DIAGNOSIS — L814 Other melanin hyperpigmentation: Secondary | ICD-10-CM

## 2023-11-18 DIAGNOSIS — R5382 Chronic fatigue, unspecified: Secondary | ICD-10-CM | POA: Insufficient documentation

## 2023-11-18 DIAGNOSIS — L821 Other seborrheic keratosis: Secondary | ICD-10-CM | POA: Diagnosis not present

## 2023-11-18 DIAGNOSIS — D1801 Hemangioma of skin and subcutaneous tissue: Secondary | ICD-10-CM

## 2023-11-18 DIAGNOSIS — Z86018 Personal history of other benign neoplasm: Secondary | ICD-10-CM

## 2023-11-18 DIAGNOSIS — L578 Other skin changes due to chronic exposure to nonionizing radiation: Secondary | ICD-10-CM

## 2023-11-18 DIAGNOSIS — M2550 Pain in unspecified joint: Secondary | ICD-10-CM | POA: Insufficient documentation

## 2023-11-18 MED ORDER — IVERMECTIN 1 % EX CREA: 1.0000 "application " | TOPICAL_CREAM | Freq: Every day | CUTANEOUS | 6 refills | Status: DC

## 2023-11-18 NOTE — Patient Instructions (Addendum)
 Your prescription was sent to [facility] in [place]. A representative from [facility] will contact you within 3 business hours to verify your address and insurance information to schedule a free delivery. If for any reason you do not receive a phone call from them, please reach out to them. Their phone number is [phone] and their hours are Monday-Friday 9:00 am-5:00 pm.       Important Information   Due to recent changes in healthcare laws, you may see results of your pathology and/or laboratory studies on MyChart before the doctors have had a chance to review them. We understand that in some cases there may be results that are confusing or concerning to you. Please understand that not all results are received at the same time and often the doctors may need to interpret multiple results in order to provide you with the best plan of care or course of treatment. Therefore, we ask that you please give us  2 business days to thoroughly review all your results before contacting the office for clarification. Should we see a critical lab result, you will be contacted sooner.     If You Need Anything After Your Visit   If you have any questions or concerns for your doctor, please call our main line at [phone]. If no one answers, please leave a voicemail as directed and we will return your call as soon as possible. Messages left after 4 pm will be answered the following business day.    You may also send us  a message via MyChart. We typically respond to MyChart messages within 1-2 business days.  For prescription refills, please ask your pharmacy to contact our office. Our fax number is [phone].  If you have an urgent issue when the clinic is closed that cannot wait until the next business day, you can page your doctor at the number below.     Please note that while we do our best to be available for urgent issues outside of office hours, we are not available 24/7.    If you  have an urgent issue and are unable to reach us , you may choose to seek medical care at your doctor's office, retail clinic, urgent care center, or emergency room.   If you have a medical emergency, please immediately call 911 or go to the emergency department. In the event of inclement weather, please call our main line at [phone] for an update on the status of any delays or closures.  Dermatology Medication Tips: Please keep the boxes that topical medications come in in order to help keep track of the instructions about where and how to use these. Pharmacies typically print the medication instructions only on the boxes and not directly on the medication tubes.   If your medication is too expensive, please contact our office at [phone] or send us  a message through MyChart.    We are unable to tell what your co-pay for medications will be in advance as this is different depending on your insurance coverage. However, we may be able to find a substitute medication at lower cost or fill out paperwork to get insurance to cover a needed medication.    If a prior authorization is required to get your medication covered by your insurance company, please allow us  1-2 business days to complete this process.   Drug prices often vary depending on where the prescription is filled and some pharmacies may offer cheaper prices.   The website www.goodrx.com contains coupons for  medications through different pharmacies. The prices here do not account for what the cost may be with help from insurance (it may be cheaper with your insurance), but the website can give you the price if you did not use any insurance.  - You can print the associated coupon and take it with your prescription to the pharmacy.  - You may also stop by our office during regular business hours and pick up a GoodRx coupon card.  - If you need your prescription sent electronically to a different pharmacy, notify our office through [facility] or by phone at [phone]

## 2023-11-18 NOTE — Progress Notes (Signed)
 New Patient Visit   Subjective  Deanna Friedman is a 48 y.o. female who presents for the following:  Total Body Skin Exam (TBSE)   Patient present today for new patient visit for TBSE.The patient reports she has spots, moles and lesions to be evaluated, some may be new or changing and the patient may have concern these could be cancer (Acne like rash on face). Patient has previously been treated by a dermatologist. Patient reports she has hx of bx. Patient reports family history of skin cancers (Dad hx of Melanoma). Patient reports throughout her lifetime has had moderate sun exposure. Currently, patient reports if she has excessive sun exposure, she does apply sunscreen and/or wears protective coverings.   The following portions of the chart were reviewed this encounter and updated as appropriate: medications, allergies, medical history  Review of Systems:  No other skin or systemic complaints except as noted in HPI or Assessment and Plan.    Objective  Well appearing patient in no apparent distress; mood and affect are within normal limits.  A full examination was performed including scalp, head, eyes, ears, nose, lips, neck, chest, axillae, abdomen, back, buttocks, bilateral upper extremities, bilateral lower extremities, hands, feet, fingers, toes, fingernails, and toenails. All findings within normal limits unless otherwise noted below.   Relevant exam findings are noted in the Assessment and Plan.     Assessment & Plan   ROSACEA Exam Mid face erythema with telangiectasias +/- scattered inflammatory papules  Rosacea is a chronic progressive skin condition usually affecting the face of adults, causing redness and/or acne bumps. It is treatable but not curable. It sometimes affects the eyes (ocular rosacea) as well. It may respond to topical and/or systemic medication and can flare with stress, sun exposure, alcohol, exercise, topical steroids (including hydrocortisone/cortisone  10) and some foods.  Daily application of broad spectrum spf 30+ sunscreen to face is recommended to reduce flares  Treatment Plan Soolantra cream Apply to face daily - sent to [facility]    LENTIGINES, SEBORRHEIC KERATOSES, HEMANGIOMAS - Benign normal skin lesions - Benign-appearing - Call for any changes  MELANOCYTIC NEVI - Tan-brown and/or pink-flesh-colored symmetric macules and papules - Benign appearing on exam today - Observation - Call clinic for new or changing moles - Recommend daily use of broad spectrum spf 30+ sunscreen to sun-exposed areas.    ACTINIC DAMAGE - Chronic condition, secondary to cumulative UV/sun exposure - diffuse scaly erythematous macules with underlying dyspigmentation - Recommend daily broad spectrum sunscreen SPF 30+ to sun-exposed areas, reapply every 2 hours as needed.  - Staying in the shade or wearing long sleeves, sun glasses (UVA+UVB protection) and wide brim hats (4-inch brim around the entire circumference of the hat) are also recommended for sun protection.  - Call for new or changing lesions.  History of Dysplastic Nevi Left Posterior Shoulder - No evidence of recurrence today - Recommend regular full body skin exams - Recommend daily broad spectrum sunscreen SPF 30+ to sun-exposed areas, reapply every 2 hours as needed.  - Call if any new or changing lesions are noted between office visits  SKIN CANCER SCREENING PERFORMED TODAY ROSACEA   LENTIGINES   SEBORRHEIC KERATOSIS   CHERRY ANGIOMA   MULTIPLE BENIGN NEVI   ACTINIC SKIN DAMAGE   HISTORY OF DYSPLASTIC NEVUS   SKIN CANCER SCREENING    Return in 4 months (on 03/19/2024) for Rosacea follow up.  I, Roseline Hutchinson, CMA, am acting as scribe for Nahome Bublitz K, PA-C .  Documentation: I have reviewed the above documentation for accuracy and completeness, and I agree with the above.  Raja Caputi K, PA-C

## 2024-03-09 ENCOUNTER — Institutional Professional Consult (permissible substitution): Admitting: Plastic Surgery

## 2024-03-10 ENCOUNTER — Other Ambulatory Visit: Payer: Self-pay | Admitting: Medical Genetics

## 2024-03-10 ENCOUNTER — Ambulatory Visit

## 2024-03-10 ENCOUNTER — Telehealth: Payer: Self-pay

## 2024-03-10 VITALS — BP 123/87 | HR 88 | Ht 64.0 in | Wt 187.4 lb

## 2024-03-10 DIAGNOSIS — M546 Pain in thoracic spine: Secondary | ICD-10-CM

## 2024-03-10 DIAGNOSIS — N62 Hypertrophy of breast: Secondary | ICD-10-CM

## 2024-03-10 DIAGNOSIS — Z006 Encounter for examination for normal comparison and control in clinical research program: Secondary | ICD-10-CM

## 2024-03-10 DIAGNOSIS — M542 Cervicalgia: Secondary | ICD-10-CM | POA: Diagnosis not present

## 2024-03-10 DIAGNOSIS — G8929 Other chronic pain: Secondary | ICD-10-CM

## 2024-03-10 NOTE — Telephone Encounter (Signed)
 Faxed Surgical Clearance form to Charmaine Heller, NP [phone]) with confirmed receipt.

## 2024-03-10 NOTE — Progress Notes (Signed)
 BREAST REDUCTION CONSULT Plastic & Reconstructive Surgery New Patient Visit  Patient: Deanna Friedman MRN: [id number] Date: 03/10/2024 Referring Physician: Suanne Pfeiffer, NP  Chief Complaint: Symptomatic macromastia, cervicalgia  History of Present Illness:  This is a 48 y.o. woman with PMH and PSH as described below who presents in consultation for breast reduction.   The patient states she has been considering a breast reduction for years. She describes intermittent skin irritation in the breast folds and occasional rashes.   Back pain in the upper and lower back, including neck pain. She pulls or pins her bra straps to provide better lift and relief of the pressure and pain. She notices relief by holding her breast up manually.  Her shoulder straps cause grooves and pain and pressure that requires padding for relief. Pain medication is sometimes required with motrin  and tylenol .  Activities that are hindered by enlarged breasts include: exercise and running.  She has tried supportive clothing as well as fitted bras without improvement.  She currently wears a F cup bra and would ideally like to be a C cup.   She has a personal history of fibroadenoma resection on the right breast.  Her last mammogram was done in 2024. She is due for one.   She no recent weight changes. Of note, she has breastfed children and is done child bearing.   Past Medical History: Past Medical History:  Diagnosis Date   ADHD (attention deficit hyperactivity disorder)    Allergy    food, meds, mold, dust   Anemia    Asthma    Blood transfusion without reported diagnosis    childbirth   Body aches 11/29/2014   BV (bacterial vaginosis) 10/14/2013   Complication of anesthesia    'I wake up combative.   Contraceptive management 03/31/2014   Decreased sex drive 91/88/7983   Fibromyalgia    Folliculitis 01/27/2015   Heart murmur    HTN (hypertension)    Hypercholesteremia 10/09/2016   Migraines     Multiple allergies    Obesity 10/14/2013   Raynaud's disease    Vaginal itching 12/30/2014    Past Surgical History: Past Surgical History:  Procedure Laterality Date   ANTERIOR CRUCIATE LIGAMENT REPAIR Left 01/29/2017   BREAST EXCISIONAL BIOPSY Right    fibroadenoma   DILATION AND CURETTAGE OF UTERUS  2011   DILITATION & CURRETTAGE/HYSTROSCOPY WITH NOVASURE ABLATION N/A 05/07/2017   Procedure: DILATATION & CURETTAGE/HYSTEROSCOPY;  Surgeon: Edsel Norleen GAILS, MD;  Location: [facility];  Service: Gynecology;  Laterality: N/A;   KNEE ARTHROSCOPY Bilateral    LAPAROSCOPIC BILATERAL SALPINGECTOMY Bilateral 05/07/2017   Procedure: LAPAROSCOPIC BILATERAL SALPINGECTOMY;  Surgeon: Edsel Norleen GAILS, MD;  Location: [facility];  Service: Gynecology;  Laterality: Bilateral;   UMBILICAL HERNIA REPAIR N/A 05/07/2017   Procedure: HERNIA REPAIR UMBILICAL ADULT;  Surgeon: Edsel Norleen GAILS, MD;  Location: [facility];  Service: Gynecology;  Laterality: N/A;   VAGINAL HYSTERECTOMY N/A 05/02/2022   Procedure: HYSTERECTOMY VAGINAL, total;  Surgeon: Jayne Vonn VEAR, MD;  Location: [facility];  Service: Gynecology;  Laterality: N/A;   Current Medications: Current Outpatient Medications on File Prior to Visit  Medication Sig Dispense Refill   albuterol  (PROVENTIL  HFA;VENTOLIN  HFA) 108 (90 BASE) MCG/ACT inhaler Inhale 2 puffs into the lungs every 6 (six) hours as needed for wheezing or shortness of breath. 1 Inhaler 0   amphetamine-dextroamphetamine (ADDERALL XR) 30 MG 24 hr capsule Take 30 mg by mouth every morning.     ASHWAGANDHA PO Take 300  mg by mouth in the morning and at bedtime.     Bioflavonoid Products (VITAMIN C) CHEW Chew 1 each by mouth daily. 1000 mg     Cholecalciferol (VITAMIN D3) 125 MCG (5000 UT) TABS Take 5,000 Units by mouth daily.     diazepam (VALIUM) 10 MG tablet Take 5 mg by mouth at bedtime as needed (sleep).     diphenhydrAMINE (BENADRYL) 25 MG tablet Take 25 mg by mouth every 6 (six) hours as needed  for allergies.     hydrochlorothiazide  (MICROZIDE ) 12.5 MG capsule TAKE 1 CAPSULE(12.5 MG) BY MOUTH DAILY 30 capsule 12   HYDROcodone -acetaminophen  (NORCO/VICODIN) 5-325 MG tablet Take 1 tablet by mouth every 6 (six) hours as needed. 28 tablet 0   Ivermectin  (SOOLANTRA ) 1 % CREA Apply 1 application  topically daily. Apply to face 45 g 6   levocetirizine (XYZAL) 5 MG tablet Take 5 mg by mouth every evening.     losartan  (COZAAR ) 50 MG tablet Take 50 mg by mouth daily.     Magnesium 250 MG TABS Take 250 mg by mouth in the morning and at bedtime.     Melatonin 5 MG CAPS Take 5 mg by mouth at bedtime as needed (sleep).     methocarbamol (ROBAXIN) 750 MG tablet Take 750 mg by mouth at bedtime.     mometasone  (ELOCON ) 0.1 % cream Apply 1 application topically daily. (Patient taking differently: Apply 1 application  topically daily as needed (hives).) 45 g 1   montelukast  (SINGULAIR ) 10 MG tablet Take 1 tablet (10 mg total) by mouth at bedtime. 30 tablet 3   Nutritional Supplements (ADRENAL COMPLEX PO) Take 1 capsule by mouth daily. Adrenal Health     ondansetron  (ZOFRAN -ODT) 8 MG disintegrating tablet Take 1 tablet (8 mg total) by mouth every 8 (eight) hours as needed for nausea or vomiting. 8 tablet 0   Potassium Gluconate 550 (90 K) MG TABS Take 550 mg by mouth daily.     pregabalin (LYRICA) 100 MG capsule Take 100 mg by mouth 3 (three) times daily.     Turmeric (QC TUMERIC COMPLEX PO) Take 1 capsule by mouth daily.     Current Facility-Administered Medications on File Prior to Visit  Medication Dose Route Frequency Provider Last Rate Last Admin   sterile water  for irrigation for irrigation    PRN Jayne Vonn DEL, MD   1,000 mL at 05/02/22 1215   Allergies: Allergies  Allergen Reactions   Cherry Hives   Nutritional Supplements Anaphylaxis    Pitted fruit, highly reactive to cherries 05/07/17 tolerated Propofol    Shellfish Allergy Hives   Penicillins Other (See Comments)    Ineffective.    Other Hives    Pitted Fruits   Aloe Hives   Cephalexin Other (See Comments)    Severe headache   Coconut Fatty Acid Hives    Coconut eaten   Dust Mite Extract Hives   Food Hives    Tree nuts   Percodan [Oxycodone-Aspirin] Other (See Comments)    Serious headache    Family History:  History of breast cancer in family negative. Family history is negative for bleeding/clotting disorders, problems with anesthesia, connective tissue disorders.   Social History:  Social History   Socioeconomic History   Marital status: Married    Spouse name: Warren   Number of children: 3   Years of education: 16   Highest education level: Not on file  Occupational History   Occupation: Runner, broadcasting/film/video    Comment:  [place]  Tobacco Use   Smoking status: Former    Current packs/day: 0.00    Average packs/day: 0.3 packs/day for 5.0 years (1.3 ttl pk-yrs)    Types: Cigarettes    Start date: 05/02/1994    Quit date: 05/03/1999    Years since quitting: 24.8   Smokeless tobacco: Never  Vaping Use   Vaping status: Never Used  Substance and Sexual Activity   Alcohol use: Yes    Comment: social   Drug use: No   Sexual activity: Yes    Birth control/protection: Surgical    Comment: tubal  Other Topics Concern   Not on file  Social History Narrative   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   Lives with Jamie   Yoga/exercises.    Children, 3 biological.    Social Drivers of Health   Financial Resource Strain: Low Risk  (12/02/2023)   Received from [facility]   Overall Financial Resource Strain (CARDIA)    How hard is it for you to pay for the very basics like food, housing, medical care, and heating?: Not hard at all  Food Insecurity: No Food Insecurity (12/02/2023)   Received from [facility]   Hunger Vital Sign    Within the past 12 months, you worried that your food would run out before you got the money to buy more.: Never  true    Within the past 12 months, the food you bought just didn't last and you didn't have money to get more.: Never true  Transportation Needs: No Transportation Needs (12/02/2023)   Received from [facility] - Transportation    In the past 12 months, has lack of transportation kept you from medical appointments or from getting medications?: No    In the past 12 months, has lack of transportation kept you from meetings, work, or from getting things needed for daily living?: No  Physical Activity: Insufficiently Active (12/02/2023)   Received from [facility]   Exercise Vital Sign    On average, how many days per week do you engage in moderate to strenuous exercise (like a brisk walk)?: 1 day    On average, how many minutes do you engage in exercise at this level?: 20 min  Stress: No Stress Concern Present (12/02/2023)   Received from [facility] - Occupational Stress Questionnaire    Do you feel stress - tense, restless, nervous, or anxious, or unable to sleep at night because your mind is troubled all the time - these days?: Only a little  Social Connections: Socially Integrated (12/02/2023)   Received from [facility]   Social Network    How would you rate your social network (family, work, friends)?: Good participation with social networks   Smoker: Former Recreational drug use: Denies  Review of systems: 10 point review of systems performed and negative except as noted in the HPI  Physical Exam: BP 123/87 (BP Location: Left Arm, Patient Position: Sitting, Cuff Size: Large)   Pulse 88   Ht 5' 4 (1.626 m)   Wt 188 lb 6.4 oz (85.5 kg)   LMP 04/21/2022 (Within Weeks)   SpO2 97%   BMI 32.34 kg/m  Body mass index is 32.34 kg/m. Estimated body surface area is 1.96 meters squared as calculated from the following:   Height as of this encounter: 5' 4 (1.626 m).   Weight as of this  encounter: 188 lb 6.4 oz (85.5  kg).  Physical Exam           General: Well appearing, no apparent distress. Chest: Chest wall without abnormality or obvious deformity. No scoliosis, pectus excavatum or pectus carinatum. Breast: Bilateral macromastia. Grade 3 ptosis bilaterally. Breast parenchyma is fatty. There are no palpable breast masses in either breast. Bilateral NAC viable and sensate. Papules everted without discharge. NACs positioned along breast meridian bilaterally. Skin quality is poor. There are no skin lesions, scars or dimpling.  - Breast measurements:  Measurements  Right Breast (cm)  Left Breast (cm)  SN-Nipple 34 36  Base Width 18 18.5  Nipple - IMF 14 16  NAC diameter 5*6 6*7  Internipple Distance 29  Neuro: Moving all four extremities spontaneously.  Psych: Appropriate mood and affect.   Pertinent Labs: No results found for this or any previous visit (from the past 2160 hours).  Pertinent Imaging:  Assessment: In summary, this is a pleasant 48 y.o. year-old woman presenting for consultation for bilateral breast reduction in setting of symptomatic macromastia.   After evaluation today, I believe the patient would be an appropriate candidate for the operation. Based on her Schnur scale, she would require a 870 g reduction per side. I think a 870 g resection would be attainable through a reduction mammoplasty. We discussed the operation in detail including the potential incision patterns (e.g., vertical only versus wise pattern), and possible need for surgical drains. Based on her clinical exam, she will likely require a wise pattern with inferior pedicle.   We discussed the risks of this procedure which include but are not limited to: bleeding, infection, seroma, delayed wound healing, wound dehiscence, asymmetries, fat necrosis, hypertrophic and keloid scarring, decreased or loss of nipple sensation, partial or full loss of the NAC, numbness, paresthesias, injuries to arteries/nerves/veins, need for  revision procedures, further out-of-pocket expenses for ongoing medical care, and potential need for repeat reduction in the future. We further discussed the risk of DVT/PE, fat embolism, heart attack, stroke, death as well as the risks of anesthesia. We reviewed the expected recovery period with no heavy activities or lifting >5lbs for 6 weeks postoperatively.   The patient voiced understanding and wishes to proceed. All questions and concerns were addressed to the patient's apparent satisfaction.   Plan: - Plan for bilateral reduction mammaplasty with liposuction - 4 hours under general anesthesia. - Will submit for pre-determination with insurance. - Scheduling pending insurance. - Needs PCP and Anesthesia clearance. - Needs mammogram (ordered) - Needs PT x 6 sessions for insurance authorization.   The sensitive parts of the examination/procedure were performed with MA as chaperone.  The time documented represents the total time spent on the day of the encounter in preparing for and completing the visit. It does not include time spent by ancillary staff, a resident, a fellow, another trainee, or, for shared visits, time spent jointly with the patient or discussing the case or the performance of other separately performed services.  Time spent: 45 minutes.     Jakoby Melendrez, MD [facility]  03/10/2024 9:24 AM

## 2024-03-11 NOTE — Telephone Encounter (Signed)
 Received surgical clearance form clearing pt for surgery.

## 2024-03-12 NOTE — Addendum Note (Signed)
 Addended by: EUSTACIO POUR on: 03/12/2024 07:38 AM   Modules accepted: Orders

## 2024-03-16 ENCOUNTER — Ambulatory Visit: Admitting: Physician Assistant

## 2024-03-16 ENCOUNTER — Ambulatory Visit (HOSPITAL_COMMUNITY)
Admission: RE | Admit: 2024-03-16 | Discharge: 2024-03-16 | Disposition: A | Discharge status: Home / Self Care | Source: Ambulatory Visit

## 2024-03-16 DIAGNOSIS — N62 Hypertrophy of breast: Secondary | ICD-10-CM | POA: Diagnosis present

## 2024-03-16 DIAGNOSIS — Z1231 Encounter for screening mammogram for malignant neoplasm of breast: Secondary | ICD-10-CM | POA: Diagnosis not present

## 2024-03-16 DIAGNOSIS — G8929 Other chronic pain: Secondary | ICD-10-CM | POA: Diagnosis not present

## 2024-03-16 DIAGNOSIS — M546 Pain in thoracic spine: Secondary | ICD-10-CM | POA: Insufficient documentation

## 2024-03-16 DIAGNOSIS — M542 Cervicalgia: Secondary | ICD-10-CM | POA: Diagnosis present

## 2024-03-19 ENCOUNTER — Encounter: Payer: Self-pay | Admitting: Physical Therapy

## 2024-03-19 ENCOUNTER — Ambulatory Visit: Admitting: Physical Therapy

## 2024-03-19 ENCOUNTER — Other Ambulatory Visit: Payer: Self-pay

## 2024-03-19 DIAGNOSIS — R208 Other disturbances of skin sensation: Secondary | ICD-10-CM | POA: Insufficient documentation

## 2024-03-19 DIAGNOSIS — M6281 Muscle weakness (generalized): Secondary | ICD-10-CM | POA: Diagnosis present

## 2024-03-19 DIAGNOSIS — M542 Cervicalgia: Secondary | ICD-10-CM | POA: Insufficient documentation

## 2024-03-19 DIAGNOSIS — M546 Pain in thoracic spine: Secondary | ICD-10-CM | POA: Insufficient documentation

## 2024-03-19 DIAGNOSIS — G8929 Other chronic pain: Secondary | ICD-10-CM | POA: Insufficient documentation

## 2024-03-19 DIAGNOSIS — R293 Abnormal posture: Secondary | ICD-10-CM | POA: Insufficient documentation

## 2024-03-19 DIAGNOSIS — N62 Hypertrophy of breast: Secondary | ICD-10-CM | POA: Insufficient documentation

## 2024-03-19 NOTE — Therapy (Unsigned)
 OUTPATIENT PHYSICAL THERAPY CERVICAL EVALUATION   Patient Name: Deanna Friedman MRN: [id number] DOB:07/03/1975, 48 y.o., female Today's Date: 03/20/2024  END OF SESSION:  PT End of Session - 03/20/24 0816     Visit Number 1    Number of Visits 1    Authorization Type [employer]    PT Start Time 1518    PT Stop Time 1556    PT Time Calculation (min) 38 min    Activity Tolerance Patient limited by pain    Behavior During Therapy [facility] for tasks assessed/performed          Past Medical History:  Diagnosis Date   ADHD (attention deficit hyperactivity disorder)    Allergy    food, meds, mold, dust   Anemia    Asthma    Blood transfusion without reported diagnosis    childbirth   Body aches 11/29/2014   BV (bacterial vaginosis) 10/14/2013   Complication of anesthesia    'I wake up combative.   Contraceptive management 03/31/2014   Decreased sex drive 91/88/7983   Fibromyalgia    Folliculitis 01/27/2015   Heart murmur    HTN (hypertension)    Hypercholesteremia 10/09/2016   Migraines    Multiple allergies    Obesity 10/14/2013   Raynaud's disease    Vaginal itching 12/30/2014   Past Surgical History:  Procedure Laterality Date   ANTERIOR CRUCIATE LIGAMENT REPAIR Left 01/29/2017   BREAST EXCISIONAL BIOPSY Right    fibroadenoma   DILATION AND CURETTAGE OF UTERUS  2011   DILITATION & CURRETTAGE/HYSTROSCOPY WITH NOVASURE ABLATION N/A 05/07/2017   Procedure: DILATATION & CURETTAGE/HYSTEROSCOPY;  Surgeon: Edsel Norleen GAILS, MD;  Location: [facility];  Service: Gynecology;  Laterality: N/A;   KNEE ARTHROSCOPY Bilateral    LAPAROSCOPIC BILATERAL SALPINGECTOMY Bilateral 05/07/2017   Procedure: LAPAROSCOPIC BILATERAL SALPINGECTOMY;  Surgeon: Edsel Norleen GAILS, MD;  Location: [facility];  Service: Gynecology;  Laterality: Bilateral;   UMBILICAL HERNIA REPAIR N/A 05/07/2017   Procedure: HERNIA REPAIR UMBILICAL ADULT;  Surgeon: Edsel Norleen GAILS, MD;  Location: [facility];  Service: Gynecology;   Laterality: N/A;   VAGINAL HYSTERECTOMY N/A 05/02/2022   Procedure: HYSTERECTOMY VAGINAL, total;  Surgeon: Jayne Vonn VEAR, MD;  Location: [facility];  Service: Gynecology;  Laterality: N/A;   Patient Active Problem List   Diagnosis Date Noted   Polyarthralgia 11/18/2023   Chronic fatigue 11/18/2023   Other insomnia 03/04/2023   Attention deficit hyperactivity disorder (ADHD), predominantly inattentive type 08/29/2022   Abnormal uterine bleeding (AUB) 05/02/2022   Dysmenorrhea 05/02/2022   Dyspareunia in female 05/02/2022   Uterine prolapse 05/02/2022   Intractable migraine without aura and without status migrainosus 11/24/2021   Arthralgia of left knee 08/17/2021   Rectocele 07/18/2021   Cystocele with first degree uterine prolapse 07/18/2021   Menorrhagia with irregular cycle 07/18/2021   Fibromyalgia 11/23/2020   Irregular periods 08/24/2019   Perimenopause 08/24/2019   Yeast infection 02/05/2018   Vaginal itching 02/05/2018   BV (bacterial vaginosis) 02/05/2018   Vaginal discharge 02/05/2018   Hypercholesteremia 10/09/2016   Intermittent asthma without complication 09/11/2016   Well woman exam with routine gynecological exam 01/13/2016   Body aches 11/29/2014   Contraceptive management 03/31/2014   Migraines    HTN (hypertension)    Multiple allergies     PCP: Suanne Pfeiffer NP   REFERRING PROVIDER: Montorfano, Lisandro M, MD  REFERRING DIAG:  Diagnosis  M54.2 (ICD-10-CM) - Bilateral neck pain  M54.6,G89.29 (ICD-10-CM) - Chronic bilateral thoracic back pain  N62 (ICD-10-CM) - Macromastia    THERAPY DIAG:  Cervicalgia  Pain in thoracic spine  Muscle weakness (generalized)  Abnormal posture  Other disturbances of skin sensation  Rationale for Evaluation and Treatment: Rehabilitation  ONSET DATE: chronic   SUBJECTIVE:                                                                                                                                                                                                          SUBJECTIVE STATEMENT:  Have been having this pain for quite some time- tried to get a breast reduction 18-20 years in the past but was told to start a chronic pain medication regimen, didn't want to do that as I was in good health at the time. Have dense breast tissue from mammograms, losing weight has not helped reduce breast size. Having a lot of back pain, muscle spasms, numbness in arms and hands especially with repetitive household duties like folding laundry. Massage helps temporarily but symptoms tend to return the next day including numbness in hands. 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 Having a hard time driving due to hand numbness at this point.     Hand dominance: Right  PERTINENT HISTORY:   See above   PAIN:  Are you having pain? Yes: NPRS scale: 12/10 Pain location: both sides of neck/back  Pain description: hand numbness, tingling in UEs, combo of sharb/stabbing/dull/aching in neck and back  Aggravating factors: pretty much anything  Relieving factors: heat, massage   PRECAUTIONS: None  RED FLAGS: None     WEIGHT BEARING RESTRICTIONS: No  FALLS:  Has patient fallen in last 6 months? No  LIVING ENVIRONMENT: Lives with: lives with their family Lives in: House/apartment   OCCUPATION: teacher in mental health setting   PLOF: Independent, Independent with basic ADLs, Independent with gait, and Independent with transfers  PATIENT GOALS: address pain, have a more functional life   NEXT MD VISIT: referring to be scheduled after PT eval   OBJECTIVE:  Note: Objective measures were completed at Evaluation unless otherwise noted.    COGNITION: Overall cognitive status: Within functional limits for tasks assessed  SENSATION: Impaired light touch sensation in B hands and UEs   POSTURE: rounded shoulders, forward head, and  increased thoracic kyphosis  PALPATION: Very tender B thoracic paraspinals even to light touch   CERVICAL ROM:   Active ROM A/PROM (deg) eval  Flexion WNL   Extension 25% limited   Right lateral flexion 50% limited  Left lateral flexion  50% limited   Right rotation 25% limited   Left rotation 25% limited    (Blank rows = not tested)  Thoracic AROM; flexion 50% limited, extension 75% limited, lateral flexion 80% limited B painful, rotation 50% limited     UPPER EXTREMITY MMT:  MMT Right eval Left eval  Shoulder flexion 4 4  Shoulder extension    Shoulder abduction 3+ 3+  Shoulder adduction    Shoulder extension    Shoulder internal rotation    Shoulder external rotation    Middle trapezius    Lower trapezius    Elbow flexion    Elbow extension    Wrist flexion    Wrist extension    Wrist ulnar deviation    Wrist radial deviation    Wrist pronation    Wrist supination    Grip strength     (Blank rows = not tested)    TREATMENT DATE:   03/19/24  Eval, POC, HEP          Education as below  HEP creation/discussion/practice as warranted                                                                                                                            PATIENT EDUCATION:  Education details: eval findings, POC, HEP, limited role of PT in addressing severity and chronicity of sx and formal PT recommendation to proceed with surgical procedure due to likely limited effect of skilled PT services on severe chronic symptoms.  Person educated: Patient Education method: Explanation, Demonstration, and Handouts Education comprehension: verbalized understanding, returned demonstration, and needs further education  HOME EXERCISE PROGRAM: Access Code: [email or web address] URL: [email or web address] Date: 03/19/2024 Prepared by: Josette Rough  Exercises - Scapular Retraction with Resistance  - 1 x daily - 7 x weekly - 2 sets - 10 reps - Shoulder extension  with resistance - Neutral  - 1 x daily - 7 x weekly - 2 sets - 10 reps - Seated Backward Shoulder Rolls  - 1 x daily - 7 x weekly - 2 sets - 10 reps - Seated Cervical Retraction  - 1 x daily - 7 x weekly - 2 sets - 10 reps  ASSESSMENT:  CLINICAL IMPRESSION: Patient is a 48 y.o. F who was seen today for physical therapy evaluation and treatment for  Diagnosis  M54.2 (ICD-10-CM) - Bilateral neck pain  M54.6,G89.29 (ICD-10-CM) - Chronic bilateral thoracic back pain  N62 (ICD-10-CM) - Macromastia  .  She has been dealing with chronic back and neck pain including significant radicular symptoms that negatively impact ADLs and QOL daily. She has tried multiple courses of conservative management strategies for her symptoms, including weight loss, chiropractic interventions, massage, and exercise programming without meaningful effect or significant outcome. At this time do not recommend skilled PT services as I do not believe that we will be able to impact her pain and symptoms in a meaningful way. Assigned some basic exercises to attempt to address  pain until she is able to get appropriate surgical intervention.   OBJECTIVE IMPAIRMENTS: decreased ROM, decreased strength, hypomobility, increased fascial restrictions, increased muscle spasms, impaired sensation, impaired UE functional use, improper body mechanics, postural dysfunction, obesity, and pain.   ACTIVITY LIMITATIONS: carrying, lifting, bending, sleeping, bathing, toileting, dressing, reach over head, hygiene/grooming, and caring for others  PARTICIPATION LIMITATIONS: meal prep, cleaning, laundry, driving, shopping, community activity, occupation, and yard work  PERSONAL FACTORS: Fitness, Past/current experiences, Profession, and Time since onset of injury/illness/exacerbation are also affecting patient's functional outcome.   REHAB POTENTIAL: Poor has tried and not had success with multiple conservative management techniques repeatedly    CLINICAL DECISION MAKING: Stable/uncomplicated  EVALUATION COMPLEXITY: Low   GOALS: Goals reviewed with patient? No  SHORT TERM GOALS: Target date: 03/19/24   Will be compliant with appropriate trial HEP   Baseline:  Goal status: INITIAL      LONG TERM GOALS: Target date: N/A      PLAN:  PT FREQUENCY: one time visit  PT DURATION: 1 sessions  PLANNED INTERVENTIONS: 97110-Therapeutic exercises, 97530- Therapeutic activity, 97112- Neuromuscular re-education, 97535- Self Care, 02859- Manual therapy, and Patient/Family education  PLAN FOR NEXT SESSION: eval only, return to MD    Josette Rough, PT, DPT 03/20/24 8:17 AM    Josette Rough , PT,DPT Physical Therapist [address] [place], [place] [address] [phone]   To whom it may Concern:  I evaluated and treated Deanna Friedman DOB 12-27-1975, for bilateral neck pain, bilateral thoracic back pain, and macromastia. She received Physical therapy and was provided home exercise program. She has overall abnormal posture due to increased breast tissue likely causing her back and neck pain leading to decreased Range of motion, decreased  strength, multiple areas of severe trigger points and muscle spasms, reduced functional work tolerance, progressive impairments in sensation and functional UE use, and decreased functional abilities. She would benefit from breast reduction surgery to address her postural dysfunction and improve overall pain and quality of life. She has other recommendations by her doctors for this procedure as well. Please let me know if you have any additional questions or concerns.   Sincerely, Josette Rough , PT,DPT Physical Therapist [address] [place], [place] [address] [phone]

## 2024-05-06 ENCOUNTER — Ambulatory Visit

## 2024-05-06 VITALS — BP 129/87 | HR 90 | Wt 194.8 lb

## 2024-05-06 DIAGNOSIS — N62 Hypertrophy of breast: Secondary | ICD-10-CM

## 2024-05-06 DIAGNOSIS — Z01818 Encounter for other preprocedural examination: Secondary | ICD-10-CM

## 2024-05-06 MED ORDER — IBUPROFEN 600 MG PO TABS: 600.0000 mg | ORAL_TABLET | Freq: Three times a day (TID) | ORAL | 0 refills | Status: AC | PRN

## 2024-05-06 MED ORDER — ONDANSETRON 4 MG PO TBDP: 4.0000 mg | ORAL_TABLET | Freq: Three times a day (TID) | ORAL | 0 refills | Status: DC | PRN

## 2024-05-06 MED ORDER — ACETAMINOPHEN 500 MG PO TABS: 500.0000 mg | ORAL_TABLET | Freq: Four times a day (QID) | ORAL | 0 refills | Status: AC | PRN

## 2024-05-06 MED ORDER — OXYCODONE HCL 5 MG PO TABS: 5.0000 mg | ORAL_TABLET | ORAL | 0 refills | Status: AC | PRN

## 2024-05-06 NOTE — H&P (View-Only) (Signed)
 Patient ID: Deanna Friedman, female    DOB: Jan 12, 1976, 48 y.o.   MRN: [id number]  Chief Complaint  Patient presents with   Pre-op Exam    No diagnosis found.   History of Present Illness: Deanna Friedman is a 48 y.o.  female  with a history of macromastia.  She presents for preoperative evaluation for upcoming procedure, bilateral breast reduction with liposuction, scheduled for 05/25/2024 with Dr. Carletha Dawn.  The patient has had problems with anesthesia. Will meet anesthesia next week.   PCP clearance in the chart.  Past Medical History: Allergies: Allergies[1]  Current Medications: Current Medications[2]  Past Medical Problems: Past Medical History:  Diagnosis Date   ADHD (attention deficit hyperactivity disorder)    Allergy    food, meds, mold, dust   Anemia    Asthma    Blood transfusion without reported diagnosis    childbirth   Body aches 11/29/2014   BV (bacterial vaginosis) 10/14/2013   Complication of anesthesia    'I wake up combative.   Contraceptive management 03/31/2014   Decreased sex drive 91/88/7983   Fibromyalgia    Folliculitis 01/27/2015   Heart murmur    HTN (hypertension)    Hypercholesteremia 10/09/2016   Migraines    Multiple allergies    Obesity 10/14/2013   Raynaud's disease    Vaginal itching 12/30/2014    Past Surgical History: Past Surgical History:  Procedure Laterality Date   ANTERIOR CRUCIATE LIGAMENT REPAIR Left 01/29/2017   BREAST EXCISIONAL BIOPSY Right    fibroadenoma   DILATION AND CURETTAGE OF UTERUS  2011   DILITATION & CURRETTAGE/HYSTROSCOPY WITH NOVASURE ABLATION N/A 05/07/2017   Procedure: DILATATION & CURETTAGE/HYSTEROSCOPY;  Surgeon: Edsel Norleen GAILS, MD;  Location: [facility];  Service: Gynecology;  Laterality: N/A;   KNEE ARTHROSCOPY Bilateral    LAPAROSCOPIC BILATERAL SALPINGECTOMY Bilateral 05/07/2017   Procedure: LAPAROSCOPIC BILATERAL SALPINGECTOMY;  Surgeon: Edsel Norleen GAILS, MD;  Location: [facility];   Service: Gynecology;  Laterality: Bilateral;   UMBILICAL HERNIA REPAIR N/A 05/07/2017   Procedure: HERNIA REPAIR UMBILICAL ADULT;  Surgeon: Edsel Norleen GAILS, MD;  Location: [facility];  Service: Gynecology;  Laterality: N/A;   VAGINAL HYSTERECTOMY N/A 05/02/2022   Procedure: HYSTERECTOMY VAGINAL, total;  Surgeon: Jayne Vonn VEAR, MD;  Location: [facility];  Service: Gynecology;  Laterality: N/A;    Social History: Social History   Socioeconomic History   Marital status: Married    Spouse name: Warren   Number of children: 3   Years of education: 16   Highest education level: Not on file  Occupational History   Occupation: runner, broadcasting/film/video    Comment: [place]  Tobacco Use   Smoking status: Former    Current packs/day: 0.00    Average packs/day: 0.3 packs/day for 5.0 years (1.3 ttl pk-yrs)    Types: Cigarettes    Start date: 05/02/1994    Quit date: 05/03/1999    Years since quitting: 25.0   Smokeless tobacco: Never  Vaping Use   Vaping status: Never Used  Substance and Sexual Activity   Alcohol use: Yes    Comment: social   Drug use: No   Sexual activity: Yes    Birth control/protection: Surgical    Comment: tubal  Other Topics Concern   Not on file  Social History Narrative   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   Lives with Jamie   Yoga/exercises.    Children, 3 biological.  Social Drivers of Health   Tobacco Use: Medium Risk (04/27/2024)   Received from [facility]   Patient History    Smoking Tobacco Use: Former    Smokeless Tobacco Use: Never    Passive Exposure: Never  Physicist, Medical Strain: Low Risk (12/02/2023)   Received from [facility]   Overall Financial Resource Strain (CARDIA)    How hard is it for you to pay for the very basics like food, housing, medical care, and heating?: Not hard at all  Food Insecurity: No Food Insecurity (12/02/2023)   Received from [facility]   Epic    Within the  past 12 months, you worried that your food would run out before you got the money to buy more.: Never true    Within the past 12 months, the food you bought just didn't last and you didn't have money to get more.: Never true  Transportation Needs: No Transportation Needs (12/02/2023)   Received from [facility]    In the past 12 months, has lack of transportation kept you from medical appointments or from getting medications?: No    In the past 12 months, has lack of transportation kept you from meetings, work, or from getting things needed for daily living?: No  Physical Activity: Insufficiently Active (12/02/2023)   Received from [facility]   Exercise Vital Sign    On average, how many days per week do you engage in moderate to strenuous exercise (like a brisk walk)?: 1 day    On average, how many minutes do you engage in exercise at this level?: 20 min  Stress: No Stress Concern Present (12/02/2023)   Received from [facility] - Occupational Stress Questionnaire    Do you feel stress - tense, restless, nervous, or anxious, or unable to sleep at night because your mind is troubled all the time - these days?: Only a little  Social Connections: Socially Integrated (12/02/2023)   Received from [facility]   Social Network    How would you rate your social network (family, work, friends)?: Good participation with social networks  Intimate Partner Violence: Not At Risk (12/02/2023)   Received from [facility]   HITS    Over the last 12 months how often did your partner physically hurt you?: Never    Over the last 12 months how often did your partner insult you or talk down to you?: Never    Over the last 12 months how often did your partner threaten you with physical harm?: Never    Over the last 12 months how often did your partner scream or curse at you?: Never  Depression (PHQ2-9): Not on file  Alcohol Screen: Not on file  Housing:  Low Risk (12/02/2023)   Received from [facility]    In the last 12 months, was there a time when you were not able to pay the mortgage or rent on time?: No    In the past 12 months, how many times have you moved where you were living?: 0    At any time in the past 12 months, were you homeless or living in a shelter (including now)?: No  Utilities: Not At Risk (12/02/2023)   [facility] from [facility]    In the past 12 months has the electric, gas, oil, or water  company threatened to shut off services in your home?: No  Health Literacy: Not  on file    Family History: Family History  Problem Relation Age of Onset   Hypertension Mother    Heart disease Mother    Alcohol abuse Mother    Arthritis Mother    Asthma Mother    Depression Mother    Alcohol abuse Maternal Grandfather    Hypertension Paternal Grandmother    Diabetes Paternal Grandmother    Cancer Paternal Grandfather        lung   Graves' disease Sister    Other Maternal Grandmother        was murdered   Early death Maternal Grandmother    Alcohol abuse Father    Arthritis Father     Review of Systems: ROS  Physical Exam: Vital Signs BP 129/87 (BP Location: Left Arm, Patient Position: Sitting, Cuff Size: Large)   Pulse 90   Wt 194 lb 12.8 oz (88.4 kg)   LMP  (LMP Unknown)   SpO2 99%   BMI 33.44 kg/m   Physical Exam MA as chaperone Constitutional:      General: Not in acute distress.    Appearance: Normal appearance. Not ill-appearing.  HENT:     Head: Normocephalic and atraumatic.  Eyes:     Pupils: Pupils are equal, round. Cardiovascular:     Pulses: Normal pulses.  Pulmonary:     Effort: No respiratory distress or increased work of breathing.  Speaks in full sentences. Abdominal:     General: Abdomen is flat. No distension.   Musculoskeletal: Normal range of motion. No lower extremity swelling or edema. No varicosities. Skin:    General: Skin is warm and dry.  Neurological:      Mental Status: Alert and oriented to person, place, and time.  Psychiatric:        Mood and Affect: Mood normal.        Behavior: Behavior normal.    Assessment/Plan: The patient is scheduled for bilateral breast reduction with liposuction with Dr. Shevelle Smither.  Risks, benefits, and alternatives of procedure discussed, questions answered and consent obtained.    Smoking Status: Former; Counseling Given? Yes Last Mammogram: 03/16/2024; Results: BI-RADS 1 Negative  Caprini Score: 5; Risk Factors include: Age, BMI > 25, OCPs and length of planned surgery. Recommendation for mechanical prophylaxis. Encourage early ambulation.   Pictures obtained: Yes  Post-op Rx sent to pharmacy: Tylenol , Motrin , Roxicodone , Zofran   Patient was provided with the General Surgical Risk consent document and Pain Medication Agreement prior to their appointment.  They had adequate time to read through the risk consent documents and Pain Medication Agreement. We also discussed them in person together during this preop appointment. All of their questions were answered to their satisfaction.  Recommended calling if they have any further questions.  Risk consent form and Pain Medication Agreement to be scanned into patient's chart.  Photographic consent obtained.   CBC and BMP ordered due to history of spironolactone use.    Electronically signed by: Elias Dennington M Lilliam Chamblee, MD 05/06/2024 9:33 AM      [1]  Allergies Allergen Reactions   Cherry Hives   Nutritional Supplements Anaphylaxis    Pitted fruit, highly reactive to cherries 05/07/17 tolerated Propofol    Shellfish Allergy Hives   Penicillins Other (See Comments)    Ineffective.   Other Hives    Pitted Fruits   Aloe Hives   Cephalexin Other (See Comments)    Severe headache   Coconut Fatty Acid Hives    Coconut eaten   Dust Mite Extract Hives  Food Hives    Tree nuts   Percodan [Oxycodone -Aspirin] Other (See Comments)    Serious headache   [2]  Current Outpatient Medications:    losartan  (COZAAR ) 50 MG tablet, Take 50 mg by mouth daily. (Patient taking differently: Take 25 mg by mouth daily.), Disp: , Rfl:    albuterol  (PROVENTIL  HFA;VENTOLIN  HFA) 108 (90 BASE) MCG/ACT inhaler, Inhale 2 puffs into the lungs every 6 (six) hours as needed for wheezing or shortness of breath., Disp: 1 Inhaler, Rfl: 0   amphetamine-dextroamphetamine (ADDERALL XR) 30 MG 24 hr capsule, Take 30 mg by mouth every morning. (Patient not taking: Reported on 05/06/2024), Disp: , Rfl:    ASHWAGANDHA PO, Take 300 mg by mouth in the morning and at bedtime. (Patient not taking: Reported on 05/06/2024), Disp: , Rfl:    Bioflavonoid Products (VITAMIN C) CHEW, Chew 1 each by mouth daily. 1000 mg, Disp: , Rfl:    Cholecalciferol (VITAMIN D3) 125 MCG (5000 UT) TABS, Take 5,000 Units by mouth daily. (Patient not taking: Reported on 05/06/2024), Disp: , Rfl:    diazepam (VALIUM) 10 MG tablet, Take 5 mg by mouth at bedtime as needed (sleep)., Disp: , Rfl:    diphenhydrAMINE (BENADRYL) 25 MG tablet, Take 25 mg by mouth every 6 (six) hours as needed for allergies., Disp: , Rfl:    hydrochlorothiazide  (MICROZIDE ) 12.5 MG capsule, TAKE 1 CAPSULE(12.5 MG) BY MOUTH DAILY (Patient not taking: Reported on 05/06/2024), Disp: 30 capsule, Rfl: 12   HYDROcodone -acetaminophen  (NORCO/VICODIN) 5-325 MG tablet, Take 1 tablet by mouth every 6 (six) hours as needed., Disp: 28 tablet, Rfl: 0   Ivermectin  (SOOLANTRA ) 1 % CREA, Apply 1 application  topically daily. Apply to face, Disp: 45 g, Rfl: 6   levocetirizine (XYZAL) 5 MG tablet, Take 5 mg by mouth every evening., Disp: , Rfl:    Magnesium 250 MG TABS, Take 250 mg by mouth in the morning and at bedtime., Disp: , Rfl:    Melatonin 5 MG CAPS, Take 5 mg by mouth at bedtime as needed (sleep)., Disp: , Rfl:    methocarbamol (ROBAXIN) 750 MG tablet, Take 750 mg by mouth at bedtime., Disp: , Rfl:    mometasone  (ELOCON ) 0.1 % cream, Apply 1  application topically daily. (Patient taking differently: Apply 1 application  topically daily as needed (hives).), Disp: 45 g, Rfl: 1   montelukast  (SINGULAIR ) 10 MG tablet, Take 1 tablet (10 mg total) by mouth at bedtime., Disp: 30 tablet, Rfl: 3   Nutritional Supplements (ADRENAL COMPLEX PO), Take 1 capsule by mouth daily. Adrenal Health, Disp: , Rfl:    ondansetron  (ZOFRAN -ODT) 8 MG disintegrating tablet, Take 1 tablet (8 mg total) by mouth every 8 (eight) hours as needed for nausea or vomiting., Disp: 8 tablet, Rfl: 0   Potassium Gluconate 550 (90 K) MG TABS, Take 550 mg by mouth daily., Disp: , Rfl:    pregabalin (LYRICA) 100 MG capsule, Take 100 mg by mouth 3 (three) times daily., Disp: , Rfl:    Turmeric (QC TUMERIC COMPLEX PO), Take 1 capsule by mouth daily., Disp: , Rfl:  No current facility-administered medications for this visit.  Facility-Administered Medications Ordered in Other Visits:    sterile water  for irrigation for irrigation, , , PRN, Jayne Vonn DEL, MD, 1,000 mL at 05/02/22 1215

## 2024-05-06 NOTE — Patient Instructions (Signed)
F

## 2024-05-06 NOTE — Progress Notes (Signed)
 Patient ID: Deanna Friedman, female    DOB: Jan 12, 1976, 48 y.o.   MRN: [id number]  Chief Complaint  Patient presents with   Pre-op Exam    No diagnosis found.   History of Present Illness: Deanna Friedman is a 48 y.o.  female  with a history of macromastia.  She presents for preoperative evaluation for upcoming procedure, bilateral breast reduction with liposuction, scheduled for 05/25/2024 with Dr. Carletha Dawn.  The patient has had problems with anesthesia. Will meet anesthesia next week.   PCP clearance in the chart.  Past Medical History: Allergies: Allergies[1]  Current Medications: Current Medications[2]  Past Medical Problems: Past Medical History:  Diagnosis Date   ADHD (attention deficit hyperactivity disorder)    Allergy    food, meds, mold, dust   Anemia    Asthma    Blood transfusion without reported diagnosis    childbirth   Body aches 11/29/2014   BV (bacterial vaginosis) 10/14/2013   Complication of anesthesia    'I wake up combative.   Contraceptive management 03/31/2014   Decreased sex drive 91/88/7983   Fibromyalgia    Folliculitis 01/27/2015   Heart murmur    HTN (hypertension)    Hypercholesteremia 10/09/2016   Migraines    Multiple allergies    Obesity 10/14/2013   Raynaud's disease    Vaginal itching 12/30/2014    Past Surgical History: Past Surgical History:  Procedure Laterality Date   ANTERIOR CRUCIATE LIGAMENT REPAIR Left 01/29/2017   BREAST EXCISIONAL BIOPSY Right    fibroadenoma   DILATION AND CURETTAGE OF UTERUS  2011   DILITATION & CURRETTAGE/HYSTROSCOPY WITH NOVASURE ABLATION N/A 05/07/2017   Procedure: DILATATION & CURETTAGE/HYSTEROSCOPY;  Surgeon: Edsel Norleen GAILS, MD;  Location: [facility];  Service: Gynecology;  Laterality: N/A;   KNEE ARTHROSCOPY Bilateral    LAPAROSCOPIC BILATERAL SALPINGECTOMY Bilateral 05/07/2017   Procedure: LAPAROSCOPIC BILATERAL SALPINGECTOMY;  Surgeon: Edsel Norleen GAILS, MD;  Location: [facility];   Service: Gynecology;  Laterality: Bilateral;   UMBILICAL HERNIA REPAIR N/A 05/07/2017   Procedure: HERNIA REPAIR UMBILICAL ADULT;  Surgeon: Edsel Norleen GAILS, MD;  Location: [facility];  Service: Gynecology;  Laterality: N/A;   VAGINAL HYSTERECTOMY N/A 05/02/2022   Procedure: HYSTERECTOMY VAGINAL, total;  Surgeon: Jayne Vonn VEAR, MD;  Location: [facility];  Service: Gynecology;  Laterality: N/A;    Social History: Social History   Socioeconomic History   Marital status: Married    Spouse name: Warren   Number of children: 3   Years of education: 16   Highest education level: Not on file  Occupational History   Occupation: runner, broadcasting/film/video    Comment: [place]  Tobacco Use   Smoking status: Former    Current packs/day: 0.00    Average packs/day: 0.3 packs/day for 5.0 years (1.3 ttl pk-yrs)    Types: Cigarettes    Start date: 05/02/1994    Quit date: 05/03/1999    Years since quitting: 25.0   Smokeless tobacco: Never  Vaping Use   Vaping status: Never Used  Substance and Sexual Activity   Alcohol use: Yes    Comment: social   Drug use: No   Sexual activity: Yes    Birth control/protection: Surgical    Comment: tubal  Other Topics Concern   Not on file  Social History Narrative   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   Lives with Jamie   Yoga/exercises.    Children, 3 biological.  Social Drivers of Health   Tobacco Use: Medium Risk (04/27/2024)   Received from [facility]   Patient History    Smoking Tobacco Use: Former    Smokeless Tobacco Use: Never    Passive Exposure: Never  Physicist, Medical Strain: Low Risk (12/02/2023)   Received from [facility]   Overall Financial Resource Strain (CARDIA)    How hard is it for you to pay for the very basics like food, housing, medical care, and heating?: Not hard at all  Food Insecurity: No Food Insecurity (12/02/2023)   Received from [facility]   Epic    Within the  past 12 months, you worried that your food would run out before you got the money to buy more.: Never true    Within the past 12 months, the food you bought just didn't last and you didn't have money to get more.: Never true  Transportation Needs: No Transportation Needs (12/02/2023)   Received from [facility]    In the past 12 months, has lack of transportation kept you from medical appointments or from getting medications?: No    In the past 12 months, has lack of transportation kept you from meetings, work, or from getting things needed for daily living?: No  Physical Activity: Insufficiently Active (12/02/2023)   Received from [facility]   Exercise Vital Sign    On average, how many days per week do you engage in moderate to strenuous exercise (like a brisk walk)?: 1 day    On average, how many minutes do you engage in exercise at this level?: 20 min  Stress: No Stress Concern Present (12/02/2023)   Received from [facility] - Occupational Stress Questionnaire    Do you feel stress - tense, restless, nervous, or anxious, or unable to sleep at night because your mind is troubled all the time - these days?: Only a little  Social Connections: Socially Integrated (12/02/2023)   Received from [facility]   Social Network    How would you rate your social network (family, work, friends)?: Good participation with social networks  Intimate Partner Violence: Not At Risk (12/02/2023)   Received from [facility]   HITS    Over the last 12 months how often did your partner physically hurt you?: Never    Over the last 12 months how often did your partner insult you or talk down to you?: Never    Over the last 12 months how often did your partner threaten you with physical harm?: Never    Over the last 12 months how often did your partner scream or curse at you?: Never  Depression (PHQ2-9): Not on file  Alcohol Screen: Not on file  Housing:  Low Risk (12/02/2023)   Received from [facility]    In the last 12 months, was there a time when you were not able to pay the mortgage or rent on time?: No    In the past 12 months, how many times have you moved where you were living?: 0    At any time in the past 12 months, were you homeless or living in a shelter (including now)?: No  Utilities: Not At Risk (12/02/2023)   Received from [facility]    In the past 12 months has the electric, gas, oil, or water  company threatened to shut off services in your home?: No  Health Literacy: Not  on file    Family History: Family History  Problem Relation Age of Onset   Hypertension Mother    Heart disease Mother    Alcohol abuse Mother    Arthritis Mother    Asthma Mother    Depression Mother    Alcohol abuse Maternal Grandfather    Hypertension Paternal Grandmother    Diabetes Paternal Grandmother    Cancer Paternal Grandfather        lung   Graves' disease Sister    Other Maternal Grandmother        was murdered   Early death Maternal Grandmother    Alcohol abuse Father    Arthritis Father     Review of Systems: ROS  Physical Exam: Vital Signs BP 129/87 (BP Location: Left Arm, Patient Position: Sitting, Cuff Size: Large)   Pulse 90   Wt 194 lb 12.8 oz (88.4 kg)   LMP  (LMP Unknown)   SpO2 99%   BMI 33.44 kg/m   Physical Exam MA as chaperone Constitutional:      General: Not in acute distress.    Appearance: Normal appearance. Not ill-appearing.  HENT:     Head: Normocephalic and atraumatic.  Eyes:     Pupils: Pupils are equal, round. Cardiovascular:     Pulses: Normal pulses.  Pulmonary:     Effort: No respiratory distress or increased work of breathing.  Speaks in full sentences. Abdominal:     General: Abdomen is flat. No distension.   Musculoskeletal: Normal range of motion. No lower extremity swelling or edema. No varicosities. Skin:    General: Skin is warm and dry.  Neurological:      Mental Status: Alert and oriented to person, place, and time.  Psychiatric:        Mood and Affect: Mood normal.        Behavior: Behavior normal.    Assessment/Plan: The patient is scheduled for bilateral breast reduction with liposuction with Dr. Shevelle Smither.  Risks, benefits, and alternatives of procedure discussed, questions answered and consent obtained.    Smoking Status: Former; Counseling Given? Yes Last Mammogram: 03/16/2024; Results: BI-RADS 1 Negative  Caprini Score: 5; Risk Factors include: Age, BMI > 25, OCPs and length of planned surgery. Recommendation for mechanical prophylaxis. Encourage early ambulation.   Pictures obtained: Yes  Post-op Rx sent to pharmacy: Tylenol , Motrin , Roxicodone , Zofran   Patient was provided with the General Surgical Risk consent document and Pain Medication Agreement prior to their appointment.  They had adequate time to read through the risk consent documents and Pain Medication Agreement. We also discussed them in person together during this preop appointment. All of their questions were answered to their satisfaction.  Recommended calling if they have any further questions.  Risk consent form and Pain Medication Agreement to be scanned into patient's chart.  Photographic consent obtained.   CBC and BMP ordered due to history of spironolactone use.    Electronically signed by: Elias Dennington M Lilliam Chamblee, MD 05/06/2024 9:33 AM      [1]  Allergies Allergen Reactions   Cherry Hives   Nutritional Supplements Anaphylaxis    Pitted fruit, highly reactive to cherries 05/07/17 tolerated Propofol    Shellfish Allergy Hives   Penicillins Other (See Comments)    Ineffective.   Other Hives    Pitted Fruits   Aloe Hives   Cephalexin Other (See Comments)    Severe headache   Coconut Fatty Acid Hives    Coconut eaten   Dust Mite Extract Hives  Food Hives    Tree nuts   Percodan [Oxycodone -Aspirin] Other (See Comments)    Serious headache   [2]  Current Outpatient Medications:    losartan  (COZAAR ) 50 MG tablet, Take 50 mg by mouth daily. (Patient taking differently: Take 25 mg by mouth daily.), Disp: , Rfl:    albuterol  (PROVENTIL  HFA;VENTOLIN  HFA) 108 (90 BASE) MCG/ACT inhaler, Inhale 2 puffs into the lungs every 6 (six) hours as needed for wheezing or shortness of breath., Disp: 1 Inhaler, Rfl: 0   amphetamine-dextroamphetamine (ADDERALL XR) 30 MG 24 hr capsule, Take 30 mg by mouth every morning. (Patient not taking: Reported on 05/06/2024), Disp: , Rfl:    ASHWAGANDHA PO, Take 300 mg by mouth in the morning and at bedtime. (Patient not taking: Reported on 05/06/2024), Disp: , Rfl:    Bioflavonoid Products (VITAMIN C) CHEW, Chew 1 each by mouth daily. 1000 mg, Disp: , Rfl:    Cholecalciferol (VITAMIN D3) 125 MCG (5000 UT) TABS, Take 5,000 Units by mouth daily. (Patient not taking: Reported on 05/06/2024), Disp: , Rfl:    diazepam (VALIUM) 10 MG tablet, Take 5 mg by mouth at bedtime as needed (sleep)., Disp: , Rfl:    diphenhydrAMINE (BENADRYL) 25 MG tablet, Take 25 mg by mouth every 6 (six) hours as needed for allergies., Disp: , Rfl:    hydrochlorothiazide  (MICROZIDE ) 12.5 MG capsule, TAKE 1 CAPSULE(12.5 MG) BY MOUTH DAILY (Patient not taking: Reported on 05/06/2024), Disp: 30 capsule, Rfl: 12   HYDROcodone -acetaminophen  (NORCO/VICODIN) 5-325 MG tablet, Take 1 tablet by mouth every 6 (six) hours as needed., Disp: 28 tablet, Rfl: 0   Ivermectin  (SOOLANTRA ) 1 % CREA, Apply 1 application  topically daily. Apply to face, Disp: 45 g, Rfl: 6   levocetirizine (XYZAL) 5 MG tablet, Take 5 mg by mouth every evening., Disp: , Rfl:    Magnesium 250 MG TABS, Take 250 mg by mouth in the morning and at bedtime., Disp: , Rfl:    Melatonin 5 MG CAPS, Take 5 mg by mouth at bedtime as needed (sleep)., Disp: , Rfl:    methocarbamol (ROBAXIN) 750 MG tablet, Take 750 mg by mouth at bedtime., Disp: , Rfl:    mometasone  (ELOCON ) 0.1 % cream, Apply 1  application topically daily. (Patient taking differently: Apply 1 application  topically daily as needed (hives).), Disp: 45 g, Rfl: 1   montelukast  (SINGULAIR ) 10 MG tablet, Take 1 tablet (10 mg total) by mouth at bedtime., Disp: 30 tablet, Rfl: 3   Nutritional Supplements (ADRENAL COMPLEX PO), Take 1 capsule by mouth daily. Adrenal Health, Disp: , Rfl:    ondansetron  (ZOFRAN -ODT) 8 MG disintegrating tablet, Take 1 tablet (8 mg total) by mouth every 8 (eight) hours as needed for nausea or vomiting., Disp: 8 tablet, Rfl: 0   Potassium Gluconate 550 (90 K) MG TABS, Take 550 mg by mouth daily., Disp: , Rfl:    pregabalin (LYRICA) 100 MG capsule, Take 100 mg by mouth 3 (three) times daily., Disp: , Rfl:    Turmeric (QC TUMERIC COMPLEX PO), Take 1 capsule by mouth daily., Disp: , Rfl:  No current facility-administered medications for this visit.  Facility-Administered Medications Ordered in Other Visits:    sterile water  for irrigation for irrigation, , , PRN, Jayne Vonn DEL, MD, 1,000 mL at 05/02/22 1215

## 2024-05-11 NOTE — Pre-Procedure Instructions (Signed)
 Surgical Instructions   Your procedure is scheduled on Monday, January 5th.  Report to [facility] Main Entrance A at 0530 A.M., then check in with the Admitting office. Any questions or running late day of surgery: call [phone]  Questions prior to your surgery date: call [phone], Monday-Friday, 8am-4pm. If you experience any cold or flu symptoms such as cough, fever, chills, shortness of breath, etc. between now and your scheduled surgery, please notify us  at the above number.     Remember:  Do not eat or drink after midnight the night before your surgery   Take these medicines the morning of surgery with A SIP OF WATER : pregabalin (LYRICA)  Loratadine (CLARITIN)  methocarbamol (ROBAXIN)  montelukast  (SINGULAIR )   May take these medicines IF NEEDED:  albuterol  inhaler - please bring it with you diphenhydrAMINE (BENADRYL)  HYDROcodone -acetaminophen  (NORCO/VICODIN)   One week prior to surgery, STOP taking any Aspirin (unless otherwise instructed by your surgeon) Aleve, Naproxen, Ibuprofen , Motrin , Advil , Goody's, BC's, all herbal medications, fish oil, and non-prescription vitamins. This includes meloxicam  (MOBIC ).                      Do NOT Smoke (Tobacco/Vaping) for 24 hours prior to your procedure.  If you use a CPAP at night, you may bring your mask/headgear for your overnight stay.   You will be asked to remove any contacts, glasses, piercing's, hearing aid's, dentures/partials prior to surgery. Please bring cases for these items if needed.    Patients discharged the day of surgery will not be allowed to drive home, and someone needs to stay with them for 24 hours.  SURGICAL WAITING ROOM VISITATION Patients may have no more than 2 support people in the waiting area - these visitors may rotate.   Pre-op nurse will coordinate an appropriate time for 1 ADULT support person, who may not rotate, to accompany patient in pre-op.  Children under the age of 42 must  have an adult with them who is not the patient and must remain in the main waiting area with an adult.  If the patient needs to stay at the hospital during part of their recovery, the visitor guidelines for inpatient rooms apply.  Please refer to the [facility] website for the visitor guidelines for any additional information.   If you received a COVID test during your pre-op visit  it is requested that you wear a mask when out in public, stay away from anyone that may not be feeling well and notify your surgeon if you develop symptoms. If you have been in contact with anyone that has tested positive in the last 10 days please notify you surgeon.      Pre-operative CHG Bathing Instructions   You can play a key role in reducing the risk of infection after surgery. Your skin needs to be as free of germs as possible. You can reduce the number of germs on your skin by washing with CHG (chlorhexidine  gluconate) soap before surgery. CHG is an antiseptic soap that kills germs and continues to kill germs even after washing.   DO NOT use if you have an allergy to chlorhexidine /CHG or antibacterial soaps. If your skin becomes reddened or irritated, stop using the CHG and notify one of our RNs at [phone].              TAKE A SHOWER THE NIGHT BEFORE SURGERY   Please keep in mind the following:  DO NOT shave, including legs  and underarms, 48 hours prior to surgery.   You may shave your face before/day of surgery.  Place clean sheets on your bed the night before surgery Use a clean washcloth (not used since being washed) for shower. DO NOT sleep with pet's night before surgery.  CHG Shower Instructions:  Wash your face and private area with normal soap. If you choose to wash your hair, wash first with your normal shampoo.  After you use shampoo/soap, rinse your hair and body thoroughly to remove shampoo/soap residue.  Turn the water  OFF and apply half the bottle of CHG soap to a CLEAN  washcloth.  Apply CHG soap ONLY FROM YOUR NECK DOWN TO YOUR TOES (washing for 3-5 minutes)  DO NOT use CHG soap on face, private areas, open wounds, or sores.  Pay special attention to the area where your surgery is being performed.  If you are having back surgery, having someone wash your back for you may be helpful. Wait 2 minutes after CHG soap is applied, then you may rinse off the CHG soap.  Pat dry with a clean towel  Put on clean pajamas    Additional instructions for the day of surgery: If you choose, you may shower the morning of surgery with an antibacterial soap.  DO NOT APPLY any lotions, deodorants, cologne, or perfumes.   Do not wear jewelry or makeup Do not wear nail polish, gel polish, artificial nails, or any other type of covering on natural nails (fingers and toes) Do not bring valuables to the hospital. [facility] is not responsible for valuables/personal belongings. Put on clean/comfortable clothes.  Please brush your teeth.  Ask your nurse before applying any prescription medications to the skin.

## 2024-05-12 ENCOUNTER — Encounter (HOSPITAL_COMMUNITY)
Admission: RE | Admit: 2024-05-12 | Discharge: 2024-05-12 | Disposition: A | Discharge status: Home / Self Care | Source: Ambulatory Visit

## 2024-05-12 ENCOUNTER — Other Ambulatory Visit: Payer: Self-pay

## 2024-05-12 ENCOUNTER — Encounter (HOSPITAL_COMMUNITY): Payer: Self-pay

## 2024-05-12 VITALS — BP 129/89 | HR 94 | Temp 98.0°F | Resp 18 | Ht 63.0 in | Wt 192.4 lb

## 2024-05-12 DIAGNOSIS — Z87891 Personal history of nicotine dependence: Secondary | ICD-10-CM | POA: Insufficient documentation

## 2024-05-12 DIAGNOSIS — J45909 Unspecified asthma, uncomplicated: Secondary | ICD-10-CM | POA: Diagnosis not present

## 2024-05-12 DIAGNOSIS — I10 Essential (primary) hypertension: Secondary | ICD-10-CM | POA: Insufficient documentation

## 2024-05-12 DIAGNOSIS — I7300 Raynaud's syndrome without gangrene: Secondary | ICD-10-CM | POA: Insufficient documentation

## 2024-05-12 DIAGNOSIS — N62 Hypertrophy of breast: Secondary | ICD-10-CM | POA: Insufficient documentation

## 2024-05-12 DIAGNOSIS — Z01810 Encounter for preprocedural cardiovascular examination: Secondary | ICD-10-CM | POA: Insufficient documentation

## 2024-05-12 DIAGNOSIS — M797 Fibromyalgia: Secondary | ICD-10-CM | POA: Insufficient documentation

## 2024-05-12 LAB — BASIC METABOLIC PANEL WITH GFR
BUN: 16 mg/dL (ref 7–25)
CO2: 27 mmol/L (ref 20–32)
Calcium: 8.9 mg/dL (ref 8.6–10.4)
Chloride: 103 mmol/L (ref 98–110)
Creat: 0.81 mg/dL (ref 0.50–1.03)
Glucose, Bld: 91 mg/dL (ref 65–139)
Potassium: 3.8 mmol/L (ref 3.5–5.3)
Sodium: 140 mmol/L (ref 135–146)
eGFR: 88 mL/min/1.73m2

## 2024-05-12 LAB — CBC WITH DIFFERENTIAL/PLATELET
Absolute Lymphocytes: 1801 {cells}/uL (ref 850–3900)
Absolute Monocytes: 593 {cells}/uL (ref 200–950)
Basophils Absolute: 38 {cells}/uL (ref 0–200)
Basophils Relative: 0.5 %
Eosinophils Absolute: 281 {cells}/uL (ref 15–500)
Eosinophils Relative: 3.7 %
HCT: 45.3 % (ref 35.9–46.0)
Hemoglobin: 15.1 g/dL (ref 11.7–15.5)
MCH: 31.6 pg (ref 27.0–33.0)
MCHC: 33.3 g/dL (ref 31.6–35.4)
MCV: 94.8 fL (ref 81.4–101.7)
MPV: 10.5 fL (ref 7.5–12.5)
Monocytes Relative: 7.8 %
Neutro Abs: 4887 {cells}/uL (ref 1500–7800)
Neutrophils Relative %: 64.3 %
Platelets: 226 Thousand/uL (ref 140–400)
RBC: 4.78 Million/uL (ref 3.80–5.10)
RDW: 11.9 % (ref 11.0–15.0)
Total Lymphocyte: 23.7 %
WBC: 7.6 Thousand/uL (ref 3.8–10.8)

## 2024-05-12 NOTE — Progress Notes (Signed)
 PCP - Suanne Pfeiffer, NP Cardiologist - denies  PPM/ICD - denies Device Orders - n/a Rep Notified - n/a  Chest x-ray -08/19/2015 EKG - 05/12/2024 Stress Test - denies ECHO - denies Cardiac Cath - denies  Sleep Study - denies CPAP - n/a  Fasting Blood Sugar - no DM Checks Blood Sugar _____ times a day  Last dose of GLP1 agonist-  n/a GLP1 instructions: n/a  Blood Thinner Instructions: n/a Aspirin Instructions: n/a  ERAS Protcol -no, NPO PRE-SURGERY Ensure or G2- n/a  COVID TEST- n/a   Anesthesia review: yes, hx of HTN, heart murmur (per pt was was told she had it a long time ago and no follow up was needed), fibromyalgia. Per pt in 2023 she had a complication due to anesthesia: hard time waking up, I woke up combative.   Patient denies shortness of breath, fever, cough and chest pain at PAT appointment   All instructions explained to the patient, with a verbal understanding of the material. Patient agrees to go over the instructions while at home for a better understanding. Patient also instructed to self quarantine after being tested for COVID-19. The opportunity to ask questions was provided.

## 2024-05-13 NOTE — Progress Notes (Signed)
 Anesthesia Chart Review:  Case: [id number] Date/Time: 05/25/24 0715   Procedure: BREAST REDUCTION WITH LIPOSUCTION (Bilateral: Breast)   Anesthesia type: Choice   Diagnosis: Hypertrophy of breast [N62]   Pre-op diagnosis: n62   [facility] ROOM [facility] / [facility]   Surgeons: Montorfano, Lisandro M, MD       DISCUSSION: Patient is a 48 year old female scheduled for the above procedure.  History includes former smoker (quit 2000), HTN, murmur, hypercholesterolemia, asthma, ADHD, migraines, anemia, fibromyalgia, Raynaud's disease, mast cell activation syndrome, hysterectomy (05/02/2022), umbilical hernia repair (05/07/2017).  She reported history of waking up combative after anesthesia in 2023.  She reported a remote history of being told she had a murmur but not more recently with no follow-up recommended.  She denied prior cardiac testing such as echo. Anesthesia APP was not asked to evaluate during her PAT visit, but multiple providers have documented no murmur or normal heart sounds.  She was medically cleared by her PCP Suanne Pfeiffer, NP (scanned under Media tab, 03/11/2024).  Anesthesia team to evaluate on the day of surgery.  VS: BP 129/89   Pulse 94   Temp 36.7 C   Resp 18   Ht 5' 3 (1.6 m)   Wt 87.3 kg   LMP  (LMP Unknown)   SpO2 99%   BMI 34.08 kg/m   PROVIDERS: Suanne Pfeiffer, NP is PCP    LABS: Most recent lab results in [facility] include: Lab Results  Component Value Date   WBC 7.6 05/11/2024   HGB 15.1 05/11/2024   HCT 45.3 05/11/2024   PLT 226 05/11/2024   GLUCOSE 91 05/11/2024   NA 140 05/11/2024   K 3.8 05/11/2024   CL 103 05/11/2024   CREATININE 0.81 05/11/2024   BUN 16 05/11/2024   CO2 27 05/11/2024     EKG: 05/12/2024: Normal sinus rhythm Low voltage QRS Borderline ECG   CV: N/A  Past Medical History:  Diagnosis Date   ADHD (attention deficit hyperactivity disorder)    Allergy    food, meds, mold, dust   Anemia    Asthma    Blood  transfusion without reported diagnosis    childbirth   Body aches 11/29/2014   BV (bacterial vaginosis) 10/14/2013   Complication of anesthesia 2023   'I wake up combative.   Contraceptive management 03/31/2014   Decreased sex drive 91/88/7983   Fibromyalgia    Folliculitis 01/27/2015   Heart murmur    HTN (hypertension)    Hypercholesteremia 10/09/2016   Mast cell activation syndrome    Migraines    Multiple allergies    Obesity 10/14/2013   Raynaud's disease    Vaginal itching 12/30/2014    Past Surgical History:  Procedure Laterality Date   ANTERIOR CRUCIATE LIGAMENT REPAIR Left 01/29/2017   BREAST EXCISIONAL BIOPSY Right    fibroadenoma   DILATION AND CURETTAGE OF UTERUS  2011   DILITATION & CURRETTAGE/HYSTROSCOPY WITH NOVASURE ABLATION N/A 05/07/2017   Procedure: DILATATION & CURETTAGE/HYSTEROSCOPY;  Surgeon: Edsel Norleen GAILS, MD;  Location: [facility];  Service: Gynecology;  Laterality: N/A;   EYE SURGERY Bilateral 2019   lasik sx   KNEE ARTHROSCOPY Bilateral    LAPAROSCOPIC BILATERAL SALPINGECTOMY Bilateral 05/07/2017   Procedure: LAPAROSCOPIC BILATERAL SALPINGECTOMY;  Surgeon: Edsel Norleen GAILS, MD;  Location: [facility];  Service: Gynecology;  Laterality: Bilateral;   UMBILICAL HERNIA REPAIR N/A 05/07/2017   Procedure: HERNIA REPAIR UMBILICAL ADULT;  Surgeon: Edsel Norleen GAILS, MD;  Location: [facility];  Service: Gynecology;  Laterality: N/A;   VAGINAL HYSTERECTOMY N/A 05/02/2022   Procedure: HYSTERECTOMY VAGINAL, total;  Surgeon: Jayne Vonn DEL, MD;  Location: [facility];  Service: Gynecology;  Laterality: N/A;    MEDICATIONS:  acetaminophen  (TYLENOL ) 500 MG tablet   albuterol  (PROVENTIL  HFA;VENTOLIN  HFA) 108 (90 BASE) MCG/ACT inhaler   amphetamine-dextroamphetamine (ADDERALL) 30 MG tablet   Ascorbic Acid (VITAMIN C) 1000 MG tablet   ASHWAGANDHA PO   Coenzyme Q10 (COQ-10) 100 MG CAPS   diazepam (VALIUM) 10 MG tablet   diphenhydrAMINE (BENADRYL) 25 MG tablet    HYDROcodone -acetaminophen  (NORCO/VICODIN) 5-325 MG tablet   ibuprofen  (ADVIL ) 600 MG tablet   levocetirizine (XYZAL) 5 MG tablet   Loratadine (CLARITIN) 10 MG CAPS   Magnesium 250 MG TABS   meloxicam  (MOBIC ) 15 MG tablet   methocarbamol (ROBAXIN) 750 MG tablet   Misc Natural Products (TURMERIC, CURCUMIN, PO)   montelukast  (SINGULAIR ) 10 MG tablet   Omega-3 Fatty Acids (FISH OIL ULTRA) 1400 MG CAPS   oxyCODONE  (ROXICODONE ) 5 MG immediate release tablet   pregabalin (LYRICA) 100 MG capsule   Pyridoxine HCl (VITAMIN B6) 250 MG TABS   spironolactone (ALDACTONE) 25 MG tablet    sterile water  for irrigation for irrigation    Isaiah Ruder, PA-C Surgical Short Stay/Anesthesiology [facility] Phone [phone] [employer] Phone [phone] 05/13/2024 3:54 PM

## 2024-05-13 NOTE — Anesthesia Preprocedure Evaluation (Addendum)
 "                                  Anesthesia Evaluation  Patient identified by MRN, date of birth, ID band Patient awake    Reviewed: Allergy & Precautions, NPO status , Patient's Chart, lab work & pertinent test results  History of Anesthesia Complications (+) Emergence Delirium and history of anesthetic complications  Airway Mallampati: II  TM Distance: >3 FB     Dental no notable dental hx. (+) Teeth Intact, Dental Advisory Given   Pulmonary asthma , former smoker   Pulmonary exam normal breath sounds clear to auscultation       Cardiovascular hypertension, Pt. on medications Normal cardiovascular exam+ Valvular Problems/Murmurs  Rhythm:Regular Rate:Normal  Raynaud's disease  EKG 05/12/24 NSR, low voltage   Neuro/Psych  Headaches PSYCHIATRIC DISORDERS      ADHD Neuromuscular disease    GI/Hepatic negative GI ROS, Neg liver ROS,,,  Endo/Other  Breast hypertrophy Obesity  Renal/GU negative Renal ROSLab Results      Component                Value               Date                      NA                       140                 05/11/2024                CL                       103                 05/11/2024                K                        3.8                 05/11/2024                CO2                      27                  05/11/2024                BUN                      16                  05/11/2024                CREATININE               0.81                05/11/2024                EGFR                     88  05/11/2024                CALCIUM                  8.9                 05/11/2024                ALBUMIN                  3.9                 04/30/2022                GLUCOSE                  91                  05/11/2024             negative genitourinary   Musculoskeletal  (+)  Fibromyalgia -  Abdominal  (+) + obese  Peds  Hematology  (+) Blood dyscrasia, anemia Lab Results      Component                 Value               Date                      WBC                      7.6                 05/11/2024                HGB                      15.1                05/11/2024                HCT                      45.3                05/11/2024                MCV                      94.8                05/11/2024                PLT                      226                 05/11/2024            Mast cell activation syndrome   Anesthesia Other Findings   Reproductive/Obstetrics                              Anesthesia Physical Anesthesia Plan  ASA: 3  Anesthesia Plan: General   Post-op Pain Management: Dilaudid  IV, Precedex  and Tylenol  PO (pre-op)*   Induction:   PONV Risk Score and Plan: 4 or greater and Treatment  may vary due to age or medical condition, Midazolam , Ondansetron , Scopolamine  patch - Pre-op and Dexamethasone   Airway Management Planned: Oral ETT  Additional Equipment: None  Intra-op Plan:   Post-operative Plan: Extubation in OR  Informed Consent: I have reviewed the patients History and Physical, chart, labs and discussed the procedure including the risks, benefits and alternatives for the proposed anesthesia with the patient or authorized representative who has indicated his/her understanding and acceptance.     Dental advisory given  Plan Discussed with: CRNA and Anesthesiologist  Anesthesia Plan Comments: (PAT note written 05/13/2024 by Isaiah Ruder, PA-C.  Singulair  and albuterol  preop )         Anesthesia Quick Evaluation  "

## 2024-05-24 ENCOUNTER — Encounter (HOSPITAL_COMMUNITY): Payer: Self-pay

## 2024-05-25 ENCOUNTER — Other Ambulatory Visit: Payer: Self-pay

## 2024-05-25 ENCOUNTER — Ambulatory Visit (HOSPITAL_COMMUNITY): Admission: RE | Admit: 2024-05-25 | Discharge: 2024-05-25 | Disposition: A | Discharge status: Home / Self Care

## 2024-05-25 ENCOUNTER — Ambulatory Visit (HOSPITAL_COMMUNITY): Payer: Self-pay | Admitting: Vascular Surgery

## 2024-05-25 ENCOUNTER — Encounter (HOSPITAL_COMMUNITY): Admission: RE | Disposition: A | Discharge status: Home / Self Care | Payer: Self-pay | Source: Home / Self Care

## 2024-05-25 ENCOUNTER — Encounter (HOSPITAL_COMMUNITY): Payer: Self-pay

## 2024-05-25 ENCOUNTER — Ambulatory Visit (HOSPITAL_COMMUNITY): Payer: Self-pay | Admitting: Registered Nurse

## 2024-05-25 DIAGNOSIS — E669 Obesity, unspecified: Secondary | ICD-10-CM | POA: Insufficient documentation

## 2024-05-25 DIAGNOSIS — J45909 Unspecified asthma, uncomplicated: Secondary | ICD-10-CM | POA: Insufficient documentation

## 2024-05-25 DIAGNOSIS — N6042 Mammary duct ectasia of left breast: Secondary | ICD-10-CM | POA: Insufficient documentation

## 2024-05-25 DIAGNOSIS — F909 Attention-deficit hyperactivity disorder, unspecified type: Secondary | ICD-10-CM | POA: Insufficient documentation

## 2024-05-25 DIAGNOSIS — N6081 Other benign mammary dysplasias of right breast: Secondary | ICD-10-CM | POA: Diagnosis not present

## 2024-05-25 DIAGNOSIS — R21 Rash and other nonspecific skin eruption: Secondary | ICD-10-CM | POA: Insufficient documentation

## 2024-05-25 DIAGNOSIS — M542 Cervicalgia: Secondary | ICD-10-CM | POA: Insufficient documentation

## 2024-05-25 DIAGNOSIS — I10 Essential (primary) hypertension: Secondary | ICD-10-CM

## 2024-05-25 DIAGNOSIS — I7300 Raynaud's syndrome without gangrene: Secondary | ICD-10-CM | POA: Diagnosis not present

## 2024-05-25 DIAGNOSIS — Z6833 Body mass index (BMI) 33.0-33.9, adult: Secondary | ICD-10-CM | POA: Diagnosis not present

## 2024-05-25 DIAGNOSIS — N6041 Mammary duct ectasia of right breast: Secondary | ICD-10-CM | POA: Insufficient documentation

## 2024-05-25 DIAGNOSIS — D241 Benign neoplasm of right breast: Secondary | ICD-10-CM | POA: Diagnosis not present

## 2024-05-25 DIAGNOSIS — N6021 Fibroadenosis of right breast: Secondary | ICD-10-CM | POA: Diagnosis not present

## 2024-05-25 DIAGNOSIS — N6022 Fibroadenosis of left breast: Secondary | ICD-10-CM | POA: Insufficient documentation

## 2024-05-25 DIAGNOSIS — N62 Hypertrophy of breast: Secondary | ICD-10-CM | POA: Insufficient documentation

## 2024-05-25 DIAGNOSIS — M797 Fibromyalgia: Secondary | ICD-10-CM | POA: Diagnosis not present

## 2024-05-25 DIAGNOSIS — Z87891 Personal history of nicotine dependence: Secondary | ICD-10-CM

## 2024-05-25 DIAGNOSIS — D8940 Mast cell activation, unspecified: Secondary | ICD-10-CM | POA: Insufficient documentation

## 2024-05-25 DIAGNOSIS — N6489 Other specified disorders of breast: Secondary | ICD-10-CM | POA: Insufficient documentation

## 2024-05-25 DIAGNOSIS — Z79899 Other long term (current) drug therapy: Secondary | ICD-10-CM | POA: Diagnosis not present

## 2024-05-25 DIAGNOSIS — N6011 Diffuse cystic mastopathy of right breast: Secondary | ICD-10-CM | POA: Insufficient documentation

## 2024-05-25 HISTORY — PX: BREAST REDUCTION SURGERY: SHX8

## 2024-05-25 SURGERY — BREAST REDUCTION WITH LIPOSUCTION
Anesthesia: General | Site: Breast | Laterality: Bilateral

## 2024-05-25 MED ORDER — PHENYLEPHRINE 80 MCG/ML (10ML) SYRINGE FOR IV PUSH (FOR BLOOD PRESSURE SUPPORT)
PREFILLED_SYRINGE | INTRAVENOUS | Status: AC
  Filled 2024-05-25: qty 10

## 2024-05-25 MED ORDER — HEMOSTATIC AGENTS (NO CHARGE) OPTIME
TOPICAL | Status: DC | PRN
  Administered 2024-05-25 (×2): 1

## 2024-05-25 MED ORDER — MONTELUKAST SODIUM 10 MG PO TABS
10.0000 mg | ORAL_TABLET | Freq: Every day | ORAL | Status: DC
  Filled 2024-05-25: qty 1

## 2024-05-25 MED ORDER — PROPOFOL 10 MG/ML IV BOLUS
INTRAVENOUS | Status: AC
  Filled 2024-05-25: qty 20

## 2024-05-25 MED ORDER — HYDROMORPHONE HCL 1 MG/ML IJ SOLN
0.2500 mg | INTRAMUSCULAR | Status: DC | PRN
  Administered 2024-05-25: 0.5 mg via INTRAVENOUS

## 2024-05-25 MED ORDER — CLINDAMYCIN PHOSPHATE 900 MG/50ML IV SOLN
900.0000 mg | INTRAVENOUS | Status: AC
  Administered 2024-05-25: 900 mg via INTRAVENOUS
  Filled 2024-05-25: qty 50

## 2024-05-25 MED ORDER — ORAL CARE MOUTH RINSE: 15.0000 mL | Freq: Once | OROMUCOSAL | Status: AC

## 2024-05-25 MED ORDER — HYDROGEN PEROXIDE 3 % EX SOLN
CUTANEOUS | Status: DC | PRN
  Administered 2024-05-25 (×2): 1

## 2024-05-25 MED ORDER — ACETAMINOPHEN 500 MG PO TABS
1000.0000 mg | ORAL_TABLET | ORAL | Status: AC
  Administered 2024-05-25: 1000 mg via ORAL
  Filled 2024-05-25: qty 2

## 2024-05-25 MED ORDER — SODIUM BICARBONATE 4.2 % IV SOLN
Freq: Once | INTRAVENOUS | Status: DC
  Filled 2024-05-25: qty 50

## 2024-05-25 MED ORDER — BUPIVACAINE-EPINEPHRINE (PF) 0.25% -1:200000 IJ SOLN
INTRAMUSCULAR | Status: AC
  Filled 2024-05-25: qty 30

## 2024-05-25 MED ORDER — LIDOCAINE 2% (20 MG/ML) 5 ML SYRINGE
INTRAMUSCULAR | Status: AC
  Filled 2024-05-25: qty 5

## 2024-05-25 MED ORDER — CHLORHEXIDINE GLUCONATE CLOTH 2 % EX PADS: 6.0000 | MEDICATED_PAD | Freq: Once | CUTANEOUS | Status: DC

## 2024-05-25 MED ORDER — BACITRACIN ZINC 500 UNIT/GM EX OINT
TOPICAL_OINTMENT | CUTANEOUS | Status: AC
  Filled 2024-05-25: qty 28.35

## 2024-05-25 MED ORDER — FENTANYL CITRATE (PF) 250 MCG/5ML IJ SOLN
INTRAMUSCULAR | Status: AC
  Filled 2024-05-25: qty 5

## 2024-05-25 MED ORDER — LACTATED RINGERS IV SOLN: INTRAVENOUS | Status: DC | PRN

## 2024-05-25 MED ORDER — HYDROMORPHONE HCL 1 MG/ML IJ SOLN
INTRAMUSCULAR | Status: AC
  Filled 2024-05-25: qty 0.5

## 2024-05-25 MED ORDER — ROCURONIUM 10MG/ML (10ML) SYRINGE FOR MEDFUSION PUMP - OPTIME
INTRAVENOUS | Status: DC | PRN
  Administered 2024-05-25: 30 mg via INTRAVENOUS
  Administered 2024-05-25: 20 mg via INTRAVENOUS
  Administered 2024-05-25: 50 mg via INTRAVENOUS

## 2024-05-25 MED ORDER — BUPIVACAINE HCL (PF) 0.25 % IJ SOLN
INTRAMUSCULAR | Status: AC
  Filled 2024-05-25: qty 30

## 2024-05-25 MED ORDER — SUGAMMADEX SODIUM 200 MG/2ML IV SOLN
INTRAVENOUS | Status: DC | PRN
  Administered 2024-05-25: 200 mg via INTRAVENOUS

## 2024-05-25 MED ORDER — ONDANSETRON HCL 4 MG/2ML IJ SOLN
INTRAMUSCULAR | Status: DC | PRN
  Administered 2024-05-25: 4 mg via INTRAVENOUS

## 2024-05-25 MED ORDER — FENTANYL CITRATE (PF) 100 MCG/2ML IJ SOLN
INTRAMUSCULAR | Status: DC | PRN
  Administered 2024-05-25: 100 ug via INTRAVENOUS
  Administered 2024-05-25 (×3): 50 ug via INTRAVENOUS

## 2024-05-25 MED ORDER — HYDROMORPHONE HCL 1 MG/ML IJ SOLN
INTRAMUSCULAR | Status: AC
  Filled 2024-05-25: qty 1

## 2024-05-25 MED ORDER — PROPOFOL 10 MG/ML IV BOLUS
INTRAVENOUS | Status: DC | PRN
  Administered 2024-05-25: 170 mg via INTRAVENOUS
  Administered 2024-05-25: 30 mg via INTRAVENOUS

## 2024-05-25 MED ORDER — DEXAMETHASONE SOD PHOSPHATE PF 10 MG/ML IJ SOLN
INTRAMUSCULAR | Status: DC | PRN
  Administered 2024-05-25: 10 mg via INTRAVENOUS

## 2024-05-25 MED ORDER — CHLORHEXIDINE GLUCONATE 0.12 % MT SOLN
15.0000 mL | Freq: Once | OROMUCOSAL | Status: AC
  Administered 2024-05-25: 15 mL via OROMUCOSAL
  Filled 2024-05-25: qty 15

## 2024-05-25 MED ORDER — ROCURONIUM BROMIDE 10 MG/ML (PF) SYRINGE
PREFILLED_SYRINGE | INTRAVENOUS | Status: AC
  Filled 2024-05-25: qty 10

## 2024-05-25 MED ORDER — BUPIVACAINE-EPINEPHRINE 0.25% -1:200000 IJ SOLN
INTRAMUSCULAR | Status: DC | PRN
  Administered 2024-05-25: 30 mL

## 2024-05-25 MED ORDER — HYDROMORPHONE HCL 1 MG/ML IJ SOLN
INTRAMUSCULAR | Status: DC | PRN
  Administered 2024-05-25: .5 mg via INTRAVENOUS

## 2024-05-25 MED ORDER — SCOPOLAMINE 1 MG/3DAYS TD PT72: 1.0000 | MEDICATED_PATCH | TRANSDERMAL | Status: DC

## 2024-05-25 MED ORDER — PHENYLEPHRINE 80 MCG/ML (10ML) SYRINGE FOR IV PUSH (FOR BLOOD PRESSURE SUPPORT)
PREFILLED_SYRINGE | INTRAVENOUS | Status: DC | PRN
  Administered 2024-05-25 (×7): 80 ug via INTRAVENOUS

## 2024-05-25 MED ORDER — MIDAZOLAM HCL 5 MG/5ML IJ SOLN
INTRAMUSCULAR | Status: DC | PRN
  Administered 2024-05-25: 2 mg via INTRAVENOUS

## 2024-05-25 MED ORDER — ONDANSETRON HCL 4 MG/2ML IJ SOLN: 4.0000 mg | Freq: Once | INTRAMUSCULAR | Status: DC | PRN

## 2024-05-25 MED ORDER — LACTATED RINGERS IV SOLN: INTRAVENOUS | Status: DC

## 2024-05-25 MED ORDER — SCOPOLAMINE 1 MG/3DAYS TD PT72
1.0000 | MEDICATED_PATCH | TRANSDERMAL | Status: DC
  Administered 2024-05-25: 1 mg via TRANSDERMAL
  Filled 2024-05-25: qty 1

## 2024-05-25 MED ORDER — DEXMEDETOMIDINE HCL IN NACL 80 MCG/20ML IV SOLN
INTRAVENOUS | Status: AC
  Filled 2024-05-25: qty 20

## 2024-05-25 MED ORDER — HYDROCODONE-ACETAMINOPHEN 7.5-325 MG PO TABS: 1.0000 | ORAL_TABLET | Freq: Once | ORAL | Status: DC | PRN

## 2024-05-25 MED ORDER — DEXMEDETOMIDINE HCL IN NACL 80 MCG/20ML IV SOLN
INTRAVENOUS | Status: DC | PRN
  Administered 2024-05-25: 12 ug via INTRAVENOUS
  Administered 2024-05-25: 8 ug via INTRAVENOUS

## 2024-05-25 MED ORDER — MIDAZOLAM HCL 2 MG/2ML IJ SOLN
INTRAMUSCULAR | Status: AC
  Filled 2024-05-25: qty 2

## 2024-05-25 MED ORDER — VASHE WOUND IRRIGATION OPTIME
TOPICAL | Status: DC | PRN
  Administered 2024-05-25 (×2): 34 [oz_av]

## 2024-05-25 MED ORDER — DROPERIDOL 2.5 MG/ML IJ SOLN: 0.6250 mg | Freq: Once | INTRAMUSCULAR | Status: DC | PRN

## 2024-05-25 MED ORDER — ONDANSETRON HCL 4 MG/2ML IJ SOLN
INTRAMUSCULAR | Status: AC
  Filled 2024-05-25: qty 2

## 2024-05-25 MED ORDER — 0.9 % SODIUM CHLORIDE (POUR BTL) OPTIME
TOPICAL | Status: DC | PRN
  Administered 2024-05-25: 1000 mL

## 2024-05-25 MED ORDER — LIDOCAINE 2% (20 MG/ML) 5 ML SYRINGE
INTRAMUSCULAR | Status: DC | PRN
  Administered 2024-05-25: 40 mg via INTRAVENOUS

## 2024-05-25 SURGICAL SUPPLY — 51 items
BAG COUNTER SPONGE SURGICOUNT (BAG) ×1 IMPLANT
BINDER BREAST XXLRG (GAUZE/BANDAGES/DRESSINGS) IMPLANT
BLADE SURG 10 STRL SS (BLADE) ×2 IMPLANT
CANISTER LIPO FAT HARVEST (MISCELLANEOUS) ×1 IMPLANT
CANISTER SUCTION 3000ML PPV (SUCTIONS) ×1 IMPLANT
CHLORAPREP W/TINT 26 (MISCELLANEOUS) ×1 IMPLANT
CLEANER CAUTERY TIP PAD (MISCELLANEOUS) IMPLANT
CLEANSER WND VASHE 34 (WOUND CARE) ×1 IMPLANT
DERMABOND ADVANCED .7 DNX12 (GAUZE/BANDAGES/DRESSINGS) IMPLANT
DRAPE HALF SHEET 40X57 (DRAPES) ×2 IMPLANT
DRAPE UTILITY XL STRL (DRAPES) ×1 IMPLANT
DRSG MEPILEX POST OP 4X8 (GAUZE/BANDAGES/DRESSINGS) ×2 IMPLANT
DRSG TEGADERM 4X4.75 (GAUZE/BANDAGES/DRESSINGS) ×2 IMPLANT
DRSG TELFA 3X8 NADH STRL (GAUZE/BANDAGES/DRESSINGS) ×1 IMPLANT
ELECT CAUTERY BLADE 6.4 (BLADE) ×1 IMPLANT
ELECT COATED BLADE 2.86 ST (ELECTRODE) IMPLANT
ELECTRODE BLDE 4.0 EZ CLN MEGD (MISCELLANEOUS) ×1 IMPLANT
ELECTRODE REM PT RTRN 9FT ADLT (ELECTROSURGICAL) ×1 IMPLANT
GAUZE PAD ABD 8X10 STRL (GAUZE/BANDAGES/DRESSINGS) ×2 IMPLANT
GLOVE BIO SURGEON STRL SZ7.5 (GLOVE) ×1 IMPLANT
GLOVE BIO SURGEON STRL SZ8 (GLOVE) IMPLANT
GLOVE BIOGEL PI IND STRL 8 (GLOVE) ×1 IMPLANT
GOWN STRL REUS W/ TWL LRG LVL3 (GOWN DISPOSABLE) ×2 IMPLANT
GOWN STRL REUS W/TWL XL LVL3 (GOWN DISPOSABLE) ×1 IMPLANT
HEMOSTAT HEMOBLAST BELLOWS (HEMOSTASIS) IMPLANT
HYDROGEN PEROXIDE 16OZ (MISCELLANEOUS) ×1 IMPLANT
LEGGING LITHOTOMY PAIR STRL (DRAPES) ×1 IMPLANT
LINER CANISTER 1000CC FLEX (MISCELLANEOUS) ×1 IMPLANT
MARKER SKIN DUAL TIP RULER LAB (MISCELLANEOUS) ×1 IMPLANT
NEEDLE HYPO 22X1.5 SAFETY MO (MISCELLANEOUS) ×1 IMPLANT
PACK GENERAL/GYN (CUSTOM PROCEDURE TRAY) ×1 IMPLANT
PACK UNIVERSAL I (CUSTOM PROCEDURE TRAY) ×1 IMPLANT
SOLN 0.9% NACL POUR BTL 1000ML (IV SOLUTION) ×1 IMPLANT
SPONGE T-LAP 18X18 ~~LOC~~+RFID (SPONGE) IMPLANT
STAPLER SKIN PROX 35W (STAPLE) ×1 IMPLANT
STRIP CLOSURE SKIN 1/2X4 (GAUZE/BANDAGES/DRESSINGS) IMPLANT
SUT ETHILON 3 0 PS 1 (SUTURE) ×1 IMPLANT
SUT MNCRL AB 4-0 PS2 18 (SUTURE) IMPLANT
SUT MON AB 3-0 SH27 (SUTURE) ×2 IMPLANT
SUT PDS AB 2-0 CT2 27 (SUTURE) ×2 IMPLANT
SUT PDS AB 3-0 SH 27 (SUTURE) IMPLANT
SUT PLAIN GUT FAST 5-0 (SUTURE) ×1 IMPLANT
SUT VIC AB 0 CT1 18XCR BRD 8 (SUTURE) ×1 IMPLANT
SYR BULB EAR ULCER 3OZ GRN STR (SYRINGE) ×1 IMPLANT
SYR BULB IRRIG 60ML STRL (SYRINGE) ×1 IMPLANT
SYR CONTROL 10ML LL (SYRINGE) ×1 IMPLANT
TOWEL GREEN STERILE FF (TOWEL DISPOSABLE) ×2 IMPLANT
TRAY CATH INTERMITTENT SS 16FR (CATHETERS) IMPLANT
TUBE CONNECTING 20X1/4 (TUBING) ×1 IMPLANT
TUBING INFILTRATION IT-10001 (TUBING) ×1 IMPLANT
TUBING SET GRADUATE ASPIR 12FT (MISCELLANEOUS) ×1 IMPLANT

## 2024-05-25 NOTE — Transfer of Care (Signed)
 Immediate Anesthesia Transfer of Care Note  Patient: Deanna Friedman  Procedure(s) Performed: BREAST REDUCTION WITH LIPOSUCTION (Bilateral: Breast)  Patient Location: PACU  Anesthesia Type:General  Level of Consciousness: drowsy  Airway & Oxygen  Therapy: Patient Spontanous Breathing and Patient connected to nasal cannula oxygen   Post-op Assessment: Report given to RN and Post -op Vital signs reviewed and stable  Post vital signs: Reviewed and stable  Last Vitals:  Vitals Value Taken Time  BP 100/70 05/25/24 13:00  Temp    Pulse 71 05/25/24 13:05  Resp 14 05/25/24 13:05  SpO2 98 % 05/25/24 13:05  Vitals shown include unfiled device data.  Last Pain:  Vitals:   05/25/24 0616  TempSrc:   PainSc: 7       Patients Stated Pain Goal: 5 (05/25/24 0616)  Complications: No notable events documented.

## 2024-05-25 NOTE — Op Note (Signed)
 Operative Note   DATE OF OPERATION: 05/25/2024  LOCATION: [facility] Main OR  SURGICAL DEPARTMENT: Plastic Surgery  PREOPERATIVE DIAGNOSES:  Macromastia, Rash, Cervicalgia   POSTOPERATIVE DIAGNOSES:  same  PROCEDURE:  Bilateral breast reduction  SURGEON: Nieves Client, MD  ASSISTANT: Estefana Lake, PA  ANESTHESIA:  General.   COMPLICATIONS: None.   EBL<100 cc  Right breast  910 gr Left breast 1089 gr  INDICATIONS FOR PROCEDURE:  The patient, Deanna Friedman is a 49 y.o. female born on 1975-11-07, is here for treatment of macromastia, cervicalgia, rashes MRN: [id number]  CONSENT:  Informed consent was obtained directly from the patient. Risks, benefits and alternatives were fully discussed. Specific risks including but not limited to bleeding, infection, hematoma, seroma, scarring, pain, infection, contracture, asymmetry, wound healing problems, and need for further surgery were all discussed. The patient did have an ample opportunity to have questions answered to satisfaction.   DESCRIPTION OF PROCEDURE:  The patient was taken to the operating room. SCDs were placed and IV antibiotics were given. The patient's operative site was prepped and draped in a sterile fashion. A time out was performed and all information was confirmed to be correct.  General anesthesia was administered.    Right breast reduction   A 42 mm nipple marker was used to circumscribe the incision around the nipple-areola complex. An inferior pedicle measuring 9 centimeters in diameter and approximately 11 centimeters from the medial corner incision was de-epithelialized.  Tumescent solution was injected into the lateral chest wall and medial chest wall for hemostasis. The superior flaps were then elevated. They were initially 2 to 2.5 centimeters thick and gradually tapered down to the pectoralis fascia where dissection continued up to the clavicle. The medial, lateral, and superior triangles were removed, taking  care to spare some fatty tissue over the lateral chest wall to protect nipple-areola innervation.  A 0 vicryl suture was placed to medialize the pedicle and at the 12-o'clock position on the nipple.  Hemostasis was achieved using cautery and hemoblast. The breast was copiously irrigated with hydrogen peroxide  and Vashe (Two rounds). Valsalva maneuver was performed to check bleeders and was negative. The incisions were closed using a combination of 0 Vycryl, 3-0 PDS for the deep dermal layer and a running 3-0 Monoderm for the subcuticular layer.    Left Breast Reduction   A 42 mm nipple marker was used to circumscribe the incision around the nipple-areola complex. An inferior pedicle measuring 9 centimeters in diameter and approximately 11 centimeters from the medial corner incision was de-epithelialized.  Tumescent solution was injected into the lateral chest wall and medial chest wall for hemostasis. The superior flaps were then elevated. They were initially 2 to 2.5 centimeters thick and gradually tapered down to the pectoralis fascia where dissection continued up to the clavicle. The medial, lateral, and superior triangles were removed, taking care to spare some fatty tissue over the lateral chest wall to protect nipple-areola innervation.  A 0 vicryl suture was placed to medialize the pedicle and at the 12-o'clock position on the nipple.  Hemostasis was achieved using cautery and hemoblast. The breast was copiously irrigated with hydrogen peroxide  and Vashe (Two rounds). Valsalva maneuver was performed to check bleeders and was negative. The incisions were closed using a combination of 0 Vycryl, 3-0 PDS for the deep dermal layer and a running 3-0 Monoderm for the subcuticular layer.   The patient was then sat up. 38 mm nipple markers were used to mark the sites for the new  nipple-areola complexes, approximately 6 centimeters above the inframammary folds. Symmetry was based on measurements from the  suprasternal notch and from the midline in 2 planes. A 38 mm area was de-epithelialized bilaterally. The dermis was incised in a cruciate fashion. The nipple-areola complexes together with the inferior pedicles were then delivered in the resulting defects. Viability of the nipple-areola complexes was assessed as being normal. Interrupted 0 Vycrils were used to approximate the deep tissues. 3-0 PDS suture was then used to approximate the dermis. A running 3-0 Monocryl suture was used to close the nipples. Dermabond and Steri-Strips were used for the skin coaptation on all incisions. A sterile dressing was applied.   The patient tolerated the procedure well.  There were no complications. The patient was allowed to wake from anesthesia, extubated and taken to the recovery room in satisfactory condition.   The advanced practice practitioner (APP) assisted throughout the case.  The APP was essential in retraction and counter traction when needed to make the case progress smoothly.  This retraction and assistance made it possible to see the tissue plans for the procedure.  The assistance was needed for blood control, tissue re-approximation and assisted with closure of the incision site.  Natali Lavallee M. Kambre Messner, MD [facility]

## 2024-05-25 NOTE — Interval H&P Note (Signed)
 History and Physical Interval Note:  05/25/2024 6:51 AM  Deanna Friedman  has presented today for surgery, with the diagnosis of macromastia.  The various methods of treatment have been discussed with the patient and family. After consideration of risks, benefits and other options for treatment, the patient has consented to  Procedures: BREAST REDUCTION WITH LIPOSUCTION (Bilateral) as a surgical intervention.  The patient's history has been reviewed, patient examined, no change in status, stable for surgery.  I have reviewed the patient's chart and labs.  Questions were answered to the patient's satisfaction.     Duha Abair M Juniper Cobey

## 2024-05-25 NOTE — Anesthesia Procedure Notes (Signed)
 Procedure Name: Intubation Date/Time: 05/25/2024 7:47 AM  Performed by: Denton Niels CROME, CRNAPre-anesthesia Checklist: Patient identified, Emergency Drugs available, Suction available and Patient being monitored Patient Re-evaluated:Patient Re-evaluated prior to induction Oxygen  Delivery Method: Circle system utilized Preoxygenation: Pre-oxygenation with 100% oxygen  Induction Type: IV induction Ventilation: Mask ventilation without difficulty Laryngoscope Size: Mac and 3 Grade View: Grade I Tube type: Oral Tube size: 7.0 mm Number of attempts: 1 Airway Equipment and Method: Stylet Placement Confirmation: ETT inserted through vocal cords under direct vision, positive ETCO2 and breath sounds checked- equal and bilateral Secured at: 21.5 cm Tube secured with: Tape Dental Injury: Teeth and Oropharynx as per pre-operative assessment

## 2024-05-25 NOTE — Anesthesia Postprocedure Evaluation (Signed)
"   Anesthesia Post Note  Patient: Deanna Friedman  Procedure(s) Performed: BREAST REDUCTION WITH LIPOSUCTION (Bilateral: Breast)     Patient location during evaluation: PACU Anesthesia Type: General Level of consciousness: awake and alert and oriented Pain management: pain level controlled Vital Signs Assessment: post-procedure vital signs reviewed and stable Respiratory status: spontaneous breathing, nonlabored ventilation and respiratory function stable Cardiovascular status: blood pressure returned to baseline and stable Postop Assessment: no apparent nausea or vomiting Anesthetic complications: no   No notable events documented.  Last Vitals:  Vitals:   05/25/24 1400 05/25/24 1415  BP: 121/84 118/83  Pulse: 68 61  Resp: 11 14  Temp:  36.7 C  SpO2: 100% 99%    Last Pain:  Vitals:   05/25/24 1400  TempSrc:   PainSc: 4                  Mihran Lebarron A.      "

## 2024-05-25 NOTE — Discharge Instructions (Addendum)
 Activity (include date of return to work if known) As tolerated: NO showers for 48 hours NO driving No heavy activities  Diet:regular No restrictions  Wound Care: Keep dressing clean & dry  Special Instructions: Do not raise arms up Call Doctor if any unusual problems occur such as pain, excessive Bleeding, unrelieved Nausea/vomiting, Fever &/or chills When lying down, keep head elevated on 2-3 pillows or back-rest Follow-up appointment: Please call the office.  The patient received discharge instruction from:___________________________________________   Patient signature ________________________________________ / Date___________    Signature of individual providing instructions/ Date________________

## 2024-05-26 ENCOUNTER — Encounter (HOSPITAL_COMMUNITY): Payer: Self-pay

## 2024-05-27 LAB — SURGICAL PATHOLOGY

## 2024-06-03 ENCOUNTER — Ambulatory Visit (INDEPENDENT_AMBULATORY_CARE_PROVIDER_SITE_OTHER)

## 2024-06-03 VITALS — BP 141/87 | HR 81 | Ht 64.0 in | Wt 195.0 lb

## 2024-06-03 DIAGNOSIS — Z9889 Other specified postprocedural states: Secondary | ICD-10-CM

## 2024-06-03 DIAGNOSIS — N62 Hypertrophy of breast: Secondary | ICD-10-CM

## 2024-06-03 NOTE — Progress Notes (Signed)
" ° °  Established Patient Office Visit  Subjective   Patient ID: Deanna Friedman, female    DOB: Aug 08, 1975  Age: 49 y.o. MRN: [id number]  Chief Complaint  Patient presents with   post op    HPI  49 year old female status post breast reduction on 05/25/2024. Doing well after surgery, pain has been under control. No major issues reported.    Objective:     BP (!) 141/87 (BP Location: Left Arm, Patient Position: Sitting, Cuff Size: Normal)   Pulse 81   Ht 5' 4 (1.626 m)   Wt 195 lb (88.5 kg)   LMP  (LMP Unknown)   SpO2 100%   BMI 33.47 kg/m    Physical Exam MA as chaperone Breast incisions clean dry intact.  No discharge noticed.  No signs of infection.  Good symmetry.   FINAL MICROSCOPIC DIAGNOSIS:   A. RIGHT BREAST, REDUCTION MAMMOPLASTY:  Benign fibroadenoma and fibrocystic changes including stromal fibrosis,  cystic dilatation of ducts, apocrine metaplasia, adenosis and usual duct  hyperplasia  Pseudoangiomatous stromal hyperplasia (PASH)  Negative for atypia and carcinoma   B. LEFT BREAST, REDUCTION MAMMOPLASTY:  Benign breast showing fibrocystic changes including stromal fibrosis,  cystic dilatation of ducts, apocrine metaplasia, adenosis and usual duct  hyperplasia  Pseudoangiomatous stromal hyperplasia (PASH)  Negative for atypia and carcinoma     Assessment & Plan:   Problem List Items Addressed This Visit   None Visit Diagnoses       Macromastia    -  Primary     Status post breast reduction          Keep Steri-Strips in place, trim them as they start peeling off. Okay to wear sports bras.  No bras with underwire. Pathology results discussed.  No malignancy or atypia. Follow-up with PA in 1 month and with me in 3 months or earlier if any issues or concerns. All questions answered to patient's satisfaction.  Deanna Friedman M Krystian Ferrentino, MD  "

## 2024-06-18 ENCOUNTER — Encounter: Admitting: Student

## 2024-06-23 ENCOUNTER — Telehealth: Payer: Self-pay

## 2024-06-24 ENCOUNTER — Ambulatory Visit

## 2024-06-24 VITALS — BP 136/84 | HR 83

## 2024-06-24 DIAGNOSIS — Z9889 Other specified postprocedural states: Secondary | ICD-10-CM

## 2024-06-24 DIAGNOSIS — N62 Hypertrophy of breast: Secondary | ICD-10-CM

## 2024-06-24 NOTE — Progress Notes (Signed)
" ° °  Established Patient Office Visit  Subjective   Patient ID: Deanna Friedman, female    DOB: [birth year]  Age: 49 y.o. MRN: [id number]  No chief complaint on file.   HPI  49 year old female status post breast reduction on 05/25/2024.  Patient reports that she has had 2 falls since the incident.  One of them she landed on her hands and felt pressure in her chest.  On the other 1 she felt backwards.  The patient reports a foul odor emanating from the incision sites. She also notes an area beneath the left breast that is attempting to heal; however, the scab is repeatedly disrupted. The patient reports discomfort from a suture in this area and has been placing gauze over the site to minimize irritation.    Objective:     BP 136/84   Pulse 83   LMP  (LMP Unknown)   SpO2 99%  BP Readings from Last 3 Encounters:  06/24/24 136/84  06/03/24 (!) 141/87  05/25/24 118/83    Physical Exam MA as chaperone Incisions on bilateral breast look clean dry intact.  Steri-Strips the attaching from breast with bad odor, removed incisions underneath look clean dry intact.  I did appreciate a stitch on the left medial IMF I expressed to the patient would like to keep for now.  No signs of seroma hematoma or infection.  No results found for any visits on 06/24/24.    The 10-year ASCVD risk score (Arnett DK, et al., 2019) is: 2.3%    Assessment & Plan:   Problem List Items Addressed This Visit   None Visit Diagnoses       Macromastia    -  Primary     Status post breast reduction          Steri-Strips were removed today.  No major incision breakdown noticed.  Patient instructed to keep incisions clean at all times, clean the incisions with soap and water  and pat dry them after daily. No heavy lifting for another 2 weeks.  Instructed to follow-up with her PCP due to previous falls.  Continue follow-ups as scheduled.  All questions answered to patient satisfaction.  Deanna Friedman M Atiyah Bauer, MD  "

## 2024-07-03 ENCOUNTER — Encounter: Admitting: Student

## 2024-08-19 ENCOUNTER — Encounter
